# Patient Record
Sex: Male | Born: 1940 | Race: Black or African American | Hispanic: No | Marital: Married | Smoking: Former smoker
Health system: Southern US, Community
[De-identification: ages and names within clinical notes are randomized; demographics above are authoritative.]

## PROBLEM LIST (undated history)

## (undated) DIAGNOSIS — G309 Alzheimer's disease, unspecified: Secondary | ICD-10-CM

## (undated) DIAGNOSIS — F0280 Dementia in other diseases classified elsewhere without behavioral disturbance: Secondary | ICD-10-CM

## (undated) DIAGNOSIS — K219 Gastro-esophageal reflux disease without esophagitis: Secondary | ICD-10-CM

## (undated) DIAGNOSIS — N400 Enlarged prostate without lower urinary tract symptoms: Secondary | ICD-10-CM

## (undated) DIAGNOSIS — J189 Pneumonia, unspecified organism: Secondary | ICD-10-CM

## (undated) DIAGNOSIS — I82409 Acute embolism and thrombosis of unspecified deep veins of unspecified lower extremity: Secondary | ICD-10-CM

## (undated) DIAGNOSIS — J9601 Acute respiratory failure with hypoxia: Secondary | ICD-10-CM

## (undated) DIAGNOSIS — F329 Major depressive disorder, single episode, unspecified: Secondary | ICD-10-CM

## (undated) DIAGNOSIS — F32A Depression, unspecified: Secondary | ICD-10-CM

## (undated) DIAGNOSIS — I10 Essential (primary) hypertension: Secondary | ICD-10-CM

## (undated) HISTORY — PX: FRACTURE SURGERY: SHX138

## (undated) HISTORY — DX: Benign prostatic hyperplasia without lower urinary tract symptoms: N40.0

## (undated) HISTORY — DX: Major depressive disorder, single episode, unspecified: F32.9

## (undated) HISTORY — DX: Essential (primary) hypertension: I10

## (undated) HISTORY — PX: TRANSURETHRAL RESECTION OF PROSTATE: SHX73

## (undated) HISTORY — DX: Depression, unspecified: F32.A

---

## 1999-06-22 ENCOUNTER — Emergency Department (HOSPITAL_COMMUNITY): Admission: EM | Admit: 1999-06-22 | Discharge: 1999-06-22 | Payer: Self-pay | Admitting: *Deleted

## 2001-08-14 ENCOUNTER — Emergency Department (HOSPITAL_COMMUNITY): Admission: EM | Admit: 2001-08-14 | Discharge: 2001-08-14 | Payer: Self-pay | Admitting: Emergency Medicine

## 2002-01-05 ENCOUNTER — Ambulatory Visit (HOSPITAL_COMMUNITY): Admission: RE | Admit: 2002-01-05 | Discharge: 2002-01-05 | Payer: Self-pay | Admitting: *Deleted

## 2002-09-11 ENCOUNTER — Encounter: Admission: RE | Admit: 2002-09-11 | Discharge: 2002-09-11 | Payer: Self-pay | Admitting: Internal Medicine

## 2002-09-11 ENCOUNTER — Encounter: Payer: Self-pay | Admitting: Internal Medicine

## 2002-11-06 ENCOUNTER — Other Ambulatory Visit: Admission: RE | Admit: 2002-11-06 | Discharge: 2002-11-06 | Payer: Self-pay | Admitting: Urology

## 2002-11-06 ENCOUNTER — Encounter (INDEPENDENT_AMBULATORY_CARE_PROVIDER_SITE_OTHER): Payer: Self-pay | Admitting: *Deleted

## 2003-02-22 ENCOUNTER — Encounter: Admission: RE | Admit: 2003-02-22 | Discharge: 2003-02-22 | Payer: Self-pay | Admitting: Urology

## 2003-02-22 ENCOUNTER — Encounter: Payer: Self-pay | Admitting: Urology

## 2003-02-28 ENCOUNTER — Encounter (INDEPENDENT_AMBULATORY_CARE_PROVIDER_SITE_OTHER): Payer: Self-pay | Admitting: Specialist

## 2003-02-28 ENCOUNTER — Ambulatory Visit (HOSPITAL_BASED_OUTPATIENT_CLINIC_OR_DEPARTMENT_OTHER): Admission: RE | Admit: 2003-02-28 | Discharge: 2003-02-28 | Payer: Self-pay | Admitting: Urology

## 2007-09-21 ENCOUNTER — Emergency Department (HOSPITAL_COMMUNITY): Admission: EM | Admit: 2007-09-21 | Discharge: 2007-09-21 | Payer: Self-pay | Admitting: Emergency Medicine

## 2007-09-24 ENCOUNTER — Ambulatory Visit: Payer: Self-pay | Admitting: Internal Medicine

## 2007-09-24 ENCOUNTER — Inpatient Hospital Stay (HOSPITAL_COMMUNITY): Admission: EM | Admit: 2007-09-24 | Discharge: 2007-09-25 | Payer: Self-pay | Admitting: Emergency Medicine

## 2007-10-05 ENCOUNTER — Ambulatory Visit: Payer: Self-pay | Admitting: Hospitalist

## 2007-10-05 ENCOUNTER — Encounter (INDEPENDENT_AMBULATORY_CARE_PROVIDER_SITE_OTHER): Payer: Self-pay | Admitting: Infectious Diseases

## 2007-10-05 DIAGNOSIS — N400 Enlarged prostate without lower urinary tract symptoms: Secondary | ICD-10-CM | POA: Insufficient documentation

## 2007-10-05 DIAGNOSIS — F339 Major depressive disorder, recurrent, unspecified: Secondary | ICD-10-CM | POA: Insufficient documentation

## 2007-10-05 DIAGNOSIS — I10 Essential (primary) hypertension: Secondary | ICD-10-CM | POA: Insufficient documentation

## 2007-10-05 DIAGNOSIS — N3000 Acute cystitis without hematuria: Secondary | ICD-10-CM

## 2007-10-05 LAB — CONVERTED CEMR LAB
CO2: 24 meq/L (ref 19–32)
Calcium: 9 mg/dL (ref 8.4–10.5)
Sodium: 135 meq/L (ref 135–145)

## 2008-09-06 ENCOUNTER — Ambulatory Visit: Payer: Self-pay | Admitting: Surgery

## 2008-09-06 ENCOUNTER — Encounter (INDEPENDENT_AMBULATORY_CARE_PROVIDER_SITE_OTHER): Payer: Self-pay | Admitting: Emergency Medicine

## 2008-09-06 ENCOUNTER — Emergency Department (HOSPITAL_COMMUNITY): Admission: EM | Admit: 2008-09-06 | Discharge: 2008-09-06 | Payer: Self-pay | Admitting: Emergency Medicine

## 2010-05-18 ENCOUNTER — Emergency Department (HOSPITAL_COMMUNITY): Admission: EM | Admit: 2010-05-18 | Discharge: 2010-05-18 | Payer: Self-pay | Admitting: Emergency Medicine

## 2011-03-18 ENCOUNTER — Other Ambulatory Visit: Payer: Self-pay | Admitting: Dermatology

## 2011-05-01 NOTE — Op Note (Signed)
   NAME:  Iannelli, DHILLON COMUNALE                       ACCOUNT NO.:  [id number]   MEDICAL RECORD NO.:  [id number]                   PATIENT TYPE:  AMB   LOCATION:  [facility]                                 FACILITY:  [facility]   PHYSICIAN:  Rozanna Boer., M.D.      DATE OF BIRTH:  24-Feb-1941   DATE OF PROCEDURE:  02/28/2003  DATE OF DISCHARGE:                                 OPERATIVE REPORT   PREOPERATIVE DIAGNOSIS:  Hematuria with abnormal cytology and inflammatory  lesion, left bladder diverticulum.   POSTOPERATIVE DIAGNOSES:  1. Hematuria with abnormal cytology and inflammatory lesion, left bladder     diverticulum.  2. Mid-trigone posterior base inflammation.   PROCEDURE:  Transurethral resection of bladder tumor (2-3 cm left bladder  diverticulum), and fulguration of midposterior base inflammation.   ANESTHESIA:  General.   SURGEON:  Courtney Paris, M.D.   BRIEF HISTORY:  A 70 year old black male who was admitted with microscopic  hematuria and has had some pain in his groin off and on for some time.  He  has had some class II cytologies, on cystoscopy has a markedly inflamed  lesion inside a large bladder diverticulum on the left side of the bladder.  He enters to have this biopsied at this time.   DESCRIPTION OF PROCEDURE:  The patient was placed on the operating table in  the dorsal lithotomy position, prepped and draped with Betadine in the usual  sterile fashion.  Anterior urethra was inspected and no strictures seen.  Posterior urethra was nonobstructing.  The bladder was entered.  The bladder  did have an area in the midposterior base that was somewhat erythematous.  Pictures were made of this as well as the left-sided bladder diverticulum  that was just lateral to the left orifice.  There was some bullous edema  inside the lower lip of the diverticulum which was also photographed, as  well as the lesion in the posterior base.  Using the cold cup biopsy  forceps, five or six biopsies over about 2-3 cm were taken of this  diverticulum, and the area in the posterior base was then fulgurated as well  as the diverticulum.  Hemostasis appeared to be adequate, and a Foley  catheter was inserted, a #16 [place], and left to straight drainage when the  irrigant was clear.  The patient was then taken to the recovery room and  will be discharged with detailed written instructions as an outpatient.                                                Rozanna Boer., M.D.    HMK/MEDQ  D:  02/28/2003  T:  02/28/2003  Job:  [id number]

## 2011-05-01 NOTE — Procedures (Signed)
[place]  Patient:    DAEJON, LICH Visit Number: [id number] MRN: [id number]          Service Type: OUT Location: [facility] Attending Physician:  Enos Fling Dictated by:   Candy Sledge, M.D. Proc. Date: 01/05/02 Admit Date:  01/05/2002                             Procedure Report  PROCEDURE:  Lumbar puncture.  INDICATION:  Rule out Alzheimers disease.  DESCRIPTION OF PROCEDURE:  After sterile preparation of local anesthesia at the L2-3 interspace, a 20 gauge spinal needle was introduced without difficulty producing clear CSF at a opening pressure of 170 mm H20. Approximately 8 cc. were collected for routine studies with AB-42 and TAU- protein.  The patient tolerated the procedure well and without any immediate complications. Dictated by:   Candy Sledge, M.D. Attending Physician:  Enos Fling DD:  01/05/02 TD:  01/06/02 Job: [id number]

## 2011-05-01 NOTE — Discharge Summary (Signed)
NAME:  Christopher Summers, Christopher Summers NO.:  [id number]   MEDICAL RECORD NO.:  [id number]          PATIENT TYPE:  INP   LOCATION:  5742                         FACILITY:  [facility]   PHYSICIAN:  Madaline Guthrie, M.D.    DATE OF BIRTH:  03/29/1941   DATE OF ADMISSION:  09/24/2007  DATE OF DISCHARGE:  09/25/2007                               DISCHARGE SUMMARY   DISCHARGE DIAGNOSES:  1. Pyelonephritis.  2. Lower extremity arthralgia and myalgia in the left lower leg in      addition to the pain in the right elbow.  3. Altered mental status secondary to Ativan and fentanyl in the ED.  4. Alzheimer disease.  5. Benign prostatic hyperplasia.  6. Depression.  7. Hypertension.  8. resolving furuncle on the right gluteus maximus.   DISCHARGE MEDICATIONS:  1. Citalopram 20 mg p.o. daily.  2. Seroquel 25 mg twice daily.  3. Terazosin 5 mg daily.  4. Galantamine 8 mg 2 tablets once daily.  5. Amatine 10 mg once daily.  6. Lisinopril 5 mg once daily.  7. Bactrim DS 800/160 mg twice daily by mouth.  8. Ciprofloxacin 500 mg twice daily by mouth for 10 days.  9. Tylenol 500 mg one to two tablets by mouth every 4-6 hours as      needed.  10.Ibuprofen 400 mg p.o. every 8 hours as needed for pain.   HOSPITAL FOLLOWUP:  The patient will follow up at the [facility] and the clinic will call the patient with appointment  date.  It will be in the next 2 weeks.  He needs to be followed up for  his acute myalgias and arthralgias in addition to the resolution of his  UTI.   PROCEDURES PERFORMED:  1. Chest x-ray indicating clear lungs.  No pleural effusion and normal      heart size.  Four-view x-ray of the lumbar spine indicating      vertebral patent alignment, which are maintained.  Some end-plate      sclerosis at L1-L2 and L2-L3 anteriorly.  Pars anterior articular      defect noted.  2. Two view of the left ankle performed on September 25, 2007,      indicating no  obvious fractures or dislocations and soft tissues      within normal limits.  3. Two view of right wrist indicating no acute finding or injury      including fracture or dislocation.  Soft tissues are again      unremarkable.  4. Two view of the right elbow indicating small avulsion fragments      from the lateral epicondyle of intermediate age.   CONSULTATIONS:  None.   BRIEF ADMITTING HISTORY AND PHYSICAL:  This is a 70 year old male with  history of Alzheimer dementia and BPH, who comes in with a recent  history of fever to 101.7 in the last 24 hours. He also is complaining  of right upper extremity and left lower extremity pain of 3-4 days'  duration.  The patient and his wife are not sure how the pain started.  His wife indicates that she thinks that it occurred while he was  dragging and picking up logs in their backyard recently.  The right  upper quadrant pain is localized to his right upper arm and right wrist,  has not been associated with any recent swelling or obvious deformity.  His left lower extremity pain is localized to his left thigh and left  ankle and again it is not associated with any recent swelling or obvious  deformities to the left knee joint or ankle.  Additionally, the patient  has noticed recent fevers and night sweats in the last 2 days.  He  denies any chest pain, shortness of breath, coughing, nausea, or  vomiting.  No changes in his bowels or abdominal pain or urinary  frequency.  He has had occasional dysuria for the last few days.   ADMITTING PHYSICAL EXAMINATION:  VITAL SIGNS:  Temperature 98.6, blood  pressure 121/81, pulse 96, respirations 16, and satting 96% on room air.  GENERAL:  The patient is somnolent, but arousable to voice.  HEENT:  Oropharynx is clear of any erythema or exudates.  NECK:  Negative for JVD or cervical lymphadenopathy.  RESPIRATORY:  Lungs are clear bilaterally with no sensory muscle use.  CARDIOVASCULAR:  Regular rate and  rhythm.  A 2/6 diastolic murmur heard  best at the right upper sternal border.  GI:  Abdomen is soft, nontender, and nondistended.  Positive for bowel  sounds.  EXTREMITIES:  There is tenderness on the left lateral surface of the  knee, the left knee in addition to some tenderness on the left ankle.  There is no obvious swelling and range of motion is normal within these  joint.  SKIN:  Overlying the right gluteus maximus has a scaling rash that  appears to be an old furuncle.  EXTREMITIES:  Muscle strength +5 in all extremities.  NEUROLOGIC:  The patient is only oriented to name, not to time or place.  He is not following commands at this time.  DTRs are +2 in lower  extremities.  Babinski sign normal bilaterally.  Cranial nerves  difficult to assess because the patient is not currently following  commands.   ADMITTING LABORATORIES:  Sodium 133, potassium 3.9, chloride 95,  bicarbonate 27, BUN 12, creatinine 1.1, glucose 117.  Hemoglobin 12.7,  white count 10.0, platelets 333,000.  ANC 8.0, anion gap of 11.  LFTs  within normal limits.  UA indicating rbc's too numerous count, white  blood cells of 11 to 20, positive for leukocytes, and negative for  nitrites.   HOSPITAL COURSE BY PROBLEM:  1. Recent fevers.  The patient has evidence of UTI on UA.  His      leukocyte esterase is positive.  He also had rbc's on his UA.  He      has had recent fevers, but denies any recent chills, CVA      tenderness, or suprapubic tenderness.  He has had some recent      dysuria.  We placed the patient on ciprofloxacin 500 mg IV b.i.d.      and obtained a urine culture prior to that.  The urine culture      eventually came back as insignificant growth, but we continued      ciprofloxacin on an outpatient basis 500 mg p.o. twice daily for 10      days.  He will need to be followed up in the outpatient clinic for      resolution of  his UTI.  2. Muscle pain.  The patient again on musculoskeletal exam  does not      have any obvious deformities including swelling, signs of      dislocation, or open wounds.  It is unclear how the patient's right      elbow, right wrist of left lower extremity were injured.  He has      Alzheimer's and is unable to tell us exactly how it started, and      his wife was not present when he first started complaining of the      pain.  The pain is very localized so we do not feel this is related      to a systemic disease, which urged [place] to get some imaging of his      right elbow, right wrist, in addition to left ankle.  His right      elbow plain film did show a small avulsion fracture of his right      lateral epicondyle, and we recommended the patient use ibuprofen,      Tylenol, ice, and heat to control the pain in that elbow.  We also      recommended Tylenol and ibuprofen for the myalgias and arthralgias      that he has in his other joints as well.  He feels this is an acute      process probably related to some type of recent injury. He will      follow up with his primary care physician on an-outpatient basis      for resolution of these symptoms.  3. Altered mental status.  The patient was not following commands upon      admission.  He does have Alzheimer's, but he has some basic      functioning and ability to take care of his ADLs at home.  He was      given Ativan and fentanyl in the ED, and it is likely that these 2      medications in combination produced the altered mental status exam      that we saw on exam.  His mental status cleared up within 24 hours.      and he was able to converse with [place].  He was able to follow      commands and according to his wife it is at baseline towards mental      acuity.  4. Alzheimer disease.  The patient was on Namenda and galantamine      during his admission.  We feel that these are appropriate for his      Alzheimer disease.  He was also on Seroquel for recent agitation      and according his wife has  been working very well.  5. BPH.  The patient has an enlarged prostate and has been treated      with terazosin.  The patient's urinary output was normal during the      admission, and we do not feel that he is currently obstructed.  We      will continue the terazosin on an-outpatient basis.  6. Diastolic murmur.  The patient had 2/6 diastolic murmur best heard      at the right upper sternal border.  He needs probably a 2D echo in      the future and this should be arranged on an outpatient basis by      his primary care physician.  DISCHARGE VITALS:  Temperature 99.7, blood pressure 137-144/74-82, pulse  104-107, respirations 20, and satting 95-97% on room air.   DISCHARGE LABORATORIES:  White count 7.6, hemoglobin 10.1.  Sodium 134,  potassium 3.8, chloride 98, bicarb 26, BUN 10, creatinine 1.15, glucose  103, and calcium 8.9.      Lollie Sails, MD  Electronically Signed      Madaline Guthrie, M.D.  Electronically Signed    CB/MEDQ  D:  10/25/2007  T:  10/26/2007  Job:  [id number]   cc:   Mudis Dr. Kem Kays

## 2011-06-03 ENCOUNTER — Inpatient Hospital Stay (INDEPENDENT_AMBULATORY_CARE_PROVIDER_SITE_OTHER)
Admission: RE | Admit: 2011-06-03 | Discharge: 2011-06-03 | Disposition: A | Discharge status: Home / Self Care | Payer: Medicare Other | Source: Ambulatory Visit | Attending: Emergency Medicine | Admitting: Emergency Medicine

## 2011-06-03 DIAGNOSIS — H109 Unspecified conjunctivitis: Secondary | ICD-10-CM

## 2011-09-24 LAB — URINE MICROSCOPIC-ADD ON

## 2011-09-24 LAB — COMPREHENSIVE METABOLIC PANEL
ALT: 21
Alkaline Phosphatase: 71
BUN: 12
CO2: 27
Creatinine, Ser: 1.1
GFR calc Af Amer: >60
GFR calc non Af Amer: >60
Glucose, Bld: 117 — ABNORMAL HIGH
Potassium: 3.9
Total Bilirubin: 1.5 — ABNORMAL HIGH

## 2011-09-24 LAB — BASIC METABOLIC PANEL
BUN: 10
Calcium: 8.9
Chloride: 98
Creatinine, Ser: 1.15
GFR calc Af Amer: >60
GFR calc non Af Amer: >60
Potassium: 3.8

## 2011-09-24 LAB — CBC
HCT: 28.5 — ABNORMAL LOW
Hemoglobin: 10.1 — ABNORMAL LOW
Hemoglobin: 12.7 — ABNORMAL LOW
MCHC: 34.1
MCV: 91.6
MCV: 93.4
RDW: 13.2
RDW: 13.5
WBC: 10
WBC: 7.6

## 2011-09-24 LAB — LIPID PANEL
Cholesterol: 90
HDL: 25 — ABNORMAL LOW
LDL Cholesterol: 45
Triglycerides: 100

## 2011-09-24 LAB — URINE CULTURE: Colony Count: 6000

## 2011-09-24 LAB — URINALYSIS, ROUTINE W REFLEX MICROSCOPIC
Glucose, UA: NEGATIVE
Nitrite: NEGATIVE
Protein, ur: 30 — AB
Specific Gravity, Urine: 1.029
Urobilinogen, UA: 1

## 2011-09-24 LAB — DIFFERENTIAL
Basophils Absolute: 0
Basophils Relative: 0
Eosinophils Absolute: 0
Monocytes Absolute: 0.8 — ABNORMAL HIGH
Neutrophils Relative %: 80 — ABNORMAL HIGH

## 2011-09-24 LAB — CULTURE, BLOOD (ROUTINE X 2)

## 2012-01-29 ENCOUNTER — Ambulatory Visit (INDEPENDENT_AMBULATORY_CARE_PROVIDER_SITE_OTHER): Payer: Medicare Other | Admitting: Internal Medicine

## 2012-01-29 ENCOUNTER — Encounter: Payer: Self-pay | Admitting: Internal Medicine

## 2012-01-29 DIAGNOSIS — L03039 Cellulitis of unspecified toe: Secondary | ICD-10-CM

## 2012-01-29 DIAGNOSIS — L03031 Cellulitis of right toe: Secondary | ICD-10-CM

## 2012-01-29 DIAGNOSIS — L600 Ingrowing nail: Secondary | ICD-10-CM

## 2012-01-29 MED ORDER — CEPHALEXIN 500 MG PO CAPS: 500.0000 mg | ORAL_CAPSULE | Freq: Two times a day (BID) | ORAL | Status: AC

## 2012-01-29 MED ORDER — KETOCONAZOLE 2 % EX CREA: TOPICAL_CREAM | CUTANEOUS | Status: DC

## 2012-01-29 NOTE — Patient Instructions (Signed)
  Soak toe twice a day in saline. The saline can be purchased at the drugstore or you can make your own .Boil cup of salt in a gallon of water. Store mixture  in a clean container.Report Warning  signs as discussed (red streaks, pus, fever, increasing pain).  Nizoral applied topically and blown  dry with hair dryer after saline soaks twice a day.  

## 2012-01-29 NOTE — Progress Notes (Signed)
  Subjective:    Patient ID: Christopher Summers, male    DOB: Mar 21, 1941, 71 y.o.   MRN: [id number]  HPI Extremity pain Location:R great toe Onset:1 month Trigger/injury:no Pain quality:sore with direct pressure Pain severity:up to 1 Duration:consatant Radiation:no Exacerbating factors:none Treatment/response:none Review of systems: Constitutional: no fever, chills, sweats, change in weight  Musculoskeletal:no  muscle cramps or pain; no  joint stiffness, redness, or swelling Skin:no rash, color change    Review of Systems he denies diabetes       Objective:   Physical Exam   He appears adequately nourished and in no acute distress  Pedal pulses are intact.  He has no clubbing cyanosis or edema.  There is mild hyperpigmentation along the lateral nail of the right great toe with clinical mild onychomycosis and paronychia.        Assessment & Plan:    #1 paronychia  with mild onychomycosis  See recommendations and orders.

## 2012-12-25 ENCOUNTER — Encounter (HOSPITAL_COMMUNITY): Payer: Self-pay | Admitting: Emergency Medicine

## 2012-12-25 ENCOUNTER — Emergency Department (HOSPITAL_COMMUNITY)
Admission: EM | Admit: 2012-12-25 | Discharge: 2012-12-25 | Disposition: A | Discharge status: Home / Self Care | Payer: Medicare Other | Source: Home / Self Care | Attending: Emergency Medicine | Admitting: Emergency Medicine

## 2012-12-25 ENCOUNTER — Emergency Department (INDEPENDENT_AMBULATORY_CARE_PROVIDER_SITE_OTHER): Payer: Medicare Other

## 2012-12-25 DIAGNOSIS — L259 Unspecified contact dermatitis, unspecified cause: Secondary | ICD-10-CM

## 2012-12-25 DIAGNOSIS — M79672 Pain in left foot: Secondary | ICD-10-CM

## 2012-12-25 DIAGNOSIS — M79609 Pain in unspecified limb: Secondary | ICD-10-CM

## 2012-12-25 DIAGNOSIS — L0390 Cellulitis, unspecified: Secondary | ICD-10-CM

## 2012-12-25 MED ORDER — TRAMADOL HCL 50 MG PO TABS: 50.0000 mg | ORAL_TABLET | Freq: Four times a day (QID) | ORAL | Status: DC | PRN

## 2012-12-25 MED ORDER — ACETAMINOPHEN ER 650 MG PO TBCR: 650.0000 mg | EXTENDED_RELEASE_TABLET | Freq: Three times a day (TID) | ORAL | Status: DC | PRN

## 2012-12-25 NOTE — ED Provider Notes (Signed)
History     CSN: [id number]  Arrival date & time 12/25/12  1112   First MD Initiated Contact with Patient 12/25/12 1147      Chief Complaint  Patient presents with  . Foot Pain    acute left foot pain that started on friday. severe soreness felt on lateral and top/bottom of foot.     (Consider location/radiation/quality/duration/timing/severity/associated sxs/prior treatment) Patient is a 72 y.o. male presenting with lower extremity pain. The history is provided by the patient.  Foot Pain This is a new problem. The current episode started 2 days ago. The problem occurs daily. The problem has been gradually worsening. The symptoms are aggravated by walking. Nothing relieves the symptoms. He has tried nothing for the symptoms.   Christopher Summers is a 72 y.o. male who complains of left foot pain for 2 days.  No known injury, reports symptoms have been ongoing.  This morning patient reports unable to walk on foot without pain.  Denies prior history of related problems.  No medications taken for pain.    Past Medical History  Diagnosis Date  . BPH (benign prostatic hypertrophy)   . Depression   . Essential hypertension, benign     Past Surgical History  Procedure Date  . Transurethral resection of prostate     History reviewed. No pertinent family history.  History  Substance Use Topics  . Smoking status: Current Every Day Smoker -- 0.3 packs/day    Types: Cigarettes  . Smokeless tobacco: Not on file  . Alcohol Use: No      Review of Systems  Musculoskeletal: Positive for arthralgias.  All other systems reviewed and are negative.    Allergies  Niacin and related and Terazosin  Home Medications   Current Outpatient Rx  Name  Route  Sig  Dispense  Refill  . CITALOPRAM HYDROBROMIDE 40 MG PO TABS   Oral   Take 40 mg by mouth daily.         . DONEPEZIL HCL 10 MG PO TABS   Oral   Take 10 mg by mouth at bedtime as needed.         Marland Kitchen HYDROXYZINE HCL 10 MG PO  TABS   Oral   Take 10 mg by mouth 3 (three) times daily as needed.         Marland Kitchen MEMANTINE HCL 10 MG PO TABS   Oral   Take 10 mg by mouth daily.         Marland Kitchen RANITIDINE HCL 300 MG PO TABS   Oral   Take 300 mg by mouth at bedtime.         . ACETAMINOPHEN ER 650 MG PO TBCR   Oral   Take 1 tablet (650 mg total) by mouth every 8 (eight) hours as needed for pain.   30 tablet   0   . GALANTAMINE HYDROBROMIDE ER 24 MG PO CP24   Oral   Take 24 mg by mouth daily with breakfast.         . KETOCONAZOLE 2 % EX CREA      Apply bid & blow dry   15 g   0   . TRAMADOL HCL 50 MG PO TABS   Oral   Take 1 tablet (50 mg total) by mouth every 6 (six) hours as needed for pain.   30 tablet   0     BP 165/88  Pulse 73  Temp 97.5 F (36.4 C) (Oral)  SpO2  99%  Physical Exam  Nursing note and vitals reviewed. Constitutional: He is oriented to person, place, and time. Vital signs are normal. He appears well-developed and well-nourished. He is active and cooperative.  HENT:  Head: Normocephalic.  Eyes: Conjunctivae normal are normal. Pupils are equal, round, and reactive to light. No scleral icterus.  Neck: Trachea normal. Neck supple.  Cardiovascular: Normal rate and regular rhythm.   Pulmonary/Chest: Effort normal and breath sounds normal.  Musculoskeletal: Normal range of motion.       Left knee: Normal.       Right ankle: Normal.       Left ankle: Normal.       Left foot: He exhibits tenderness. He exhibits normal range of motion, no bony tenderness, no swelling, normal capillary refill, no crepitus, no deformity and no laceration.       Feet:       Tenderness on plantar surface of left foot.  Neurological: He is alert and oriented to person, place, and time. He has normal strength. No cranial nerve deficit or sensory deficit. Coordination and gait normal. GCS eye subscore is 4. GCS verbal subscore is 5. GCS motor subscore is 6.  Skin: Skin is warm and dry.  Psychiatric: He has a  normal mood and affect. His speech is normal and behavior is normal. Judgment and thought content normal. Cognition and memory are normal.    ED Course  Procedures (including critical care time)  Labs Reviewed - No data to display Dg Foot Complete Left  12/25/2012  *RADIOLOGY REPORT*  Clinical Data: Foot pain  LEFT FOOT - COMPLETE 3+ VIEW  Comparison: None.  Findings: Normal alignment without acute fracture.  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  Preserved joint spaces.  No definite soft tissue abnormality.  Slight degenerative changes of the left first MTP joint.  IMPRESSION: No acute osseous finding.  Chronic changes as described.   Original Report Authenticated By: Judie Petit. Shick, M.D.      1. Foot pain, left       MDM  Ace wrap, replace shoes, tylenol arthritis as needed for pain.  Ultram for worse pain.          Johnsie Kindred, NP 12/25/12 1221

## 2012-12-25 NOTE — ED Notes (Signed)
Pt c/o acute left foot pain that started on Friday. Severe soreness is felt on the lateral side of foot up into the ankle and pain in bottom center of foot and top side. Pt denies injury.   Pt hx borderline diabetic.

## 2012-12-25 NOTE — ED Provider Notes (Signed)
Medical screening examination/treatment/procedure(s) were performed by non-physician practitioner and as supervising physician I was immediately available for consultation/collaboration.  Leslee Home, M.D.   Reuben Likes, MD 12/25/12 [phone]

## 2013-01-05 ENCOUNTER — Encounter (HOSPITAL_COMMUNITY): Payer: Self-pay | Admitting: *Deleted

## 2013-01-05 ENCOUNTER — Emergency Department (HOSPITAL_COMMUNITY)
Admission: EM | Admit: 2013-01-05 | Discharge: 2013-01-05 | Disposition: A | Discharge status: Home / Self Care | Payer: Medicare Other | Source: Home / Self Care | Attending: Emergency Medicine | Admitting: Emergency Medicine

## 2013-01-05 DIAGNOSIS — L309 Dermatitis, unspecified: Secondary | ICD-10-CM

## 2013-01-05 DIAGNOSIS — L0390 Cellulitis, unspecified: Secondary | ICD-10-CM

## 2013-01-05 DIAGNOSIS — L259 Unspecified contact dermatitis, unspecified cause: Secondary | ICD-10-CM

## 2013-01-05 DIAGNOSIS — L0291 Cutaneous abscess, unspecified: Secondary | ICD-10-CM

## 2013-01-05 MED ORDER — TRIAMCINOLONE ACETONIDE 0.1 % EX CREA: TOPICAL_CREAM | Freq: Three times a day (TID) | CUTANEOUS | Status: DC

## 2013-01-05 MED ORDER — MUPIROCIN 2 % EX OINT: TOPICAL_OINTMENT | Freq: Three times a day (TID) | CUTANEOUS | Status: DC

## 2013-01-05 MED ORDER — CEPHALEXIN 500 MG PO CAPS: 500.0000 mg | ORAL_CAPSULE | Freq: Three times a day (TID) | ORAL | Status: DC

## 2013-01-05 NOTE — ED Provider Notes (Addendum)
Chief Complaint  Patient presents with  . Wound Infection    History of Present Illness:   Christopher Summers is a 72 year old male with Alzheimer's disease who was normally followed at the [facility]. He has a history of a rash on his legs which has been diagnosed by Dr. Janalyn Harder as being eczema. It's been scaly and he's been itching at it continuously. For the past 4 days there has been redness and swelling over the right pretibial surface. He denies any fever or chills. There's been no drainage, numbness, tingling, or weakness of the leg.  Review of Systems:  Other than noted above, the patient denies any of the following symptoms: Systemic:  No fevers, chills, sweats, or aches.  No fatigue or tiredness. Musculoskeletal:  No joint pain, arthritis, bursitis, swelling, back pain, or neck pain. Neurological:  No muscular weakness, paresthesias, headache, or trouble with speech or coordination.  No dizziness.  PMFSH:  Past medical history, family history, social history, meds, and allergies were reviewed.  Physical Exam:   Vital signs:  BP 162/80  Pulse 58  Temp 97.9 F (36.6 C) (Oral)  Resp 16  SpO2 96% Gen:  Alert and oriented times 3.  In no distress. Musculoskeletal: He has a plaque of eczematous is area on his right pretibial surface. At the superior end of this there was an area of erythema measuring 3 x 3 cm in diameter. This was very tender to palpation but not fluctuant and not draining any pus.  Otherwise, all joints had a full a ROM with no swelling, bruising or deformity.  No edema, pulses full. Extremities were warm and pink.  Capillary refill was brisk.  Skin:  Clear, warm and dry.  No rash. Neuro:  Alert and oriented times 3.  Muscle strength was normal.  Sensation was intact to light touch.   Assessment:  The primary encounter diagnosis was Cellulitis. A diagnosis of Eczema was also pertinent to this visit.  Plan:   1.  The following meds were prescribed:   New  Prescriptions   CEPHALEXIN (KEFLEX) 500 MG CAPSULE    Take 1 capsule (500 mg total) by mouth 3 (three) times daily.   MUPIROCIN OINTMENT (BACTROBAN) 2 %    Apply topically 3 (three) times daily.   TRIAMCINOLONE CREAM (KENALOG) 0.1 %    Apply topically 3 (three) times daily.   2.  The patient was instructed in symptomatic care, including rest and activity, elevation, application of ice and compression.  Appropriate handouts were given. 3.  The patient was told to return if becoming worse in any way, if no better in 3 or 4 days, and given some red flag symptoms that would indicate earlier return.   4.  The patient was told to follow up in 48 hours if no improvement.    Reuben Likes, MD 01/05/13 1612  Reuben Likes, MD 01/08/13 [phone]

## 2013-01-05 NOTE — ED Notes (Signed)
Pt is here with complaints of right shin wound.  Caregiver reports pt has been being treated for this by a Dermatologist for some time and was provided with an unknown cream.  Caregiver reports wound appears increasingly red and swollen.

## 2014-06-25 ENCOUNTER — Emergency Department (HOSPITAL_COMMUNITY)
Admission: EM | Admit: 2014-06-25 | Discharge: 2014-06-25 | Disposition: A | Discharge status: Home / Self Care | Payer: Medicare Other | Source: Home / Self Care | Attending: Emergency Medicine | Admitting: Emergency Medicine

## 2014-06-25 ENCOUNTER — Encounter (HOSPITAL_COMMUNITY): Payer: Self-pay | Admitting: Emergency Medicine

## 2014-06-25 DIAGNOSIS — L280 Lichen simplex chronicus: Secondary | ICD-10-CM

## 2014-06-25 MED ORDER — TRIAMCINOLONE ACETONIDE 0.1 % EX CREA
1.0000 | TOPICAL_CREAM | Freq: Three times a day (TID) | CUTANEOUS | Status: DC
Start: 2014-06-25 — End: 2014-10-08

## 2014-06-25 MED ORDER — PREDNISONE 20 MG PO TABS: 20.0000 mg | ORAL_TABLET | Freq: Two times a day (BID) | ORAL | Status: DC

## 2014-06-25 MED ORDER — CEPHALEXIN 500 MG PO CAPS: 500.0000 mg | ORAL_CAPSULE | Freq: Three times a day (TID) | ORAL | Status: DC

## 2014-06-25 MED ORDER — METHYLPREDNISOLONE ACETATE 80 MG/ML IJ SUSP
INTRAMUSCULAR | Status: AC
  Filled 2014-06-25: qty 1

## 2014-06-25 MED ORDER — METHYLPREDNISOLONE ACETATE 80 MG/ML IJ SUSP
80.0000 mg | Freq: Once | INTRAMUSCULAR | Status: AC
  Administered 2014-06-25: 80 mg via INTRAMUSCULAR

## 2014-06-25 MED ORDER — HYDROXYZINE HCL 10 MG PO TABS
10.0000 mg | ORAL_TABLET | Freq: Three times a day (TID) | ORAL | Status: DC | PRN
Start: 2014-06-25 — End: 2014-10-08

## 2014-06-25 NOTE — Discharge Instructions (Signed)
Eczema has been described as "the itch that rashes."  The primary problem is inflammation of the skin, this is followed by the irresistible urge to scratch.  The scratching is what causes the rash.   ° °Eczema is thought to be hereditary.  It is often accompanied by other inflammatory diseases such as asthma or hay fever.  There are certain environmental things that may make it worse such as dryness of the skin, wool clothing, infection, certain allergens, and stress.  It is important to recognize that it is not caused by stress, and the search for an allergic cause is not helpful. ° °While there is no cure for eczema, it can be controlled with prescription medication and lifestyle changes. Suggestions follow for successful control of eczema: ° °· Avoid harsh soaps.  Use Dove, Cetaphil, Neutrogena, Aveeno.  Do not use Ivory. °· Take infrequent baths or showers, use luke warm water.  Get in and out of the bath or shower as quickly as possible.  Do not scrub the skin. Pat the skin dry after bathing.   °· Immediately after bathing apply a skin moisturizer such as Aquaphor, Vaseline, Eucerin, Cetaphil, Nutraderm, or Nivea. These should be applied 2 times a day, immediately after bathing and after all hand washing.  °· Use unscented laundry detergent such as Cheer-free, All-free, or unscented Bounce since these have no perfumes or preservatives.  °· Keep thermostat as low as possible in winter.  Consider a humidifier in bedroom.   °· Avoid wool clothing, blended or synthetic fabric or shirt collar tags--use  100% cotton clothing instead.  °· Avoid scratching.  Try using an ice cube on  Itchy area.  °· May use antihistamines such as Benadryl, Zyrtec, Allegra, or Claritin for itching.  °· Treat skin infections immediately. °· Eczema is a chronic and recurring condition.  You should be followed by a primary care doctor or a dermatology specialist or both.  ° ° ° °

## 2014-06-25 NOTE — ED Notes (Signed)
Wife brings in demented spouse who has had problem w skin bilateral lower legs x 1 year + ; usually goes to the [place] for his medical issues

## 2014-06-25 NOTE — ED Provider Notes (Signed)
  Chief Complaint    Chief Complaint  Patient presents with  . Skin Problem    History of Present Illness      Christopher Summers is a 73 year old male with Alzheimer's disease who has a two-year history of irritated areas on his left medial knee and right pretibial surface. He states these are itchy and he scratches at them. His wife thinks that the scratching makes them worse. She can't get him to stop scratching at them. She has tried creams and antihistamine pills which help for a while, but the skin lesions always seem to come back again.  Review of Systems   Other than as noted above, the patient denies any of the following symptoms: Systemic:  No fever or chills. ENT:  No nasal congestion, rhinorrhea, sore throat, swelling of lips, tongue or throat. Resp:  No cough, wheezing, or shortness of breath.  [place]    Past medical history, family history, social history, meds, and allergies were reviewed.   Physical Exam     Vital signs:  BP 158/82  Pulse 62  Temp(Src) 98 F (36.7 C) (Oral)  Resp 14  SpO2 95% Gen:  Alert, oriented, in no distress. ENT:  Pharynx clear, no intraoral lesions, moist mucous membranes. Lungs:  Clear to auscultation. Skin:  He has 2 large eczematous area, one on the medial left knee and one on the pretibial surface. These do not appear to be infected.  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  Plan     1.  Meds:  The following meds were prescribed:   Discharge Medication List as of 06/25/2014 10:39 AM    START taking these medications   Details  !! cephALEXin (KEFLEX) 500 MG capsule Take 1 capsule (500 mg total) by mouth 3 (three) times daily., Starting 06/25/2014, Until Discontinued, Normal    !! hydrOXYzine (ATARAX/VISTARIL) 10 MG tablet  Take 1 tablet (10 mg total) by mouth 3 (three) times daily as needed., Starting 06/25/2014, Until Discontinued, Normal    predniSONE (DELTASONE) 20 MG tablet Take 1 tablet (20 mg total) by mouth 2 (two) times daily., Starting 06/25/2014, Until Discontinued, Normal    !! triamcinolone cream (KENALOG) 0.1 % Apply 1 application topically 3 (three) times daily., Starting 06/25/2014, Until Discontinued, Normal     !! - Potential duplicate medications found. Please discuss with provider.      2.  Patient Education/Counseling:  The patient was given appropriate handouts, self care instructions, and instructed in symptomatic relief.  He will need followup with dermatology.  3.  Follow up:  The patient was told to follow up here if no better in 3 to 4 days, or sooner if becoming worse in any way, and given some red flag symptoms such as worsening rash, fever, or difficulty breathing which would prompt immediate return.  Follow up here if necessary.      Harden Mo, MD 06/25/14 1452

## 2014-10-08 ENCOUNTER — Inpatient Hospital Stay (HOSPITAL_COMMUNITY)
Admission: EM | Admit: 2014-10-08 | Discharge: 2014-10-11 | DRG: 981 | Disposition: A | Discharge status: Skilled Nursing Facility | Payer: Medicare Other | Attending: Internal Medicine | Admitting: Internal Medicine

## 2014-10-08 ENCOUNTER — Emergency Department (HOSPITAL_COMMUNITY): Payer: Medicare Other

## 2014-10-08 ENCOUNTER — Encounter (HOSPITAL_COMMUNITY): Payer: Self-pay | Admitting: Emergency Medicine

## 2014-10-08 DIAGNOSIS — F17210 Nicotine dependence, cigarettes, uncomplicated: Secondary | ICD-10-CM | POA: Diagnosis present

## 2014-10-08 DIAGNOSIS — Z888 Allergy status to other drugs, medicaments and biological substances status: Secondary | ICD-10-CM

## 2014-10-08 DIAGNOSIS — M25559 Pain in unspecified hip: Secondary | ICD-10-CM

## 2014-10-08 DIAGNOSIS — W11XXXA Fall on and from ladder, initial encounter: Secondary | ICD-10-CM | POA: Diagnosis present

## 2014-10-08 DIAGNOSIS — S72001A Fracture of unspecified part of neck of right femur, initial encounter for closed fracture: Secondary | ICD-10-CM | POA: Diagnosis not present

## 2014-10-08 DIAGNOSIS — Z01818 Encounter for other preprocedural examination: Secondary | ICD-10-CM

## 2014-10-08 DIAGNOSIS — G309 Alzheimer's disease, unspecified: Secondary | ICD-10-CM | POA: Diagnosis not present

## 2014-10-08 DIAGNOSIS — W19XXXA Unspecified fall, initial encounter: Secondary | ICD-10-CM

## 2014-10-08 DIAGNOSIS — S72009A Fracture of unspecified part of neck of unspecified femur, initial encounter for closed fracture: Secondary | ICD-10-CM

## 2014-10-08 DIAGNOSIS — D62 Acute posthemorrhagic anemia: Secondary | ICD-10-CM | POA: Diagnosis not present

## 2014-10-08 DIAGNOSIS — F329 Major depressive disorder, single episode, unspecified: Secondary | ICD-10-CM | POA: Diagnosis present

## 2014-10-08 DIAGNOSIS — N400 Enlarged prostate without lower urinary tract symptoms: Principal | ICD-10-CM | POA: Diagnosis present

## 2014-10-08 DIAGNOSIS — F0391 Unspecified dementia with behavioral disturbance: Secondary | ICD-10-CM

## 2014-10-08 DIAGNOSIS — F02818 Dementia in other diseases classified elsewhere, unspecified severity, with other behavioral disturbance: Secondary | ICD-10-CM | POA: Diagnosis present

## 2014-10-08 DIAGNOSIS — M25551 Pain in right hip: Secondary | ICD-10-CM | POA: Diagnosis present

## 2014-10-08 DIAGNOSIS — I10 Essential (primary) hypertension: Secondary | ICD-10-CM | POA: Diagnosis present

## 2014-10-08 DIAGNOSIS — F0281 Dementia in other diseases classified elsewhere with behavioral disturbance: Secondary | ICD-10-CM | POA: Diagnosis present

## 2014-10-08 DIAGNOSIS — S0990XA Unspecified injury of head, initial encounter: Secondary | ICD-10-CM

## 2014-10-08 DIAGNOSIS — F03918 Unspecified dementia, unspecified severity, with other behavioral disturbance: Secondary | ICD-10-CM

## 2014-10-08 DIAGNOSIS — F0280 Dementia in other diseases classified elsewhere without behavioral disturbance: Secondary | ICD-10-CM | POA: Diagnosis present

## 2014-10-08 DIAGNOSIS — W19XXXD Unspecified fall, subsequent encounter: Secondary | ICD-10-CM

## 2014-10-08 DIAGNOSIS — Z9889 Other specified postprocedural states: Secondary | ICD-10-CM

## 2014-10-08 LAB — CBC WITH DIFFERENTIAL/PLATELET
BASOS ABS: 0 10*3/uL (ref 0.0–0.1)
BASOS PCT: 0 % (ref 0–1)
Eosinophils Absolute: 0 10*3/uL (ref 0.0–0.7)
Eosinophils Relative: 0 % (ref 0–5)
HCT: 42.9 % (ref 39.0–52.0)
HEMOGLOBIN: 13.9 g/dL (ref 13.0–17.0)
Lymphocytes Relative: 11 % — ABNORMAL LOW (ref 12–46)
Lymphs Abs: 1 10*3/uL (ref 0.7–4.0)
MCH: 29.7 pg (ref 26.0–34.0)
MCHC: 32.4 g/dL (ref 30.0–36.0)
MCV: 91.7 fL (ref 78.0–100.0)
Monocytes Absolute: 0.6 10*3/uL (ref 0.1–1.0)
Monocytes Relative: 6 % (ref 3–12)
NEUTROS ABS: 7.8 10*3/uL — AB (ref 1.7–7.7)
NEUTROS PCT: 83 % — AB (ref 43–77)
Platelets: 228 10*3/uL (ref 150–400)
RBC: 4.68 MIL/uL (ref 4.22–5.81)
RDW: 13.2 % (ref 11.5–15.5)
WBC: 9.5 10*3/uL (ref 4.0–10.5)

## 2014-10-08 LAB — PROTIME-INR
INR: 1.02 (ref 0.00–1.49)
Prothrombin Time: 13.5 s (ref 11.6–15.2)

## 2014-10-08 LAB — I-STAT CHEM 8, ED
BUN: 10 mg/dL (ref 6–23)
CHLORIDE: 103 meq/L (ref 96–112)
Calcium, Ion: 1.21 mmol/L (ref 1.13–1.30)
Creatinine, Ser: 1 mg/dL (ref 0.50–1.35)
Glucose, Bld: 108 mg/dL — ABNORMAL HIGH (ref 70–99)
HEMATOCRIT: 47 % (ref 39.0–52.0)
Hemoglobin: 16 g/dL (ref 13.0–17.0)
POTASSIUM: 3.8 meq/L (ref 3.7–5.3)
SODIUM: 142 meq/L (ref 137–147)
TCO2: 24 mmol/L (ref 0–100)

## 2014-10-08 LAB — VALPROIC ACID LEVEL

## 2014-10-08 LAB — ABO/RH: ABO/RH(D): A POS

## 2014-10-08 LAB — TYPE AND SCREEN
ABO/RH(D): A POS
ANTIBODY SCREEN: NEGATIVE

## 2014-10-08 MED ORDER — CEFAZOLIN SODIUM-DEXTROSE 2-3 GM-% IV SOLR
2.0000 g | INTRAVENOUS | Status: AC
  Administered 2014-10-09: 2 g via INTRAVENOUS
  Filled 2014-10-08: qty 50

## 2014-10-08 MED ORDER — DIVALPROEX SODIUM ER 250 MG PO TB24
250.0000 mg | ORAL_TABLET | Freq: Every day | ORAL | Status: DC
  Administered 2014-10-08 – 2014-10-09 (×3): 250 mg via ORAL
  Filled 2014-10-08 (×4): qty 1

## 2014-10-08 MED ORDER — DEXTROSE-NACL 5-0.45 % IV SOLN
INTRAVENOUS | Status: AC
  Administered 2014-10-08: 20:00:00 via INTRAVENOUS

## 2014-10-08 MED ORDER — MEMANTINE HCL 10 MG PO TABS
10.0000 mg | ORAL_TABLET | Freq: Every day | ORAL | Status: DC
  Administered 2014-10-10 – 2014-10-11 (×2): 10 mg via ORAL
  Filled 2014-10-08 (×3): qty 1

## 2014-10-08 MED ORDER — HALOPERIDOL 2 MG PO TABS
2.0000 mg | ORAL_TABLET | Freq: Once | ORAL | Status: AC
  Administered 2014-10-08: 2 mg via ORAL
  Filled 2014-10-08: qty 1

## 2014-10-08 MED ORDER — SERTRALINE HCL 50 MG PO TABS
50.0000 mg | ORAL_TABLET | Freq: Every day | ORAL | Status: DC
  Administered 2014-10-10 – 2014-10-11 (×2): 50 mg via ORAL
  Filled 2014-10-08 (×2): qty 1

## 2014-10-08 MED ORDER — DONEPEZIL HCL 10 MG PO TABS
10.0000 mg | ORAL_TABLET | Freq: Every day | ORAL | Status: DC
  Administered 2014-10-08 – 2014-10-10 (×3): 10 mg via ORAL
  Filled 2014-10-08 (×3): qty 1

## 2014-10-08 MED ORDER — HALOPERIDOL LACTATE 5 MG/ML IJ SOLN
2.5000 mg | Freq: Four times a day (QID) | INTRAMUSCULAR | Status: DC | PRN
  Administered 2014-10-08: 2.5 mg via INTRAVENOUS
  Filled 2014-10-08: qty 1

## 2014-10-08 MED ORDER — FENTANYL CITRATE 0.05 MG/ML IJ SOLN
50.0000 ug | INTRAMUSCULAR | Status: DC | PRN
  Administered 2014-10-08: 50 ug via INTRAVENOUS
  Filled 2014-10-08: qty 2

## 2014-10-08 MED ORDER — CHLORHEXIDINE GLUCONATE 4 % EX LIQD
60.0000 mL | Freq: Once | CUTANEOUS | Status: AC
  Administered 2014-10-08: 4 via TOPICAL
  Filled 2014-10-08: qty 60

## 2014-10-08 MED ORDER — DOCUSATE SODIUM 100 MG PO CAPS
100.0000 mg | ORAL_CAPSULE | Freq: Two times a day (BID) | ORAL | Status: DC
  Administered 2014-10-08: 100 mg via ORAL
  Filled 2014-10-08: qty 1

## 2014-10-08 MED ORDER — FENTANYL CITRATE 0.05 MG/ML IJ SOLN
50.0000 ug | Freq: Once | INTRAMUSCULAR | Status: AC
  Administered 2014-10-08: 50 ug via INTRAVENOUS
  Filled 2014-10-08: qty 2

## 2014-10-08 MED ORDER — MORPHINE SULFATE 2 MG/ML IJ SOLN
0.5000 mg | INTRAMUSCULAR | Status: DC | PRN
  Administered 2014-10-08 – 2014-10-10 (×2): 0.5 mg via INTRAVENOUS
  Filled 2014-10-08 (×2): qty 1

## 2014-10-08 MED ORDER — ONDANSETRON HCL 4 MG/2ML IJ SOLN: 4.0000 mg | Freq: Once | INTRAMUSCULAR | Status: DC

## 2014-10-08 MED ORDER — HYDROCODONE-ACETAMINOPHEN 5-325 MG PO TABS
1.0000 | ORAL_TABLET | Freq: Four times a day (QID) | ORAL | Status: DC | PRN
  Administered 2014-10-10 (×2): 1 via ORAL
  Filled 2014-10-08 (×2): qty 1

## 2014-10-08 NOTE — ED Notes (Signed)
PT INSTRUCTED AND EXPLAINED NPO STATUS

## 2014-10-08 NOTE — ED Notes (Signed)
unwitnessed fall onto ground from a ladder pta at care facility pt with advanced dementia pt does not recall

## 2014-10-08 NOTE — ED Notes (Signed)
Obtained consent from pts wife for right hemiarthroplasty after ortho md at bedside talking with pt and spouse

## 2014-10-08 NOTE — ED Notes (Signed)
PER [facility] PT LAST ATE AND DRANK AT 0930 TODAY

## 2014-10-08 NOTE — ED Provider Notes (Signed)
CSN: [id number]     Arrival date & time 10/08/14  1300 History   First MD Initiated Contact with Patient 10/08/14 1319     Chief Complaint  Patient presents with  . Fall     (Consider location/radiation/quality/duration/timing/severity/associated sxs/prior Treatment) HPI Comments: Patient presents after sustaining a fall. He is brought in by EMS. His wife states that she has a history of dementia. He is normally confused. He has baseline mental status now. He was climbing up a ladder to blow leaves off of the roof. She reports that he fell off the ladder. She didn't actually witness the fall but she was right near the patient and was there right after he fell. He had no loss of consciousness. He's been complaining of pain to his right hip and leg. She states she still acting his baseline mental status. He's not on anticoagulants. He fell about 5-7 feet onto a dirt surface.  Patient is a 73 y.o. male presenting with fall.  Fall    Past Medical History  Diagnosis Date  . BPH (benign prostatic hypertrophy)   . Depression   . Essential hypertension, benign   . Reflux   . Dementia    Past Surgical History  Procedure Laterality Date  . Transurethral resection of prostate     History reviewed. No pertinent family history. History  Substance Use Topics  . Smoking status: Current Every Day Smoker -- 0.30 packs/day    Types: Cigarettes  . Smokeless tobacco: Not on file  . Alcohol Use: No    Review of Systems  Unable to perform ROS: Dementia      Allergies  Niacin and related and Terazosin  Home Medications   Prior to Admission medications   Medication Sig Start Date End Date Taking? Authorizing Provider  divalproex (DEPAKOTE ER) 250 MG 24 hr tablet Take 250 mg by mouth daily after lunch.   Yes Historical Provider, MD  donepezil (ARICEPT) 10 MG tablet Take 10 mg by mouth at bedtime.    Yes Historical Provider, MD  memantine (NAMENDA) 10 MG tablet Take 10 mg by mouth daily.    Yes Historical Provider, MD  sertraline (ZOLOFT) 50 MG tablet Take 50 mg by mouth daily.   Yes Historical Provider, MD  vitamin B-12 (CYANOCOBALAMIN) 100 MCG tablet Take 100 mcg by mouth daily as needed (for vitamin).   Yes Historical Provider, MD   BP 145/66  Pulse 72  Temp(Src) 97.7 F (36.5 C) (Oral)  Resp 18  SpO2 97% Physical Exam  Constitutional: He appears well-developed and well-nourished.  HENT:  Head: Normocephalic and atraumatic.  Eyes: Pupils are equal, round, and reactive to light.  Neck: Normal range of motion. Neck supple.  No tenderness to the cervical thoracic or lumbosacral spine.   Cardiovascular: Normal rate, regular rhythm and normal heart sounds.   Pulmonary/Chest: Effort normal and breath sounds normal. No respiratory distress. He has no wheezes. He has no rales. He exhibits no tenderness.  Abdominal: Soft. Bowel sounds are normal. There is no tenderness. There is no rebound and no guarding.  Musculoskeletal: Normal range of motion. He exhibits no edema.  Positive tenderness on palpation and range of motion of the right hip in the right mid thigh. There is no swelling or deformity noted. There is no pain to the knee or ankle. Distal pulses are intact. He has no other pain on palpation or range of motion of the extremities.  Lymphadenopathy:    He has no cervical adenopathy.  Neurological: He is alert.  Patient is confused but answers questions and is alert. He moves all actually symmetrically other than the right leg which she is protecting due to discomfort. He has baseline mental status per the wife.  Skin: Skin is warm and dry. No rash noted.  Psychiatric: He has a normal mood and affect.    ED Course  Procedures (including critical care time) Labs Review Labs Reviewed  CBC WITH DIFFERENTIAL - Abnormal; Notable for the following:    Neutrophils Relative % 83 (*)    Neutro Abs 7.8 (*)    Lymphocytes Relative 11 (*)    All other components within normal  limits  I-STAT CHEM 8, ED - Abnormal; Notable for the following:    Glucose, Bld 108 (*)    All other components within normal limits  PROTIME-INR  TYPE AND SCREEN    Imaging Review Dg Chest 1 View  10/08/2014   CLINICAL DATA:  [place] off a ladder.  Mental status change.  EXAM: CHEST - 1 VIEW  COMPARISON:  05/18/2010  FINDINGS: The cardiac silhouette, mediastinal and hilar contours are within normal limits. There is tortuosity and calcification of the thoracic aorta. The lungs are clear. No pleural effusion. The bony thorax is intact.  IMPRESSION: No acute cardiopulmonary findings and intact bony thorax.   Electronically Signed   By: Kalman Jewels M.D.   On: 10/08/2014 14:41   Dg Hip Complete Right  10/08/2014   CLINICAL DATA:  [place] off ladder today.  Right hip pain  EXAM: RIGHT HIP - COMPLETE 2+ VIEW  COMPARISON:  None.  FINDINGS: Right femoral neck fracture. No significant displacement. Right hip joint is normal.  IMPRESSION: Nondisplaced fracture right femoral neck.   Electronically Signed   By: Franchot Gallo M.D.   On: 10/08/2014 14:35   Dg Femur Right  10/08/2014   CLINICAL DATA:  [place] off ladder today  EXAM: RIGHT FEMUR - 2 VIEW  COMPARISON:  Right hip from today  FINDINGS: Right femoral neck fracture. No other fracture of the femur. The knee joint appears normal.  IMPRESSION: Femoral neck fracture.   Electronically Signed   By: Franchot Gallo M.D.   On: 10/08/2014 14:36   Ct Head Wo Contrast  10/08/2014   CLINICAL DATA:  Unwitnessed fall.  Patient found on ground.  EXAM: CT HEAD WITHOUT CONTRAST  CT CERVICAL SPINE WITHOUT CONTRAST  TECHNIQUE: Multidetector CT imaging of the head and cervical spine was performed following the standard protocol without intravenous contrast. Multiplanar CT image reconstructions of the cervical spine were also generated.  COMPARISON:  05/18/2010 -CT C spine  FINDINGS: CT HEAD FINDINGS  There is no evidence of mass effect, midline shift, or extra-axial  fluid collections. There is no evidence of a space-occupying lesion or intracranial hemorrhage. There is no evidence of a cortical-based area of acute infarction. There is generalized cerebral atrophy. There is periventricular white matter low attenuation likely secondary to microangiopathy.  The ventricles and sulci are appropriate for the patient's age. The basal cisterns are patent.  Visualized portions of the orbits are unremarkable. There is mild right sphenoid sinus mucosal thickening. Cerebrovascular atherosclerotic calcifications are noted.  The osseous structures are unremarkable.  CT CERVICAL SPINE FINDINGS  The alignment is anatomic. The vertebral body heights are maintained. Incidental note is made of incomplete fusion of the posterior arch of C1. There is no acute fracture. There is no static listhesis. The prevertebral soft tissues are normal. The intraspinal soft tissues  are not fully imaged on this examination due to poor soft tissue contrast, but there is no gross soft tissue abnormality.  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 There is degenerative disc disease at C3-4, C4-5 C6-7. Bilateral uncovertebral degenerative changes at C3-4. Mild broad-based disc bulge at C4-5. Mild broad-based disc osteophyte complex at C5-6 with bilateral uncovertebral degenerative changes resulting in mild left foraminal encroachment. Mild broad-based disc bulge at C6-7 with bilateral uncovertebral degenerative changes.  The visualized portions of the lung apices demonstrate no focal abnormality.  IMPRESSION: 1. No acute intracranial pathology. 2. No acute osseous injury of the cervical spine.   Electronically Signed   By: Kathreen Devoid   On: 10/08/2014 14:49   Ct Cervical Spine Wo Contrast  10/08/2014   CLINICAL DATA:  Unwitnessed fall.  Patient found on ground.  EXAM: CT HEAD WITHOUT CONTRAST  CT CERVICAL SPINE WITHOUT CONTRAST  TECHNIQUE: Multidetector CT imaging of the head and cervical spine was  performed following the standard protocol without intravenous contrast. Multiplanar CT image reconstructions of the cervical spine were also generated.  COMPARISON:  05/18/2010 -CT C spine  FINDINGS: CT HEAD FINDINGS  There is no evidence of mass effect, midline shift, or extra-axial fluid collections. There is no evidence of a space-occupying lesion or intracranial hemorrhage. There is no evidence of a cortical-based area of acute infarction. There is generalized cerebral atrophy. There is periventricular white matter low attenuation likely secondary to microangiopathy.  The ventricles and sulci are appropriate for the patient's age. The basal cisterns are patent.  Visualized portions of the orbits are unremarkable. There is mild right sphenoid sinus mucosal thickening. Cerebrovascular atherosclerotic calcifications are noted.  The osseous structures are unremarkable.  CT CERVICAL SPINE FINDINGS  The alignment is anatomic. The vertebral body heights are maintained. Incidental note is made of incomplete fusion of the posterior arch of C1. There is no acute fracture. There is no static listhesis. The prevertebral soft tissues are normal. The intraspinal soft tissues are not fully imaged on this examination due to poor soft tissue contrast, but there is no gross soft tissue abnormality.  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 There is degenerative disc disease at C3-4, C4-5 C6-7. Bilateral uncovertebral degenerative changes at C3-4. Mild broad-based disc bulge at C4-5. Mild broad-based disc osteophyte complex at C5-6 with bilateral uncovertebral degenerative changes resulting in mild left foraminal encroachment. Mild broad-based disc bulge at C6-7 with bilateral uncovertebral degenerative changes.  The visualized portions of the lung apices demonstrate no focal abnormality.  IMPRESSION: 1. No acute intracranial pathology. 2. No acute osseous injury of the cervical spine.   Electronically Signed   By:  Kathreen Devoid   On: 10/08/2014 14:49     EKG Interpretation None      MDM   Final diagnoses:  Head injury  Fall  Hip pain  Hip fracture requiring operative repair, right, closed, initial encounter    Spoke with Dr. Ronnie Derby who will repair hip, will admit to [facility].    Malvin Johns, MD 10/08/14 [id number]

## 2014-10-08 NOTE — ED Notes (Signed)
Pt incont urine staff cleaned pt and changed linens

## 2014-10-08 NOTE — ED Notes (Signed)
Myself and Lanice Schwab., RN changed pt for pt had spilled the urinal on himself while using it with help from his family; stretcher linens and a clean dry gown were placed on pt; warm blanket given; family at bedside

## 2014-10-08 NOTE — ED Notes (Signed)
pts spouse informed pt will go to or tomorrow per call received from Dr. Ronnie Derby

## 2014-10-08 NOTE — ED Notes (Signed)
MD at bedside. 

## 2014-10-08 NOTE — ED Notes (Signed)
Distal cms intact to right leg

## 2014-10-08 NOTE — ED Notes (Signed)
Spoke with Dr. [place] Pink about pt.  PO medication to be ordered.

## 2014-10-08 NOTE — H&P (Addendum)
[facility] History and Physical  Christopher Summers ENI:[id number] DOB: 1941-07-03 DOA: 10/08/2014   PCP: Followed by the [facility] system in [place] Specialists: None  Chief Complaint: Fall  HPI: Christopher Summers is a 73 y.o. male with a past medical history of Alzheimer's dementia who was in his usual state of health until earlier today when he was climbing a ladder to blow leaves off of his roof. Subsequently he fell off the ladder after losing balance. There was no loss of consciousness. Patient was complaining of pain in his right hip and leg. Patient has advanced Alzheimers dementia and doesn't recall the event. He denies any pain at this time. History is not available from the patient. His wife provided most of the information. Patient's wife denies any recent illness or sickness. No recent fever or chills. No nausea, vomiting. No recent changes to his medications.  Home Medications: Prior to Admission medications   Medication Sig Start Date End Date Taking? Authorizing Provider  divalproex (DEPAKOTE ER) 250 MG 24 hr tablet Take 250 mg by mouth daily after lunch.   Yes Historical Provider, MD  donepezil (ARICEPT) 10 MG tablet Take 10 mg by mouth at bedtime.    Yes Historical Provider, MD  memantine (NAMENDA) 10 MG tablet Take 10 mg by mouth daily.   Yes Historical Provider, MD  sertraline (ZOLOFT) 50 MG tablet Take 50 mg by mouth daily.   Yes Historical Provider, MD  vitamin B-12 (CYANOCOBALAMIN) 100 MCG tablet Take 100 mcg by mouth daily as needed (for vitamin).   Yes Historical Provider, MD    Allergies:  Allergies  Allergen Reactions  . Niacin And Related Other (See Comments)    Turned red all over  . Terazosin     Past Medical History: Past Medical History  Diagnosis Date  . BPH (benign prostatic hypertrophy)   . Depression   . Essential hypertension, benign   . Reflux   . Dementia     Past Surgical History  Procedure Laterality Date  . Transurethral  resection of prostate      Social History: Lives with his wife. Smokes half a pack of cigarettes on a daily basis. No alcohol use. No history of recreational drug use. He is quite active at home. But the has difficulties performing IADLs. No living will or advanced directives.  Family History:  Family History  Problem Relation Age of Onset  . Heart disease Brother      Review of Systems - unable to obtain due to his dementia.  Physical Examination  Filed Vitals:   10/08/14 1345 10/08/14 1359 10/08/14 1545 10/08/14 1600  BP: 146/83 146/83 151/101 145/66  Pulse: 57 57 68 72  Temp:      TempSrc:      Resp:      SpO2: 98% 100% 97% 97%    BP 145/66  Pulse 72  Temp(Src) 97.7 F (36.5 C) (Oral)  Resp 18  SpO2 97%  General appearance: alert, distracted and no distress Head: Normocephalic, without obvious abnormality, atraumatic Eyes: conjunctivae/corneas clear. PERRL, EOM's intact.  Throat: lips, mucosa, and tongue normal; teeth and gums normal Neck: no adenopathy, no carotid bruit, no JVD, supple, symmetrical, trachea midline and thyroid not enlarged, symmetric, no tenderness/mass/nodules Resp: clear to auscultation bilaterally Cardio: regular rate and rhythm, S1, S2 normal, no murmur, click, rub or gallop GI: soft, non-tender; bowel sounds normal; no masses,  no organomegaly Extremities: Right lower extremity is externally rotated. Pulses: 2+ and symmetric Skin: Skin  color, texture, turgor normal. No rashes or lesions Lymph nodes: Cervical, supraclavicular, and axillary nodes normal. Neurologic: Alert. Confused. No facial asymmetry. Tongue is midline. Strength is 5 out of 5 bilateral upper extremities. No weakness in the left leg. Right leg has limited range of motion due to hip fracture. Gait not assessed.  Laboratory Data: Results for orders placed during the hospital encounter of 10/08/14 (from the past 48 hour(s))  CBC WITH DIFFERENTIAL     Status: Abnormal   Collection  Time    10/08/14  2:55 PM      Result Value Ref Range   WBC 9.5  4.0 - 10.5 K/uL   RBC 4.68  4.22 - 5.81 MIL/uL   Hemoglobin 13.9  13.0 - 17.0 g/dL   HCT 42.9  39.0 - 52.0 %   MCV 91.7  78.0 - 100.0 fL   MCH 29.7  26.0 - 34.0 pg   MCHC 32.4  30.0 - 36.0 g/dL   RDW 13.2  11.5 - 15.5 %   Platelets 228  150 - 400 K/uL   Neutrophils Relative % 83 (*) 43 - 77 %   Neutro Abs 7.8 (*) 1.7 - 7.7 K/uL   Lymphocytes Relative 11 (*) 12 - 46 %   Lymphs Abs 1.0  0.7 - 4.0 K/uL   Monocytes Relative 6  3 - 12 %   Monocytes Absolute 0.6  0.1 - 1.0 K/uL   Eosinophils Relative 0  0 - 5 %   Eosinophils Absolute 0.0  0.0 - 0.7 K/uL   Basophils Relative 0  0 - 1 %   Basophils Absolute 0.0  0.0 - 0.1 K/uL  I-STAT CHEM 8, ED     Status: Abnormal   Collection Time    10/08/14  3:04 PM      Result Value Ref Range   Sodium 142  137 - 147 mEq/L   Potassium 3.8  3.7 - 5.3 mEq/L   Chloride 103  96 - 112 mEq/L   BUN 10  6 - 23 mg/dL   Creatinine, Ser 1.00  0.50 - 1.35 mg/dL   Glucose, Bld 108 (*) 70 - 99 mg/dL   Calcium, Ion 1.21  1.13 - 1.30 mmol/L   TCO2 24  0 - 100 mmol/L   Hemoglobin 16.0  13.0 - 17.0 g/dL   HCT 47.0  39.0 - 52.0 %    Radiology Reports: Dg Chest 1 View  10/08/2014   CLINICAL DATA:  [place] off a ladder.  Mental status change.  EXAM: CHEST - 1 VIEW  COMPARISON:  05/18/2010  FINDINGS: The cardiac silhouette, mediastinal and hilar contours are within normal limits. There is tortuosity and calcification of the thoracic aorta. The lungs are clear. No pleural effusion. The bony thorax is intact.  IMPRESSION: No acute cardiopulmonary findings and intact bony thorax.   Electronically Signed   By: Kalman Jewels M.D.   On: 10/08/2014 14:41   Dg Hip Complete Right  10/08/2014   CLINICAL DATA:  [place] off ladder today.  Right hip pain  EXAM: RIGHT HIP - COMPLETE 2+ VIEW  COMPARISON:  None.  FINDINGS: Right femoral neck fracture. No significant displacement. Right hip joint is normal.  IMPRESSION:  Nondisplaced fracture right femoral neck.   Electronically Signed   By: Franchot Gallo M.D.   On: 10/08/2014 14:35   Dg Femur Right  10/08/2014   CLINICAL DATA:  [place] off ladder today  EXAM: RIGHT FEMUR - 2 VIEW  COMPARISON:  Right hip from today  FINDINGS: Right femoral neck fracture. No other fracture of the femur. The knee joint appears normal.  IMPRESSION: Femoral neck fracture.   Electronically Signed   By: Franchot Gallo M.D.   On: 10/08/2014 14:36   Ct Head Wo Contrast  10/08/2014   CLINICAL DATA:  Unwitnessed fall.  Patient found on ground.  EXAM: CT HEAD WITHOUT CONTRAST  CT CERVICAL SPINE WITHOUT CONTRAST  TECHNIQUE: Multidetector CT imaging of the head and cervical spine was performed following the standard protocol without intravenous contrast. Multiplanar CT image reconstructions of the cervical spine were also generated.  COMPARISON:  05/18/2010 -CT C spine  FINDINGS: CT HEAD FINDINGS  There is no evidence of mass effect, midline shift, or extra-axial fluid collections. There is no evidence of a space-occupying lesion or intracranial hemorrhage. There is no evidence of a cortical-based area of acute infarction. There is generalized cerebral atrophy. There is periventricular white matter low attenuation likely secondary to microangiopathy.  The ventricles and sulci are appropriate for the patient's age. The basal cisterns are patent.  Visualized portions of the orbits are unremarkable. There is mild right sphenoid sinus mucosal thickening. Cerebrovascular atherosclerotic calcifications are noted.  The osseous structures are unremarkable.  CT CERVICAL SPINE FINDINGS  The alignment is anatomic. The vertebral body heights are maintained. Incidental note is made of incomplete fusion of the posterior arch of C1. There is no acute fracture. There is no static listhesis. The prevertebral soft tissues are normal. The intraspinal soft tissues are not fully imaged on this examination due to poor soft  tissue contrast, but there is no gross soft tissue abnormality.  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 There is degenerative disc disease at C3-4, C4-5 C6-7. Bilateral uncovertebral degenerative changes at C3-4. Mild broad-based disc bulge at C4-5. Mild broad-based disc osteophyte complex at C5-6 with bilateral uncovertebral degenerative changes resulting in mild left foraminal encroachment. Mild broad-based disc bulge at C6-7 with bilateral uncovertebral degenerative changes.  The visualized portions of the lung apices demonstrate no focal abnormality.  IMPRESSION: 1. No acute intracranial pathology. 2. No acute osseous injury of the cervical spine.   Electronically Signed   By: Kathreen Devoid   On: 10/08/2014 14:49   Ct Cervical Spine Wo Contrast  10/08/2014   CLINICAL DATA:  Unwitnessed fall.  Patient found on ground.  EXAM: CT HEAD WITHOUT CONTRAST  CT CERVICAL SPINE WITHOUT CONTRAST  TECHNIQUE: Multidetector CT imaging of the head and cervical spine was performed following the standard protocol without intravenous contrast. Multiplanar CT image reconstructions of the cervical spine were also generated.  COMPARISON:  05/18/2010 -CT C spine  FINDINGS: CT HEAD FINDINGS  There is no evidence of mass effect, midline shift, or extra-axial fluid collections. There is no evidence of a space-occupying lesion or intracranial hemorrhage. There is no evidence of a cortical-based area of acute infarction. There is generalized cerebral atrophy. There is periventricular white matter low attenuation likely secondary to microangiopathy.  The ventricles and sulci are appropriate for the patient's age. The basal cisterns are patent.  Visualized portions of the orbits are unremarkable. There is mild right sphenoid sinus mucosal thickening. Cerebrovascular atherosclerotic calcifications are noted.  The osseous structures are unremarkable.  CT CERVICAL SPINE FINDINGS  The alignment is anatomic. The vertebral body  heights are maintained. Incidental note is made of incomplete fusion of the posterior arch of C1. There is no acute fracture. There is no static listhesis. The prevertebral soft tissues  are normal. The intraspinal soft tissues are not fully imaged on this examination due to poor soft tissue contrast, but there is no gross soft tissue abnormality.  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 There is degenerative disc disease at C3-4, C4-5 C6-7. Bilateral uncovertebral degenerative changes at C3-4. Mild broad-based disc bulge at C4-5. Mild broad-based disc osteophyte complex at C5-6 with bilateral uncovertebral degenerative changes resulting in mild left foraminal encroachment. Mild broad-based disc bulge at C6-7 with bilateral uncovertebral degenerative changes.  The visualized portions of the lung apices demonstrate no focal abnormality.  IMPRESSION: 1. No acute intracranial pathology. 2. No acute osseous injury of the cervical spine.   Electronically Signed   By: Kathreen Devoid   On: 10/08/2014 14:49    Electrocardiogram: Sinus rhythm at 61 bpm. Appears to be normal axis. No Q waves. Intervals are normal. No concerning ST changes. Nonspecific T-wave changes.  Problem List  Principal Problem:   Hip fracture, right Active Problems:   Alzheimer's dementia   Assessment: This is a 73 year old African-American male with a past medical history of Alzheimer's dementia who had a mechanical fall resulting in right hip fracture. He otherwise does not have any significant health problems.   Plan: #1 Right hip fracture/preoperative evaluation: Patient has good functional status. EKG does not show any concerning changes. He may proceed to surgery without further cardiac testing. Pain control for hip fracture. Orthopedics has been consulted and they will take him to surgery either later today or tomorrow.  #2 History of Alzheimer's dementia: Mental status is at baseline per family. Continue with his home  medications. Check valproic acid level. Patient may experience agitation and transient worsening of his confusion after anesthesia. I have explained this to the family. Having a familiar face near him may be helpful, so family will try to be around him. Avoid benzodiazepines. Haldol as needed.   DVT Prophylaxis: SCDs for now. Definitive prophylaxis after surgery and per orthopedics Code Status: Discussed with the patient's family. He is a full code Family Communication: Discussed with the patient's wife and son  Disposition Plan: Admit to MedSurg   Further management decisions will depend on results of further testing and patient's response to treatment.   [facility]  [facility] Pager [phone]  If 7PM-7AM, please contact night-coverage [email or web address] Password [facility]  10/08/2014, 4:28 PM

## 2014-10-08 NOTE — Consult Note (Signed)
Reason for Consult:Right Hip Pain Referring Physician: ED MD  Christopher Summers is an 73 y.o. male.  HPI: Pt fell of ladder  Past Medical History  Diagnosis Date  . BPH (benign prostatic hypertrophy)   . Depression   . Essential hypertension, benign   . Reflux   . Dementia     Past Surgical History  Procedure Laterality Date  . Transurethral resection of prostate      Family History  Problem Relation Age of Onset  . Heart disease Brother     Social History:  reports that he has been smoking Cigarettes.  He has been smoking about 0.50 packs per day. He does not have any smokeless tobacco history on file. He reports that he does not drink alcohol or use illicit drugs.  Allergies:  Allergies  Allergen Reactions  . Niacin And Related Other (See Comments)    Turned red all over  . Terazosin     Medications: I have reviewed the patient's current medications.  Results for orders placed during the hospital encounter of 10/08/14 (from the past 48 hour(s))  CBC WITH DIFFERENTIAL     Status: Abnormal   Collection Time    10/08/14  2:55 PM      Result Value Ref Range   WBC 9.5  4.0 - 10.5 K/uL   RBC 4.68  4.22 - 5.81 MIL/uL   Hemoglobin 13.9  13.0 - 17.0 g/dL   HCT 42.9  39.0 - 52.0 %   MCV 91.7  78.0 - 100.0 fL   MCH 29.7  26.0 - 34.0 pg   MCHC 32.4  30.0 - 36.0 g/dL   RDW 13.2  11.5 - 15.5 %   Platelets 228  150 - 400 K/uL   Neutrophils Relative % 83 (*) 43 - 77 %   Neutro Abs 7.8 (*) 1.7 - 7.7 K/uL   Lymphocytes Relative 11 (*) 12 - 46 %   Lymphs Abs 1.0  0.7 - 4.0 K/uL   Monocytes Relative 6  3 - 12 %   Monocytes Absolute 0.6  0.1 - 1.0 K/uL   Eosinophils Relative 0  0 - 5 %   Eosinophils Absolute 0.0  0.0 - 0.7 K/uL   Basophils Relative 0  0 - 1 %   Basophils Absolute 0.0  0.0 - 0.1 K/uL  I-STAT CHEM 8, ED     Status: Abnormal   Collection Time    10/08/14  3:04 PM      Result Value Ref Range   Sodium 142  137 - 147 mEq/L   Potassium 3.8  3.7 - 5.3 mEq/L    Chloride 103  96 - 112 mEq/L   BUN 10  6 - 23 mg/dL   Creatinine, Ser 1.00  0.50 - 1.35 mg/dL   Glucose, Bld 108 (*) 70 - 99 mg/dL   Calcium, Ion 1.21  1.13 - 1.30 mmol/L   TCO2 24  0 - 100 mmol/L   Hemoglobin 16.0  13.0 - 17.0 g/dL   HCT 47.0  39.0 - 52.0 %  TYPE AND SCREEN     Status: None   Collection Time    10/08/14  4:37 PM      Result Value Ref Range   ABO/RH(D) A POS     Antibody Screen NEG     Sample Expiration 10/11/2014    PROTIME-INR     Status: None   Collection Time    10/08/14  4:38 PM  Result Value Ref Range   Prothrombin Time 13.5  11.6 - 15.2 seconds   INR 1.02  0.00 - 1.49  VALPROIC ACID LEVEL     Status: Abnormal   Collection Time    10/08/14  4:38 PM      Result Value Ref Range   Valproic Acid Lvl <10.0 (*) 50.0 - 100.0 ug/mL    Dg Chest 1 View  10/08/2014   CLINICAL DATA:  [place] off a ladder.  Mental status change.  EXAM: CHEST - 1 VIEW  COMPARISON:  05/18/2010  FINDINGS: The cardiac silhouette, mediastinal and hilar contours are within normal limits. There is tortuosity and calcification of the thoracic aorta. The lungs are clear. No pleural effusion. The bony thorax is intact.  IMPRESSION: No acute cardiopulmonary findings and intact bony thorax.   Electronically Signed   By: Kalman Jewels M.D.   On: 10/08/2014 14:41   Dg Hip Complete Right  10/08/2014   CLINICAL DATA:  [place] off ladder today.  Right hip pain  EXAM: RIGHT HIP - COMPLETE 2+ VIEW  COMPARISON:  None.  FINDINGS: Right femoral neck fracture. No significant displacement. Right hip joint is normal.  IMPRESSION: Nondisplaced fracture right femoral neck.   Electronically Signed   By: Franchot Gallo M.D.   On: 10/08/2014 14:35   Dg Femur Right  10/08/2014   CLINICAL DATA:  [place] off ladder today  EXAM: RIGHT FEMUR - 2 VIEW  COMPARISON:  Right hip from today  FINDINGS: Right femoral neck fracture. No other fracture of the femur. The knee joint appears normal.  IMPRESSION: Femoral neck fracture.    Electronically Signed   By: Franchot Gallo M.D.   On: 10/08/2014 14:36   Ct Head Wo Contrast  10/08/2014   CLINICAL DATA:  Unwitnessed fall.  Patient found on ground.  EXAM: CT HEAD WITHOUT CONTRAST  CT CERVICAL SPINE WITHOUT CONTRAST  TECHNIQUE: Multidetector CT imaging of the head and cervical spine was performed following the standard protocol without intravenous contrast. Multiplanar CT image reconstructions of the cervical spine were also generated.  COMPARISON:  05/18/2010 -CT C spine  FINDINGS: CT HEAD FINDINGS  There is no evidence of mass effect, midline shift, or extra-axial fluid collections. There is no evidence of a space-occupying lesion or intracranial hemorrhage. There is no evidence of a cortical-based area of acute infarction. There is generalized cerebral atrophy. There is periventricular white matter low attenuation likely secondary to microangiopathy.  The ventricles and sulci are appropriate for the patient's age. The basal cisterns are patent.  Visualized portions of the orbits are unremarkable. There is mild right sphenoid sinus mucosal thickening. Cerebrovascular atherosclerotic calcifications are noted.  The osseous structures are unremarkable.  CT CERVICAL SPINE FINDINGS  The alignment is anatomic. The vertebral body heights are maintained. Incidental note is made of incomplete fusion of the posterior arch of C1. There is no acute fracture. There is no static listhesis. The prevertebral soft tissues are normal. The intraspinal soft tissues are not fully imaged on this examination due to poor soft tissue contrast, but there is no gross soft tissue abnormality.  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 There is degenerative disc disease at C3-4, C4-5 C6-7. Bilateral uncovertebral degenerative changes at C3-4. Mild broad-based disc bulge at C4-5. Mild broad-based disc osteophyte complex at C5-6 with bilateral uncovertebral degenerative changes resulting in mild left foraminal  encroachment. Mild broad-based disc bulge at C6-7 with bilateral uncovertebral degenerative changes.  The visualized portions of the  lung apices demonstrate no focal abnormality.  IMPRESSION: 1. No acute intracranial pathology. 2. No acute osseous injury of the cervical spine.   Electronically Signed   By: Kathreen Devoid   On: 10/08/2014 14:49   Ct Cervical Spine Wo Contrast  10/08/2014   CLINICAL DATA:  Unwitnessed fall.  Patient found on ground.  EXAM: CT HEAD WITHOUT CONTRAST  CT CERVICAL SPINE WITHOUT CONTRAST  TECHNIQUE: Multidetector CT imaging of the head and cervical spine was performed following the standard protocol without intravenous contrast. Multiplanar CT image reconstructions of the cervical spine were also generated.  COMPARISON:  05/18/2010 -CT C spine  FINDINGS: CT HEAD FINDINGS  There is no evidence of mass effect, midline shift, or extra-axial fluid collections. There is no evidence of a space-occupying lesion or intracranial hemorrhage. There is no evidence of a cortical-based area of acute infarction. There is generalized cerebral atrophy. There is periventricular white matter low attenuation likely secondary to microangiopathy.  The ventricles and sulci are appropriate for the patient's age. The basal cisterns are patent.  Visualized portions of the orbits are unremarkable. There is mild right sphenoid sinus mucosal thickening. Cerebrovascular atherosclerotic calcifications are noted.  The osseous structures are unremarkable.  CT CERVICAL SPINE FINDINGS  The alignment is anatomic. The vertebral body heights are maintained. Incidental note is made of incomplete fusion of the posterior arch of C1. There is no acute fracture. There is no static listhesis. The prevertebral soft tissues are normal. The intraspinal soft tissues are not fully imaged on this examination due to poor soft tissue contrast, but there is no gross soft tissue abnormality.  There is severe degenerative disc disease at  C5-C6. There is degenerative disc disease at C3-4, C4-5 C6-7. Bilateral uncovertebral degenerative changes at C3-4. Mild broad-based disc bulge at C4-5. Mild broad-based disc osteophyte complex at C5-6 with bilateral uncovertebral degenerative changes resulting in mild left foraminal encroachment. Mild broad-based disc bulge at C6-7 with bilateral uncovertebral degenerative changes.  The visualized portions of the lung apices demonstrate no focal abnormality.  IMPRESSION: 1. No acute intracranial pathology. 2. No acute osseous injury of the cervical spine.   Electronically Signed   By: Kathreen Devoid   On: 10/08/2014 14:49    Review of Systems  Constitutional: Negative.   HENT: Negative.   Eyes: Negative.   Respiratory: Negative.   Cardiovascular: Negative.   Gastrointestinal: Negative.   Genitourinary: Negative.   Musculoskeletal: Positive for joint pain.  Skin: Negative.   Neurological: Negative.   Endo/Heme/Allergies: Negative.   Psychiatric/Behavioral: Positive for memory loss.   Blood pressure 124/82, pulse 61, temperature 97.7 F (36.5 C), temperature source Oral, resp. rate 22, SpO2 100.00%. Physical Exam  Constitutional: He appears well-developed.  Neck: Normal range of motion.  Cardiovascular: Normal rate.   Respiratory: Effort normal.  GI: Soft.  Musculoskeletal: Normal range of motion. He exhibits tenderness.  Neurological: He is alert.    Assessment/Plan: Right Hip Femoral Neck Fracture Admit to Medicine To OR tomorrow for Hemiarthroplasty by Dr. Helane Gunther  Denine Brotz,STEPHEN D 10/08/2014, 5:53 PM

## 2014-10-08 NOTE — ED Notes (Signed)
Pt becoming anxious in room.  Admitting MD to be paged for anxiety medication.

## 2014-10-09 ENCOUNTER — Encounter (HOSPITAL_COMMUNITY): Payer: Medicare Other | Admitting: Anesthesiology

## 2014-10-09 ENCOUNTER — Inpatient Hospital Stay (HOSPITAL_COMMUNITY): Payer: Medicare Other

## 2014-10-09 ENCOUNTER — Encounter (HOSPITAL_COMMUNITY)
Admission: EM | Disposition: A | Discharge status: Skilled Nursing Facility | Payer: Self-pay | Source: Home / Self Care | Attending: Internal Medicine

## 2014-10-09 ENCOUNTER — Inpatient Hospital Stay (HOSPITAL_COMMUNITY): Payer: Medicare Other | Admitting: Anesthesiology

## 2014-10-09 DIAGNOSIS — W19XXXD Unspecified fall, subsequent encounter: Secondary | ICD-10-CM

## 2014-10-09 DIAGNOSIS — I10 Essential (primary) hypertension: Secondary | ICD-10-CM

## 2014-10-09 HISTORY — PX: HIP ARTHROPLASTY: SHX981

## 2014-10-09 LAB — CBC
HCT: 36.3 % — ABNORMAL LOW (ref 39.0–52.0)
Hemoglobin: 11.8 g/dL — ABNORMAL LOW (ref 13.0–17.0)
MCH: 29.4 pg (ref 26.0–34.0)
MCHC: 32.5 g/dL (ref 30.0–36.0)
MCV: 90.5 fL (ref 78.0–100.0)
Platelets: 228 10*3/uL (ref 150–400)
RBC: 4.01 MIL/uL — ABNORMAL LOW (ref 4.22–5.81)
RDW: 13 % (ref 11.5–15.5)
WBC: 9.8 10*3/uL (ref 4.0–10.5)

## 2014-10-09 LAB — BASIC METABOLIC PANEL WITH GFR
Anion gap: 15 (ref 5–15)
BUN: 9 mg/dL (ref 6–23)
CO2: 21 meq/L (ref 19–32)
Calcium: 9.1 mg/dL (ref 8.4–10.5)
Chloride: 103 meq/L (ref 96–112)
Creatinine, Ser: 0.93 mg/dL (ref 0.50–1.35)
GFR calc Af Amer: >90 mL/min
GFR calc non Af Amer: 83 mL/min — ABNORMAL LOW
Glucose, Bld: 120 mg/dL — ABNORMAL HIGH (ref 70–99)
Potassium: 3.7 meq/L (ref 3.7–5.3)
Sodium: 139 meq/L (ref 137–147)

## 2014-10-09 LAB — SURGICAL PCR SCREEN
MRSA, PCR: NEGATIVE
STAPHYLOCOCCUS AUREUS: NEGATIVE

## 2014-10-09 SURGERY — HEMIARTHROPLASTY, HIP, DIRECT ANTERIOR APPROACH, FOR FRACTURE
Anesthesia: General | Laterality: Right

## 2014-10-09 MED ORDER — ACETAMINOPHEN 650 MG RE SUPP: 650.0000 mg | Freq: Four times a day (QID) | RECTAL | Status: DC | PRN

## 2014-10-09 MED ORDER — ONDANSETRON HCL 4 MG/2ML IJ SOLN
INTRAMUSCULAR | Status: DC | PRN
  Administered 2014-10-09: 4 mg via INTRAVENOUS

## 2014-10-09 MED ORDER — MIDAZOLAM HCL 2 MG/2ML IJ SOLN
INTRAMUSCULAR | Status: AC
  Filled 2014-10-09: qty 2

## 2014-10-09 MED ORDER — PROPOFOL 10 MG/ML IV BOLUS
INTRAVENOUS | Status: AC
  Filled 2014-10-09: qty 20

## 2014-10-09 MED ORDER — BISACODYL 10 MG RE SUPP: 10.0000 mg | Freq: Every day | RECTAL | Status: DC | PRN

## 2014-10-09 MED ORDER — CEFAZOLIN SODIUM-DEXTROSE 2-3 GM-% IV SOLR
2.0000 g | Freq: Four times a day (QID) | INTRAVENOUS | Status: AC
  Administered 2014-10-09 – 2014-10-10 (×2): 2 g via INTRAVENOUS
  Filled 2014-10-09 (×2): qty 50

## 2014-10-09 MED ORDER — LACTATED RINGERS IV SOLN
INTRAVENOUS | Status: DC
  Administered 2014-10-09: 15:00:00 via INTRAVENOUS

## 2014-10-09 MED ORDER — LABETALOL HCL 5 MG/ML IV SOLN
INTRAVENOUS | Status: DC | PRN
  Administered 2014-10-09 (×2): 5 mg via INTRAVENOUS

## 2014-10-09 MED ORDER — LACTATED RINGERS IV SOLN
INTRAVENOUS | Status: DC | PRN
  Administered 2014-10-09 (×2): via INTRAVENOUS

## 2014-10-09 MED ORDER — FENTANYL CITRATE 0.05 MG/ML IJ SOLN
INTRAMUSCULAR | Status: AC
  Filled 2014-10-09: qty 5

## 2014-10-09 MED ORDER — ROCURONIUM BROMIDE 50 MG/5ML IV SOLN
INTRAVENOUS | Status: AC
  Filled 2014-10-09: qty 1

## 2014-10-09 MED ORDER — FENTANYL CITRATE 0.05 MG/ML IJ SOLN
INTRAMUSCULAR | Status: DC | PRN
  Administered 2014-10-09 (×8): 50 ug via INTRAVENOUS

## 2014-10-09 MED ORDER — ONDANSETRON HCL 4 MG/2ML IJ SOLN: 4.0000 mg | Freq: Four times a day (QID) | INTRAMUSCULAR | Status: DC | PRN

## 2014-10-09 MED ORDER — ONDANSETRON HCL 4 MG PO TABS: 4.0000 mg | ORAL_TABLET | Freq: Four times a day (QID) | ORAL | Status: DC | PRN

## 2014-10-09 MED ORDER — VECURONIUM BROMIDE 10 MG IV SOLR
INTRAVENOUS | Status: DC | PRN
  Administered 2014-10-09: 1 mg via INTRAVENOUS
  Administered 2014-10-09: 2 mg via INTRAVENOUS

## 2014-10-09 MED ORDER — ENOXAPARIN SODIUM 40 MG/0.4ML ~~LOC~~ SOLN
40.0000 mg | SUBCUTANEOUS | Status: DC
  Administered 2014-10-10 – 2014-10-11 (×2): 40 mg via SUBCUTANEOUS
  Filled 2014-10-09 (×2): qty 0.4

## 2014-10-09 MED ORDER — NEOSTIGMINE METHYLSULFATE 10 MG/10ML IV SOLN
INTRAVENOUS | Status: DC | PRN
  Administered 2014-10-09: 4 mg via INTRAVENOUS

## 2014-10-09 MED ORDER — MIDAZOLAM HCL 5 MG/5ML IJ SOLN
INTRAMUSCULAR | Status: DC | PRN
  Administered 2014-10-09: 1 mg via INTRAVENOUS

## 2014-10-09 MED ORDER — LIDOCAINE HCL (CARDIAC) 20 MG/ML IV SOLN
INTRAVENOUS | Status: AC
  Filled 2014-10-09: qty 5

## 2014-10-09 MED ORDER — FENTANYL CITRATE 0.05 MG/ML IJ SOLN: 25.0000 ug | INTRAMUSCULAR | Status: DC | PRN

## 2014-10-09 MED ORDER — ONDANSETRON HCL 4 MG/2ML IJ SOLN
INTRAMUSCULAR | Status: AC
  Filled 2014-10-09: qty 2

## 2014-10-09 MED ORDER — GLYCOPYRROLATE 0.2 MG/ML IJ SOLN
INTRAMUSCULAR | Status: DC | PRN
  Administered 2014-10-09: .8 mg via INTRAVENOUS

## 2014-10-09 MED ORDER — SENNOSIDES-DOCUSATE SODIUM 8.6-50 MG PO TABS: 1.0000 | ORAL_TABLET | Freq: Every evening | ORAL | Status: DC | PRN

## 2014-10-09 MED ORDER — ROCURONIUM BROMIDE 100 MG/10ML IV SOLN
INTRAVENOUS | Status: DC | PRN
  Administered 2014-10-09: 10 mg via INTRAVENOUS
  Administered 2014-10-09: 40 mg via INTRAVENOUS

## 2014-10-09 MED ORDER — PHENOL 1.4 % MT LIQD: 1.0000 | OROMUCOSAL | Status: DC | PRN

## 2014-10-09 MED ORDER — PROPOFOL 10 MG/ML IV BOLUS
INTRAVENOUS | Status: DC | PRN
  Administered 2014-10-09: 100 mg via INTRAVENOUS

## 2014-10-09 MED ORDER — LIDOCAINE HCL (CARDIAC) 20 MG/ML IV SOLN
INTRAVENOUS | Status: DC | PRN
  Administered 2014-10-09: 40 mg via INTRAVENOUS

## 2014-10-09 MED ORDER — DOCUSATE SODIUM 100 MG PO CAPS
100.0000 mg | ORAL_CAPSULE | Freq: Two times a day (BID) | ORAL | Status: DC
  Administered 2014-10-09 – 2014-10-11 (×4): 100 mg via ORAL
  Filled 2014-10-09 (×4): qty 1

## 2014-10-09 MED ORDER — METOCLOPRAMIDE HCL 5 MG/ML IJ SOLN: 5.0000 mg | Freq: Three times a day (TID) | INTRAMUSCULAR | Status: DC | PRN

## 2014-10-09 MED ORDER — METOCLOPRAMIDE HCL 5 MG PO TABS: 5.0000 mg | ORAL_TABLET | Freq: Three times a day (TID) | ORAL | Status: DC | PRN

## 2014-10-09 MED ORDER — MENTHOL 3 MG MT LOZG: 1.0000 | LOZENGE | OROMUCOSAL | Status: DC | PRN

## 2014-10-09 MED ORDER — ACETAMINOPHEN 325 MG PO TABS
650.0000 mg | ORAL_TABLET | Freq: Four times a day (QID) | ORAL | Status: DC | PRN
  Administered 2014-10-11: 650 mg via ORAL
  Filled 2014-10-09: qty 2

## 2014-10-09 SURGICAL SUPPLY — 54 items
BLADE SAW SAG 73X25 THK (BLADE) ×2
BLADE SAW SGTL 73X25 THK (BLADE) ×1 IMPLANT
BRUSH SCRUB DISP (MISCELLANEOUS) ×3 IMPLANT
CAPT HIP FX BIPOLAR/UNIPOLAR ×3 IMPLANT
COVER SURGICAL LIGHT HANDLE (MISCELLANEOUS) ×6 IMPLANT
DRAPE INCISE IOBAN 85X60 (DRAPES) ×6 IMPLANT
DRAPE ORTHO SPLIT 77X108 STRL (DRAPES) ×4
DRAPE SURG ORHT 6 SPLT 77X108 (DRAPES) ×2 IMPLANT
DRAPE U-SHAPE 47X51 STRL (DRAPES) ×3 IMPLANT
DRILL BIT 7/64X5 (BIT) ×3 IMPLANT
DRSG ADAPTIC 3X8 NADH LF (GAUZE/BANDAGES/DRESSINGS) ×3 IMPLANT
DRSG MEPILEX BORDER 4X8 (GAUZE/BANDAGES/DRESSINGS) ×3 IMPLANT
DRSG PAD ABDOMINAL 8X10 ST (GAUZE/BANDAGES/DRESSINGS) ×3 IMPLANT
ELECT BLADE 6.5 EXT (BLADE) ×3 IMPLANT
ELECT CAUTERY BLADE 6.4 (BLADE) ×3 IMPLANT
ELECT REM PT RETURN 9FT ADLT (ELECTROSURGICAL) ×3
ELECTRODE REM PT RTRN 9FT ADLT (ELECTROSURGICAL) ×1 IMPLANT
EVACUATOR 1/8 PVC DRAIN (DRAIN) IMPLANT
GAUZE SPONGE 4X4 12PLY STRL (GAUZE/BANDAGES/DRESSINGS) ×3 IMPLANT
GLOVE BIO SURGEON STRL SZ7.5 (GLOVE) ×3 IMPLANT
GLOVE BIO SURGEON STRL SZ8 (GLOVE) ×3 IMPLANT
GLOVE BIOGEL PI IND STRL 7.5 (GLOVE) ×1 IMPLANT
GLOVE BIOGEL PI IND STRL 8 (GLOVE) ×1 IMPLANT
GLOVE BIOGEL PI INDICATOR 7.5 (GLOVE) ×2
GLOVE BIOGEL PI INDICATOR 8 (GLOVE) ×2
GOWN STRL REUS W/ TWL LRG LVL3 (GOWN DISPOSABLE) ×2 IMPLANT
GOWN STRL REUS W/ TWL XL LVL3 (GOWN DISPOSABLE) ×2 IMPLANT
GOWN STRL REUS W/TWL 2XL LVL3 (GOWN DISPOSABLE) IMPLANT
GOWN STRL REUS W/TWL LRG LVL3 (GOWN DISPOSABLE) ×4
GOWN STRL REUS W/TWL XL LVL3 (GOWN DISPOSABLE) ×4
IMMOBILIZER KNEE 20 (SOFTGOODS) IMPLANT
IMMOBILIZER KNEE 22 UNIV (SOFTGOODS) ×3 IMPLANT
KIT BASIN OR (CUSTOM PROCEDURE TRAY) ×3 IMPLANT
KIT ROOM TURNOVER OR (KITS) ×3 IMPLANT
MANIFOLD NEPTUNE II (INSTRUMENTS) ×3 IMPLANT
NEEDLE 1/2 CIR MAYO (NEEDLE) IMPLANT
NS IRRIG 1000ML POUR BTL (IV SOLUTION) ×3 IMPLANT
PACK TOTAL JOINT (CUSTOM PROCEDURE TRAY) ×3 IMPLANT
PAD ARMBOARD 7.5X6 YLW CONV (MISCELLANEOUS) ×6 IMPLANT
SPONGE LAP 18X18 X RAY DECT (DISPOSABLE) ×3 IMPLANT
STAPLER VISISTAT 35W (STAPLE) ×3 IMPLANT
SUCTION FRAZIER TIP 10 FR DISP (SUCTIONS) ×3 IMPLANT
SUT ETHILON 2 0 FS 18 (SUTURE) ×3 IMPLANT
SUT FIBERWIRE #2 38 T-5 BLUE (SUTURE) ×6
SUT VIC AB 1 CT1 27 (SUTURE) ×4
SUT VIC AB 1 CT1 27XBRD ANBCTR (SUTURE) ×2 IMPLANT
SUT VIC AB 2-0 CT1 27 (SUTURE) ×4
SUT VIC AB 2-0 CT1 TAPERPNT 27 (SUTURE) ×2 IMPLANT
SUT VICRYL  1 CTB 1 POPO (SUTURE) ×3 IMPLANT
SUTURE FIBERWR #2 38 T-5 BLUE (SUTURE) ×2 IMPLANT
TOWEL OR 17X24 6PK STRL BLUE (TOWEL DISPOSABLE) ×3 IMPLANT
TOWEL OR 17X26 10 PK STRL BLUE (TOWEL DISPOSABLE) ×6 IMPLANT
TRAY FOLEY CATH 16FRSI W/METER (SET/KITS/TRAYS/PACK) ×3 IMPLANT
WATER STERILE IRR 1000ML POUR (IV SOLUTION) IMPLANT

## 2014-10-09 NOTE — Progress Notes (Signed)
CARE MANAGEMENT NOTE 10/09/2014  Patient:  Christopher Summers, Christopher Summers   Account Number:  [id number]  Date Initiated:  10/09/2014  Documentation initiated by:  Olga Coaster  Subjective/Objective Assessment:   PATIENT SCHEDULED FOR SURGERY / HIP FRACTURE     Action/Plan:   CM FOLLOWING FOR DCP   Anticipated DC Date:  10/13/2014   Anticipated DC Plan:  PATIENT IS SCHEDULED FOR SURGERY TODAY; CM FOLLOWING FOR DCP     DC Planning Services  CM consult         Status of service:  In process, will continue to follow Medicare Important Message given?   (If response is "NO", the following Medicare IM given date fields will be blank)  Per UR Regulation:  Reviewed for med. necessity/level of care/duration of stay  Comments:  10/27/2015Mindi Slicker RN,BSN,MHA [phone]

## 2014-10-09 NOTE — Progress Notes (Signed)
I have reviewed Mr. Highbaugh presentation and case with Dr. Ronnie Derby and have agreed to assume management.    The risks and benefits of surgery were discussed with the family, including the possibility of infection, nerve injury, vessel injury, wound breakdown, arthritis, symptomatic hardware, DVT/ PE, loss of motion, and need for further surgery among others.  They understood these risks and wished to proceed.  Altamese Foxworth, MD [facility] [phone] (p)

## 2014-10-09 NOTE — Progress Notes (Signed)
Patient arrived to 4N19 asleep. Vital signs were taken and fluids running. Family in the room and bed alarm activated. Will continue to monitor. Reese Senk, Rande Brunt

## 2014-10-09 NOTE — Op Note (Signed)
NAME:  Christopher Summers, Christopher Summers NO.:  [id number]  MEDICAL RECORD NO.:  [id number]  LOCATION:  [id number]                        FACILITY:  [facility]  PHYSICIAN:  Astrid Divine. Marcelino Scot, M.D. DATE OF BIRTH:  12-30-1940  DATE OF PROCEDURE:  10/09/2014 DATE OF DISCHARGE:                              OPERATIVE REPORT   PREOPERATIVE DIAGNOSIS:  Right femoral neck fracture.  POSTOPERATIVE DIAGNOSIS:  Right femoral neck fracture.  PROCEDURE:  Right hip hemiarthroplasty using a [employer] [place] press-fit #4 stem, standard neck 50-mm unipolar head.  SURGEON:  Astrid Divine. Marcelino Scot, M.D.  ASSISTANT:  Jari Pigg, PA-C.  ANESTHESIA:  General.  COMPLICATIONS:  None.  I/O:  1000 mL of crystalloid/UOP 600 mL, EBL 250 mL.  DISPOSITION:  To PACU.  CONDITION:  Stable.  BRIEF SUMMARY OF INDICATION FOR PROCEDURE:  Christopher Summers is a 73 year old male with dementia, who sustained a fall from a ladder, resulting in a displaced varus angulated right femoral neck fracture.  Discussed with his wife, sister and other family the risks and benefits of hemiarthroplasty versus attempted repair and recommended hemiarthroplasty given the patient's dementia and inability to follow instructions as well as the displaced and varus nature of the fracture. We discussed the possible risks to include, nerve injury, vessel injury, infection, limb length inequality, instability, heart attack, stroke, and DVT, PE, among others.  They did wish to proceed.  BRIEF SUMMARY OF PROCEDURE:  Christopher Summers was given preoperative antibiotics, taken to the operating room where general anesthesia was induced.  His right hip was prepped and draped in usual sterile fashion after placing him right side up and padding all prominences appropriately.  Standard Southern approach was made through a curvilinear incision, brining the hip into flexion and placing the Charnley retractor for the tensor.  This exposed the piriformis and short  rotators, which were divided at their insertions.  The capsule was then teed.  The neck cut refined with a saw blade and then, the head withdrawn and measured to 50 mm.  I trialed both the 50 and the 51 finding the best fit with the 50 as the 51 was slightly too large.  The ligamentum was debrided from the joint.  Mueller retractor was placed on the neck of the femur and then the canal finder, lateralizer and box cutter were used to establish appropriate trajectory in starting point for the stem.  Sequential reaming was performed up to 5, sequential broaching up to 4, which gave an outstanding fit and fill with no rotational instability.  Trial components were placed, finding outstanding range of motion with excellent stability at the means of internal rotation, adduction and flexion as well as external rotation and abduction.  I was unable to dislocate the hip while the patient was under anesthesia during those parameters after placement of the actual components as well.  This consisted of the components as noted above. Wound was irrigated thoroughly and closed in standard layered fashion using #1 Vicryl for the capsule, #2 FiberWire for the piriformis and short rotators back through bone tunnels, #1 Vicryl for the tensor and then 2-0 Vicryl and 3-0 nylon for the skin.  Sterile gently compressive dressing was applied and  then a knee immobilizer to the right leg to prevent the patient from easily going into positions that might violate his hip precautions.  Ainsley Spinner, assisted me throughout and his assistance was absolutely necessary to control the hip for exposure, placement of the trial and actual components.  He also assisted me with closure.  PROGNOSIS:  Christopher Summers will be weightbearing as tolerated with posterior hip precautions.  I anticipate he will need a skilled nursing facility stay given his dementia and Social Work be consulted to assist the patient and his family with  long-term arrangements.  He is at increased risk for complications including dislocation because of his inability to follow instructions.     Astrid Divine. Marcelino Scot, M.D.     MHH/MEDQ  D:  10/09/2014  T:  10/09/2014  Job:  [id number]

## 2014-10-09 NOTE — Anesthesia Preprocedure Evaluation (Addendum)
Anesthesia Evaluation  Patient identified by MRN, date of birth, ID band Patient awake and Patient confused    Reviewed: Allergy & Precautions, H&P , NPO status , Patient's Chart, lab work & pertinent test results  History of Anesthesia Complications Negative for: history of anesthetic complications  Airway Mallampati: II  TM Distance: >3 FB Neck ROM: Full    Dental  (+) Missing, Chipped, Poor Dentition, Dental Advisory Given   Pulmonary Current Smoker,  breath sounds clear to auscultation        Cardiovascular hypertension (borderline, no meds), Rhythm:Regular Rate:Normal     Neuro/Psych Depression Alzheimer's dementianegative neurological ROS     GI/Hepatic Neg liver ROS, GERD-  Controlled,  Endo/Other  negative endocrine ROS  Renal/GU negative Renal ROS     Musculoskeletal   Abdominal   Peds  Hematology negative hematology ROS (+)   Anesthesia Other Findings   Reproductive/Obstetrics                           Anesthesia Physical Anesthesia Plan  ASA: III  Anesthesia Plan: General   Post-op Pain Management:    Induction: Intravenous  Airway Management Planned: Oral ETT  Additional Equipment:   Intra-op Plan:   Post-operative Plan: Extubation in OR  Informed Consent: I have reviewed the patients History and Physical, chart, labs and discussed the procedure including the risks, benefits and alternatives for the proposed anesthesia with the patient or authorized representative who has indicated his/her understanding and acceptance.   Dental advisory given  Plan Discussed with: CRNA and Surgeon  Anesthesia Plan Comments: (Plan routine monitors, GETA)        Anesthesia Quick Evaluation

## 2014-10-09 NOTE — Brief Op Note (Signed)
10/08/2014 - 10/09/2014  6:32 PM  PATIENT:  Christopher Summers  73 y.o. male  PRE-OPERATIVE DIAGNOSIS:  Right Femoral Neck Fx  POST-OPERATIVE DIAGNOSIS:  Right Femoral Neck Fx  PROCEDURE:  Procedure(s): Right Hemiarthroplasty (Right) with [employer], #4 stem, standard neck, 50mm unipolar head  SURGEON:  Surgeon(s) and Role:    * Rozanna Box, MD - Primary  PHYSICIAN ASSISTANT: Ainsley Spinner, PA-C  ANESTHESIA:   general  I/O:  Total I/O In: 1000 [I.V.:1000] Out: 1000 [Urine:600; Blood:250]  SPECIMEN:  No Specimen  TOURNIQUET:  * No tourniquets in log *  DICTATION: .Other Dictation: Dictation Number [id number]

## 2014-10-09 NOTE — Progress Notes (Signed)
Patient ID: Christopher Summers  male  WVP:[id number]    DOB: March 30, 1941    DOA: 10/08/2014  PCP: Unice Cobble, MD  HPI: Christopher Summers is a 73 y.o. male with a past medical history of Alzheimer's dementia who was in his usual state of health until earlier today when he was climbing a ladder to blow leaves off of his roof. Subsequently he fell off the ladder after losing balance. The patient was complaining of pain in his right hip and leg. X-rays showed nondisplaced right femoral neck fracture. Orthopedics consulted  Assessment/Plan: Principal Problem:   Hip fracture, right - Orthopedics consulted, possible OR today, nothing by mouth  Active Problems:   Alzheimer's dementia - Mental status at baseline per wife - Continue Aricept, Depakote, Namenda, Zoloft    Preoperative evaluation - Currently stable to undergo surgery   DVT Prophylaxis:  Code Status:  Family Communication: Discussed with patient's wife at the bedside  Disposition:  Consultants:  Orthopedics  Procedures:  None  Antibiotics:  Ancef preop    Subjective: Patient seen and examined, wife at the bedside, resting comfortably  Objective: Weight change:   Intake/Output Summary (Last 24 hours) at 10/09/14 1258 Last data filed at 10/09/14 1152  Gross per 24 hour  Intake      0 ml  Output    150 ml  Net   -150 ml   Blood pressure 157/74, pulse 63, temperature 99.4 F (37.4 C), temperature source Oral, resp. rate 15, height 5\' 11"  (1.803 m), weight 69.5 kg (153 lb 3.5 oz), SpO2 97.00%.  Physical Exam: General: Resting, NAD, comfortable CVS: S1-S2 clear, no murmur rubs or gallops Chest: clear to auscultation bilaterally, no wheezing, rales or rhonchi Abdomen: soft nontender, nondistended, normal bowel sounds  Extremities: no cyanosis, clubbing or edema noted bilaterally   Lab Results: Basic Metabolic Panel:  Recent Labs Lab 10/08/14 1504 10/09/14 0516  NA 142 139  K 3.8 3.7  CL 103  103  CO2  --  21  GLUCOSE 108* 120*  BUN 10 9  CREATININE 1.00 0.93  CALCIUM  --  9.1   Liver Function Tests: No results found for this basename: AST, ALT, ALKPHOS, BILITOT, PROT, ALBUMIN,  in the last 168 hours No results found for this basename: LIPASE, AMYLASE,  in the last 168 hours No results found for this basename: AMMONIA,  in the last 168 hours CBC:  Recent Labs Lab 10/08/14 1455 10/08/14 1504 10/09/14 0516  WBC 9.5  --  9.8  NEUTROABS 7.8*  --   --   HGB 13.9 16.0 11.8*  HCT 42.9 47.0 36.3*  MCV 91.7  --  90.5  PLT 228  --  228   Cardiac Enzymes: No results found for this basename: CKTOTAL, CKMB, CKMBINDEX, TROPONINI,  in the last 168 hours BNP: No components found with this basename: POCBNP,  CBG: No results found for this basename: GLUCAP,  in the last 168 hours   Micro Results: Recent Results (from the past 240 hour(s))  SURGICAL PCR SCREEN     Status: None   Collection Time    10/09/14  8:07 AM      Result Value Ref Range Status   MRSA, PCR NEGATIVE  NEGATIVE Final   Staphylococcus aureus NEGATIVE  NEGATIVE Final   Comment:            The Xpert SA Assay (FDA     approved for NASAL specimens     in patients over  19 years of age),     is one component of     a comprehensive surveillance     program.  Test performance has     been validated by [employer] for patients greater     than or equal to 9 year old.     It is not intended     to diagnose infection nor to     guide or monitor treatment.    Studies/Results: Dg Chest 1 View  10/08/2014   CLINICAL DATA:  [place] off a ladder.  Mental status change.  EXAM: CHEST - 1 VIEW  COMPARISON:  05/18/2010  FINDINGS: The cardiac silhouette, mediastinal and hilar contours are within normal limits. There is tortuosity and calcification of the thoracic aorta. The lungs are clear. No pleural effusion. The bony thorax is intact.  IMPRESSION: No acute cardiopulmonary findings and intact bony thorax.    Electronically Signed   By: Kalman Jewels M.D.   On: 10/08/2014 14:41   Dg Hip Complete Right  10/08/2014   CLINICAL DATA:  [place] off ladder today.  Right hip pain  EXAM: RIGHT HIP - COMPLETE 2+ VIEW  COMPARISON:  None.  FINDINGS: Right femoral neck fracture. No significant displacement. Right hip joint is normal.  IMPRESSION: Nondisplaced fracture right femoral neck.   Electronically Signed   By: Franchot Gallo M.D.   On: 10/08/2014 14:35   Dg Femur Right  10/08/2014   CLINICAL DATA:  [place] off ladder today  EXAM: RIGHT FEMUR - 2 VIEW  COMPARISON:  Right hip from today  FINDINGS: Right femoral neck fracture. No other fracture of the femur. The knee joint appears normal.  IMPRESSION: Femoral neck fracture.   Electronically Signed   By: Franchot Gallo M.D.   On: 10/08/2014 14:36   Ct Head Wo Contrast  10/08/2014   CLINICAL DATA:  Unwitnessed fall.  Patient found on ground.  EXAM: CT HEAD WITHOUT CONTRAST  CT CERVICAL SPINE WITHOUT CONTRAST  TECHNIQUE: Multidetector CT imaging of the head and cervical spine was performed following the standard protocol without intravenous contrast. Multiplanar CT image reconstructions of the cervical spine were also generated.  COMPARISON:  05/18/2010 -CT C spine  FINDINGS: CT HEAD FINDINGS  There is no evidence of mass effect, midline shift, or extra-axial fluid collections. There is no evidence of a space-occupying lesion or intracranial hemorrhage. There is no evidence of a cortical-based area of acute infarction. There is generalized cerebral atrophy. There is periventricular white matter low attenuation likely secondary to microangiopathy.  The ventricles and sulci are appropriate for the patient's age. The basal cisterns are patent.  Visualized portions of the orbits are unremarkable. There is mild right sphenoid sinus mucosal thickening. Cerebrovascular atherosclerotic calcifications are noted.  The osseous structures are unremarkable.  CT CERVICAL SPINE FINDINGS   The alignment is anatomic. The vertebral body heights are maintained. Incidental note is made of incomplete fusion of the posterior arch of C1. There is no acute fracture. There is no static listhesis. The prevertebral soft tissues are normal. The intraspinal soft tissues are not fully imaged on this examination due to poor soft tissue contrast, but there is no gross soft tissue abnormality.  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 There is degenerative disc disease at C3-4, C4-5 C6-7. Bilateral uncovertebral degenerative changes at C3-4. Mild broad-based disc bulge at C4-5. Mild broad-based disc osteophyte complex at C5-6 with bilateral uncovertebral degenerative changes resulting in mild left foraminal  encroachment. Mild broad-based disc bulge at C6-7 with bilateral uncovertebral degenerative changes.  The visualized portions of the lung apices demonstrate no focal abnormality.  IMPRESSION: 1. No acute intracranial pathology. 2. No acute osseous injury of the cervical spine.   Electronically Signed   By: Kathreen Devoid   On: 10/08/2014 14:49   Ct Cervical Spine Wo Contrast  10/08/2014   CLINICAL DATA:  Unwitnessed fall.  Patient found on ground.  EXAM: CT HEAD WITHOUT CONTRAST  CT CERVICAL SPINE WITHOUT CONTRAST  TECHNIQUE: Multidetector CT imaging of the head and cervical spine was performed following the standard protocol without intravenous contrast. Multiplanar CT image reconstructions of the cervical spine were also generated.  COMPARISON:  05/18/2010 -CT C spine  FINDINGS: CT HEAD FINDINGS  There is no evidence of mass effect, midline shift, or extra-axial fluid collections. There is no evidence of a space-occupying lesion or intracranial hemorrhage. There is no evidence of a cortical-based area of acute infarction. There is generalized cerebral atrophy. There is periventricular white matter low attenuation likely secondary to microangiopathy.  The ventricles and sulci are appropriate for the  patient's age. The basal cisterns are patent.  Visualized portions of the orbits are unremarkable. There is mild right sphenoid sinus mucosal thickening. Cerebrovascular atherosclerotic calcifications are noted.  The osseous structures are unremarkable.  CT CERVICAL SPINE FINDINGS  The alignment is anatomic. The vertebral body heights are maintained. Incidental note is made of incomplete fusion of the posterior arch of C1. There is no acute fracture. There is no static listhesis. The prevertebral soft tissues are normal. The intraspinal soft tissues are not fully imaged on this examination due to poor soft tissue contrast, but there is no gross soft tissue abnormality.  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 There is degenerative disc disease at C3-4, C4-5 C6-7. Bilateral uncovertebral degenerative changes at C3-4. Mild broad-based disc bulge at C4-5. Mild broad-based disc osteophyte complex at C5-6 with bilateral uncovertebral degenerative changes resulting in mild left foraminal encroachment. Mild broad-based disc bulge at C6-7 with bilateral uncovertebral degenerative changes.  The visualized portions of the lung apices demonstrate no focal abnormality.  IMPRESSION: 1. No acute intracranial pathology. 2. No acute osseous injury of the cervical spine.   Electronically Signed   By: Kathreen Devoid   On: 10/08/2014 14:49    Medications: Scheduled Meds: .  ceFAZolin (ANCEF) IV  2 g Intravenous On Call to OR  . divalproex  250 mg Oral QPC lunch  . docusate sodium  100 mg Oral BID  . donepezil  10 mg Oral QHS  . memantine  10 mg Oral Daily  . sertraline  50 mg Oral Daily      LOS: 1 day   RAI,RIPUDEEP M.D. [facility] 10/09/2014, 12:58 PM Pager: [phone]  If 7PM-7AM, please contact night-coverage [email or web address] Password [email or web address]

## 2014-10-09 NOTE — Progress Notes (Signed)
INITIAL NUTRITION ASSESSMENT  DOCUMENTATION CODES Per approved criteria  -Not Applicable   INTERVENTION: Provide Ensure Complete BID when diet advances RD to continue to monitor PO intake for adequacy  NUTRITION DIAGNOSIS: Predicted sub optimal energy intake related to surgery and dementia as evidenced by patient's chart.   Goal: Pt to meet >/= 90% of their estimated nutrition needs   Monitor:  Diet advancement, PO intake, weight trend, labs  Reason for Assessment: Consult for Assessment  73 y.o. male  Admitting Dx: Hip fracture, right  ASSESSMENT: 73 y.o. male with a past medical history of Alzheimer's dementia who was in his usual state of health until earlier today when he was climbing a ladder to blow leaves off of his roof. Subsequently he fell off the ladder after losing balance. There was no loss of consciousness. Patient has advanced Alzheimers dementia and doesn't recall the event.   Pt out of room for surgery at time of visit. No family present. NPO since admission yesterday. Limited weight history but, no evidence of weight loss. RD to follow-up after surgery.  Labs reviewed.   Height: Ht Readings from Last 1 Encounters:  10/08/14 5\' 11"  (1.803 m)    Weight: Wt Readings from Last 1 Encounters:  10/08/14 153 lb 3.5 oz (69.5 kg)    Ideal Body Weight: 172 lbs  % Ideal Body Weight: 89%  Wt Readings from Last 10 Encounters:  10/08/14 153 lb 3.5 oz (69.5 kg)  10/08/14 153 lb 3.5 oz (69.5 kg)  01/29/12 163 lb 6 oz (74.106 kg)  10/05/07 154 lb 8 oz (70.081 kg)    Usual Body Weight: unknown  % Usual Body Weight: NA  BMI:  Body mass index is 21.38 kg/(m^2).  Estimated Nutritional Needs: Kcal: 2620-3559 Protein: 85-95 grams Fluid: 1.7-1.9 L/day  Skin: intact  Diet Order: NPO  EDUCATION NEEDS: -No education needs identified at this time   Intake/Output Summary (Last 24 hours) at 10/09/14 1530 Last data filed at 10/09/14 1152  Gross per 24 hour   Intake      0 ml  Output    150 ml  Net   -150 ml    Last BM: 10/26   Labs:   Recent Labs Lab 10/08/14 1504 10/09/14 0516  NA 142 139  K 3.8 3.7  CL 103 103  CO2  --  21  BUN 10 9  CREATININE 1.00 0.93  CALCIUM  --  9.1  GLUCOSE 108* 120*    CBG (last 3)  No results found for this basename: GLUCAP,  in the last 72 hours  Scheduled Meds: .  ceFAZolin (ANCEF) IV  2 g Intravenous On Call to OR  . [facility] HOLD] divalproex  250 mg Oral QPC lunch  . [facility] HOLD] docusate sodium  100 mg Oral BID  . [MAR HOLD] donepezil  10 mg Oral QHS  . [MAR HOLD] memantine  10 mg Oral Daily  . [facility] HOLD] sertraline  50 mg Oral Daily    Continuous Infusions: . lactated ringers 10 mL/hr at 10/09/14 1444    Past Medical History  Diagnosis Date  . BPH (benign prostatic hypertrophy)   . Depression   . Essential hypertension, benign   . Reflux   . Dementia     Past Surgical History  Procedure Laterality Date  . Transurethral resection of prostate      Pryor Ochoa RD, LDN Inpatient Clinical Dietitian Pager: [phone] After Hours Pager: [phone]

## 2014-10-09 NOTE — Anesthesia Postprocedure Evaluation (Signed)
Anesthesia Post Note  Patient: Christopher Summers  Procedure(s) Performed: Procedure(s) (LRB): Right Hemiarthroplasty (Right)  Anesthesia type: general  Patient location: PACU  Post pain: Pain level controlled  Post assessment: Patient's Cardiovascular Status Stable  Last Vitals:  Filed Vitals:   10/09/14 2053  BP: 155/76  Pulse: 77  Temp: 36.8 C  Resp: 18    Post vital signs: Reviewed and stable  Level of consciousness: sedated  Complications: No apparent anesthesia complications

## 2014-10-09 NOTE — Transfer of Care (Signed)
Immediate Anesthesia Transfer of Care Note  Patient: Christopher Summers  Procedure(s) Performed: Procedure(s): Right Hemiarthroplasty (Right)  Patient Location: PACU  Anesthesia Type:General  Level of Consciousness: awake, alert  and oriented  Airway & Oxygen Therapy: Patient Spontanous Breathing  Post-op Assessment: Report given to PACU RN  Post vital signs: Reviewed and stable  Complications: No apparent anesthesia complications

## 2014-10-10 ENCOUNTER — Inpatient Hospital Stay (HOSPITAL_COMMUNITY): Payer: Medicare Other

## 2014-10-10 DIAGNOSIS — F0391 Unspecified dementia with behavioral disturbance: Secondary | ICD-10-CM

## 2014-10-10 LAB — BASIC METABOLIC PANEL
Anion gap: 13 (ref 5–15)
BUN: 9 mg/dL (ref 6–23)
CO2: 24 mEq/L (ref 19–32)
CREATININE: 1.08 mg/dL (ref 0.50–1.35)
Calcium: 8.6 mg/dL (ref 8.4–10.5)
Chloride: 102 mEq/L (ref 96–112)
GFR, EST AFRICAN AMERICAN: 78 mL/min — AB (ref >90)
GFR, EST NON AFRICAN AMERICAN: 67 mL/min — AB (ref >90)
Glucose, Bld: 130 mg/dL — ABNORMAL HIGH (ref 70–99)
Potassium: 3.8 mEq/L (ref 3.7–5.3)
Sodium: 139 mEq/L (ref 137–147)

## 2014-10-10 LAB — CBC
HEMATOCRIT: 33.8 % — AB (ref 39.0–52.0)
Hemoglobin: 11.1 g/dL — ABNORMAL LOW (ref 13.0–17.0)
MCH: 29.8 pg (ref 26.0–34.0)
MCHC: 32.8 g/dL (ref 30.0–36.0)
MCV: 90.6 fL (ref 78.0–100.0)
PLATELETS: 206 10*3/uL (ref 150–400)
RBC: 3.73 MIL/uL — ABNORMAL LOW (ref 4.22–5.81)
RDW: 13.1 % (ref 11.5–15.5)
WBC: 11.1 10*3/uL — ABNORMAL HIGH (ref 4.0–10.5)

## 2014-10-10 NOTE — Evaluation (Signed)
Physical Therapy Evaluation Patient Details Name: Christopher Summers MRN: [id number] DOB: 1941/11/06 Today's Date: 10/10/2014   History of Present Illness  Arlan Birks is a 73 y.o. male s/p Right arthroplasty bipolar hip on 10/09/14. PMH of advanced dementia who fell from a ladder at home while blowing leaves from his roof. WBAT RLE.    Clinical Impression  Patient presents with post surgical deficits and pain through RLE s/p R THA impacting safe mobility. Pt with baseline cognitive deficits affecting safety awareness/judgment and insight into surgery/pain. Pt resisting therapists throughout mobility. Requires total A to transfer to chair due to safety concerns and impaired cognition. Not able to comprehend hip precautions. Wife educated on precautions and handout given. Pt high falls risk. Pt would benefit from acute PT and follow up ST SNF to improve transfers, gait, balance and overall mobility so pt can maximize independence and ease burden of care prior to return home.     Follow Up Recommendations SNF;Supervision/Assistance - 24 hour    Equipment Recommendations  Other (comment) (defer to SNF)    Recommendations for Other Services       Precautions / Restrictions Precautions Precautions: Posterior Hip;Fall Precaution Booklet Issued: Yes (comment) Precaution Comments: Educated wife on hip precautions as pt not able to comprehend. Required Braces or Orthoses: Knee Immobilizer - Right Knee Immobilizer - Right: On at all times Restrictions Weight Bearing Restrictions: Yes RLE Weight Bearing: Weight bearing as tolerated      Mobility  Bed Mobility Overal bed mobility: Needs Assistance;+2 for physical assistance Bed Mobility: Supine to Sit     Supine to sit: Max assist;+2 for physical assistance;HOB elevated     General bed mobility comments: Max A to mobilize RLE, trunk and bottom to EOB with pt actively resisting therapists during transfer, "wait a min, wait a  minute."   Transfers Overall transfer level: Needs assistance Equipment used: None Transfers: Squat Pivot Transfers     Squat pivot transfers: Total assist;+2 physical assistance;From elevated surface     General transfer comment: Attempted to stand x3 however pt not able to achieve full standing position due to resisting therapists. Squat pivot transfer to left with assist x2 due to resistance/pain. Increased time to perform any transfer/mobility.  Ambulation/Gait                Stairs            Wheelchair Mobility    Modified Rankin (Stroke Patients Only)       Balance   Sitting-balance support: Feet supported;Bilateral upper extremity supported Sitting balance-Leahy Scale: Fair Sitting balance - Comments: Requires BUE support sitting EOB on RW vs bed, holding onto rail for support. Difficulty assessing balance without UE support due to cognition.     Standing balance-Leahy Scale: Zero Standing balance comment: Not able to achieve standing position due to resistance towards therapists/pain.                              Pertinent Vitals/Pain Pain Assessment: Faces Faces Pain Scale: Hurts whole lot Pain Location: right leg Pain Descriptors / Indicators: Sore ("it hurts") Pain Intervention(s): Limited activity within patient's tolerance;Premedicated before session;Repositioned;Monitored during session    Home Living Family/patient expects to be discharged to:: Skilled nursing facility Living Arrangements: Spouse/significant other                    Prior Function Level of Independence: Independent  Hand Dominance        Extremity/Trunk Assessment   Upper Extremity Assessment: Overall WFL for tasks assessed           Lower Extremity Assessment: RLE deficits/detail;Generalized weakness;Difficult to assess due to impaired cognition      Cervical / Trunk Assessment: Normal  Communication    Communication: No difficulties  Cognition Arousal/Alertness: Awake/alert Behavior During Therapy: Restless Overall Cognitive Status: History of cognitive impairments - at baseline       Memory: Decreased recall of precautions;Decreased short-term memory              General Comments General comments (skin integrity, edema, etc.): KI donned during transfer. Surgical site- clean, dry and intact.     Exercises        Assessment/Plan    PT Assessment Patient needs continued PT services  PT Diagnosis Acute pain;Generalized weakness;Difficulty walking   PT Problem List Decreased strength;Pain;Decreased range of motion;Decreased cognition;Decreased activity tolerance;Decreased balance;Decreased safety awareness;Decreased knowledge of precautions;Decreased mobility  PT Treatment Interventions Balance training;Gait training;DME instruction;Cognitive remediation;Patient/family education;Functional mobility training;Therapeutic activities;Therapeutic exercise;Wheelchair mobility training   PT Goals (Current goals can be found in the Care Plan section) Acute Rehab PT Goals Patient Stated Goal: not stated PT Goal Formulation: Patient unable to participate in goal setting Time For Goal Achievement: 10/24/14 Potential to Achieve Goals: Fair    Frequency 7X/week   Barriers to discharge        Co-evaluation               End of Session Equipment Utilized During Treatment: Gait belt Activity Tolerance: Patient limited by pain Patient left: in chair;with call bell/phone within reach;with chair alarm set;with family/visitor present (with lift pad under patient for easy transfer back to bed.) Nurse Communication: Mobility status;Weight bearing status;Precautions;Need for lift equipment         Time: 1003-1033 PT Time Calculation (min): 30 min   Charges:   PT Evaluation $Initial PT Evaluation Tier I: 1 Procedure PT Treatments $Therapeutic Activity: 8-22 mins   PT G  CodesCandy Sledge A 10/10/2014, 11:37 AM Candy Sledge, PT, DPT [phone]

## 2014-10-10 NOTE — Evaluation (Signed)
Clinical/Bedside Swallow Evaluation Patient Details  Name: Christopher Summers MRN: [id number] Date of Birth: [birth year]  Today's Date: 10/10/2014 Time: 5003-7048 SLP Time Calculation (min): 24 min  Past Medical History:  Past Medical History  Diagnosis Date  . BPH (benign prostatic hypertrophy)   . Depression   . Essential hypertension, benign   . Reflux   . Dementia    Past Surgical History:  Past Surgical History  Procedure Laterality Date  . 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  PMH + for alzheimiers disease, smoking, severe disc disease per cervical spine imaging study.  CT head negative for acute change, atrophy noted.  CXR negative upon admit.  Swallow evaluation ordered. Spouse present and denies pt having issues with swallowing - however he "eats like a bird" at home consuming small bolus sizes and amounts.      Assessment / Plan / Recommendation Clinical Impression  Pt presents with functional oropharyngeal swallow ability based on clinical swallow evaluation.  He was able to self feed and fortunately naturally takes small bites/sips per spouse.  Swallow was timely with clear voice throughout and no indication of airway compromise.  Spouse states getting pt to take medicines at home is "an ordeal" and he requires plenty of water.  Question if cervical spine dx, osteophytes may impact pharyngeal clearance but no overt indications of deficits observed.    SLP educated pt/spouse to alternative medicine administration strategies.  Further provided spouse with heimlich manuever in writing for general education purposes given pt has dementia.  Recommend regular/thin diet, no furhter SLP indicated.      Aspiration Risk  Mild    Diet Recommendation Regular;Thin liquid   Liquid Administration via: Cup;Straw Medication Administration: Whole meds with  liquid Supervision: Patient able to self feed Compensations: Slow rate;Small sips/bites Postural Changes and/or Swallow Maneuvers: Seated upright 90 degrees    Other  Recommendations Oral Care Recommendations: Oral care BID   Follow Up Recommendations    n/a   Frequency and Duration   n/a     Pertinent Vitals/Pain Afebrile, decreased     Swallow Study Prior Functional Status   see [place] Date of Onset: 10/10/14 HPI: 73 yo male adm to [facility] after falling off roof 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  PMH + for alzheimiers disease, smoking, severe disc disease per cervical spine imaging study.  CT head negative for acute change, atrophy noted.  CXR negative upon admit.  Swallow evaluation ordered. Spouse present and denies pt having issues with swallowing - however he "eats like a bird" at home consuming small bolus sizes and amounts.    Type of Study: Bedside swallow evaluation Diet Prior to this Study: Thin liquids (clears) Temperature Spikes Noted: No Respiratory Status: Room air (pt not leaving oxygen in place per nurse tech, o2 sats adequate per nurse tech) History of Recent Intubation: No Behavior/Cognition: Alert;Cooperative;Decreased sustained attention;Requires cueing (baseline dementia) Oral Cavity - Dentition: Adequate natural dentition Self-Feeding Abilities: Able to feed self;Needs set up Patient Positioning: Upright in bed Baseline Vocal Quality: Clear Volitional Cough: Cognitively unable to elicit Volitional Swallow: Unable to elicit    Oral/Motor/Sensory Function Overall Oral Motor/Sensory Function:  (pt did not consistently follow directions but no focal cn deficits apparent, ? miminal facial asymmetry)   Ice Chips Ice chips: Not tested   Thin Liquid  Thin Liquid: Within functional limits Presentation: Self Fed;Straw    Nectar Thick Nectar Thick Liquid: Not tested   Honey Thick Honey Thick Liquid: Not tested   Puree Puree: Within  functional limits Presentation: Self Fed;Spoon   Solid   GO    Solid: Within functional limits Presentation: [place], [facility] SLP [phone]

## 2014-10-10 NOTE — Clinical Social Work Placement (Addendum)
Clinical Social Work Department CLINICAL SOCIAL WORK PLACEMENT NOTE 10/10/2014  Patient:  Christopher Summers, Christopher Summers  Account Number:  [id number] Admit date:  10/08/2014  Clinical Social Worker:  Paulette Blanch Leul Narramore, LCSWA  Date/time:  10/10/2014 6:00 PM  Clinical Social Work is seeking post-discharge placement for this patient at the following level of care:   SKILLED NURSING   (*CSW will update this form in Epic as items are completed)   10/10/2014  Patient/family provided with [place] Department of Clinical Social Work's list of facilities offering this level of care within the geographic area requested by the patient (or if unable, by the patient's family).  10/10/2014  Patient/family informed of their freedom to choose among providers that offer the needed level of care, that participate in Medicare, Medicaid or managed care program needed by the patient, have an available bed and are willing to accept the patient.  10/10/2014  Patient/family informed of [facility]' ownership interest in [facility], as well as of the fact that they are under no obligation to receive care at this facility.  PASARR submitted to EDS on 10/10/2014 PASARR number received on 10/10/2014  FL2 transmitted to all facilities in geographic area requested by pt/family on  10/10/2014 FL2 transmitted to all facilities within larger geographic area on 10/10/2014  Patient informed that his/her managed care company has contracts with or will negotiate with  certain facilities, including the following:     Patient/family informed of bed offers received:  10/11/2014 Patient chooses bed at [facility]  Physician recommends and patient chooses bed at    Patient to be transferred to [facility] on 10/11/2014   Patient to be transferred to facility by [facility] Patient and family notified of transfer on 10/11/2014 Name of family member notified:  [name]   The following physician request were entered in  Epic:   Additional Comments:  [place], [name], [place]

## 2014-10-10 NOTE — Progress Notes (Signed)
Occupational Therapy Evaluation Patient Details Name: CLEARANCE CHENAULT MRN: [id number] DOB: Nov 07, 1941 Today's Date: 10/10/2014    History of Present Illness Ladarius Seubert is a 73 y.o. male s/p Right arthroplasty bipolar hip on 10/09/14. PMH of advanced dementia who fell from a ladder at home while blowing leaves from his roof.    Clinical Impression   PTA pt lived at home with his wife and was independent with ADLs. Pt currently requires total (A) +2 for LB ADLs and functional transfers and will benefit from SNF at d/c. Pt will continue to benefit from acute OT to address functional transfers and bed mobility.     Follow Up Recommendations  SNF;Supervision/Assistance - 24 hour    Equipment Recommendations  Other (comment) (defer to SNF)    Recommendations for Other Services       Precautions / Restrictions Precautions Precautions: Posterior Hip Precaution Booklet Issued: Yes (comment) Required Braces or Orthoses: Knee Immobilizer - Right Knee Immobilizer - Right: On at all times (to assist with hip precautions) Restrictions Weight Bearing Restrictions: No RLE Weight Bearing: Weight bearing as tolerated      Mobility Bed Mobility Overal bed mobility: Needs Assistance;+2 for physical assistance Bed Mobility: Supine to Sit     Supine to sit: +2 for physical assistance;HOB elevated;Max assist     General bed mobility comments: Max (A) due to pain and pt actively pushing against therapists. Pt required assist to move RLE to EOB and (A) for trunkal elevation. Pt avoiding hip flexion due to pain.   Transfers Overall transfer level: Needs assistance Equipment used:  (gait belt assisted face to face transfer) Transfers: Squat Pivot Transfers     Squat pivot transfers: Total assist;+2 physical assistance;From elevated surface     General transfer comment: Pt did not achieve full standing and required +2 assistance to bring hips off bed and to pivot to recliner.  Transferred to pt's Left side.          ADL Overall ADL's : Needs assistance/impaired Eating/Feeding: Set up;Sitting   Grooming: Set up;Sitting   Upper Body Bathing: Set up;Sitting   Lower Body Bathing: Total assistance;+2 for physical assistance;Sit to/from stand   Upper Body Dressing : Set up;Sitting   Lower Body Dressing: Total assistance;+2 for physical assistance;Sit to/from stand   Toilet Transfer: Total assistance;+2 for physical assistance;Squat-pivot (from bed>recliner with gait belt assisted face to face trans)             General ADL Comments: Pt limited by cognitive level and increased pain, however was able to perform transfer to recliner with +2 assist.     Vision  No apparent deficits.                    Perception Perception Perception Tested?: No   Praxis Praxis Praxis tested?: Not tested    Pertinent Vitals/Pain Pain Assessment: Faces Faces Pain Scale: Hurts whole lot Pain Location: Right leg (also indicated left leg pain) Pain Intervention(s): Limited activity within patient's tolerance;Monitored during session;Repositioned;Premedicated before session     Hand Dominance     Extremity/Trunk Assessment Upper Extremity Assessment Upper Extremity Assessment: Overall WFL for tasks assessed   Lower Extremity Assessment Lower Extremity Assessment: Defer to PT evaluation   Cervical / Trunk Assessment Cervical / Trunk Assessment: Normal   Communication Communication Communication: No difficulties   Cognition Arousal/Alertness: Awake/alert Behavior During Therapy: Restless Overall Cognitive Status: History of cognitive impairments - at baseline       Memory:  Decreased recall of precautions;Decreased short-term memory                        Home Living Family/patient expects to be discharged to:: Skilled nursing facility Living Arrangements: Spouse/significant other                                       Prior Functioning/Environment Level of Independence: Independent             OT Diagnosis: Generalized weakness;Acute pain;Cognitive deficits   OT Problem List: Decreased strength;Decreased range of motion;Decreased activity tolerance;Impaired balance (sitting and/or standing);Decreased safety awareness;Decreased knowledge of use of DME or AE;Decreased knowledge of precautions;Pain   OT Treatment/Interventions: Self-care/ADL training;DME and/or AE instruction;Therapeutic activities;Patient/family education    OT Goals(Current goals can be found in the care plan section) Acute Rehab OT Goals Patient Stated Goal: not stated OT Goal Formulation: With patient/family Time For Goal Achievement: 10/24/14 Potential to Achieve Goals: Good ADL Goals Pt Will Transfer to Toilet: with min assist;stand pivot transfer;bedside commode Additional ADL Goal #1: Pt will perform bed mobility with min (A) to prepare for ADLs at EOB.  OT Frequency: Min 1X/week   Barriers to D/C: Other (comment)  Pt requires higher level of care than wife can provide.          End of Session Equipment Utilized During Treatment: Gait belt;Right knee immobilizer Nurse Communication: Mobility status;Need for lift equipment  Activity Tolerance: Patient limited by pain Patient left: in chair;with call bell/phone within reach;with chair alarm set;with family/visitor present   Time: 7989-2119 OT Time Calculation (min): 30 min Charges:  OT General Charges $OT Visit: 1 Procedure OT Evaluation $Initial OT Evaluation Tier I: 1 Procedure  Juluis Rainier 10/10/2014, 10:48 AM  Cyndie Chime, OTR/L Occupational Therapist [phone] (pager)

## 2014-10-10 NOTE — Progress Notes (Signed)
FOLLOW-UP NUTRITION ASSESSMENT  INTERVENTION: Encourage PO intake RD to continue to monitor PO intake for adequacy  NUTRITION DIAGNOSIS: Predicted sub optimal energy intake related to surgery and dementia as evidenced by patient's chart; ongoing   Goal: Pt to meet >/= 90% of their estimated nutrition needs   Monitor:  Diet advancement, PO intake, weight trend, labs  Reason for Assessment: Consult for Assessment  73 y.o. male  Admitting Dx: Hip fracture, right  ASSESSMENT: 73 y.o. male with a past medical history of Alzheimer's dementia who was in his usual state of health until earlier today when he was climbing a ladder to blow leaves off of his roof. Subsequently he fell off the ladder after losing balance. There was no loss of consciousness. Patient has advanced Alzheimers dementia and doesn't recall the event.   Pt asleep at time of visit. RD spoke with patient's wife at bedside who reports pt normally has a good appetite and eats well. She reports he has been maintaining his weight. Pt's diet was advanced to clear liquids this morning and he consumed 100% of breakfast. Now on a regular diet and he ate 100% of lunch per nursing notes.   Labs reviewed.   Height: Ht Readings from Last 1 Encounters:  10/08/14 5\' 11"  (1.803 m)    Weight: Wt Readings from Last 1 Encounters:  10/08/14 153 lb 3.5 oz (69.5 kg)    BMI:  Body mass index is 21.38 kg/(m^2).  Estimated Nutritional Needs: Kcal: 5784-6962 Protein: 85-95 grams Fluid: 1.7-1.9 L/day  Skin: incision on right hip  Diet Order: General   Intake/Output Summary (Last 24 hours) at 10/10/14 1309 Last data filed at 10/10/14 1259  Gross per 24 hour  Intake   1840 ml  Output   1200 ml  Net    640 ml    Last BM: 10/27  Labs:   Recent Labs Lab 10/08/14 1504 10/09/14 0516 10/10/14 0712  NA 142 139 139  K 3.8 3.7 3.8  CL 103 103 102  CO2  --  21 24  BUN 10 9 9   CREATININE 1.00 0.93 1.08  CALCIUM  --  9.1  8.6  GLUCOSE 108* 120* 130*    CBG (last 3)  No results found for this basename: GLUCAP,  in the last 72 hours  Scheduled Meds: . divalproex  250 mg Oral QPC lunch  . docusate sodium  100 mg Oral BID  . donepezil  10 mg Oral QHS  . enoxaparin (LOVENOX) injection  40 mg Subcutaneous Q24H  . memantine  10 mg Oral Daily  . sertraline  50 mg Oral Daily    Continuous Infusions:   Pryor Ochoa RD, LDN Inpatient Clinical Dietitian Pager: [phone] After Hours Pager: [phone]

## 2014-10-10 NOTE — Clinical Social Work Psychosocial (Signed)
Clinical Social Work Department BRIEF PSYCHOSOCIAL ASSESSMENT 10/10/2014  Patient:  Christopher Summers, Christopher Summers     Account Number:  [id number]     Admit date:  10/08/2014  Clinical Social Worker:  Marciano Sequin  Date/Time:  10/10/2014 12:01 PM  Referred by:  RN  Date Referred:  10/10/2014 Referred for  SNF Placement   Other Referral:   Interview type:  Family Other interview type:   Pt is oriented to self; wife Christopher Summers, Christopher Summers [phone]  & [phone]    PSYCHOSOCIAL DATA Living Status:  WIFE Admitted from facility:   Level of care:   Primary support name:  Christopher Summers,Christopher Summers Primary support relationship to patient:  SPOUSE Degree of support available:   Strong Support System    CURRENT CONCERNS  Other Concerns:    SOCIAL WORK ASSESSMENT / PLAN CSW met [name] at bedside. CSW introduced self and purpose of visit. CSW and Christopher Summers discussed the clinical team recommendations for rehab. CSW explained the SNF process to the [facility]. CSW and Christopher Summers reviewed the SNF list. CSW answered all Christopher Summers's questions regarding SNF rehab. CSW provided the [facility] with contact information for further questions. CSW will continue to follow this pt and assist with discharge as needed.   Assessment/plan status:  Psychosocial Support/Ongoing Assessment of Needs Other assessment/ plan:   Information/referral to community resources:    PATIENT'S/FAMILY'S RESPONSE TO PLAN OF CARE: Pt wife presented with and calm mood and a normal affect. Pt's wife provided great examples to why the pt needs to trantition for rehab. Pt's wife was open to reviewing SNF list.    Christopher Summers, [name], [place]

## 2014-10-10 NOTE — Progress Notes (Signed)
[facility] Progress Note  Subjective  Doing ok Sitting up in bed, drinking Wife at bedside as well  Review of Systems  Constitutional: Negative for fever and chills.  Respiratory: Negative for shortness of breath.   Cardiovascular: Negative for chest pain and palpitations.  Gastrointestinal: Negative for nausea, vomiting and abdominal pain.  Neurological: Negative for tingling and sensory change.     Objective   BP 156/78  Pulse 93  Temp(Src) 98.2 F (36.8 C) (Oral)  Resp 16  Ht 5\' 11"  (1.803 m)  Wt 69.5 kg (153 lb 3.5 oz)  BMI 21.38 kg/m2  SpO2 100%  Intake/Output     10/27 0701 - 10/28 0700 10/28 0701 - 10/29 0700   P.O.  360   I.V. (mL/kg) 1000 (14.4)    Total Intake(mL/kg) 1000 (14.4) 360 (5.2)   Urine (mL/kg/hr) 950 (0.6)    Blood 400 (0.2)    Total Output 1350     Net -350 +360        Urine Occurrence 1 x      Labs  Results for Blouch, ZACHAREE GADDIE (MRN [id number]) as of 10/10/2014 08:31  Ref. Range 10/10/2014 07:12  Sodium Latest Range: 137-147 mEq/L 139  Potassium Latest Range: 3.7-5.3 mEq/L 3.8  Chloride Latest Range: 96-112 mEq/L 102  CO2 Latest Range: 19-32 mEq/L 24  BUN Latest Range: 6-23 mg/dL 9  Creatinine Latest Range: 0.50-1.35 mg/dL 1.08  Calcium Latest Range: 8.4-10.5 mg/dL 8.6  GFR calc non Af Amer Latest Range: >90 mL/min 67 (L)  GFR calc Af Amer Latest Range: >90 mL/min 78 (L)  Glucose Latest Range: 70-99 mg/dL 130 (H)  Anion gap Latest Range: 5-15  13  WBC Latest Range: 4.0-10.5 K/uL 11.1 (H)  RBC Latest Range: 4.22-5.81 MIL/uL 3.73 (L)  Hemoglobin Latest Range: 13.0-17.0 g/dL 11.1 (L)  HCT Latest Range: 39.0-52.0 % 33.8 (L)  MCV Latest Range: 78.0-100.0 fL 90.6  MCH Latest Range: 26.0-34.0 pg 29.8  MCHC Latest Range: 30.0-36.0 g/dL 32.8  RDW Latest Range: 11.5-15.5 % 13.1  Platelets Latest Range: 150-400 K/uL 206   Exam  Gen: awake, sitting up in bed, NAD Lungs: clear Cardiac: RRR Abd: soft, NTND, + BS Ext:        Right Lower Extremity   Dressing c/d/i  Knee immobilizer fitting well  Swelling stable  Ext warm   + DP pulse  No DCT  Compartments soft and NT  Distal motor and sensory functions intact   Assessment and Plan   POD/HD#: 1  73 y/o male w/ h/o dementia s/p fall off ladder with R femoral neck fracture  1. Fall  2. R femoral neck fracture s/p R hip hemiarthroplasty  WBAT  Posterior hip precautions  PT/OT evals  Ice prn  Continue with knee immobilizer for now to maintain hip precautions  Dressing changes starting tomorrow if needed    3. Pain management:  Low dose narcotics as needed  Tylenol   4. ABL anemia/Hemodynamics  Stable  Continue to monitor  5. Medical issues   Per medical service  6. DVT/PE prophylaxis:  Lovenox x 14 days post op   7. ID:   Completed periop abx  8. Activity:  WBAT R leg  Up with assist  PT/OT consults  9. FEN/Foley/Lines:  SLP eval due to dementia    10. Dispo:  PT/OT evals  SW consult for SNF     Jari Pigg, PA-C [facility] [phone] (O) 10/10/2014 8:30 AM

## 2014-10-10 NOTE — Progress Notes (Signed)
10/10/14 1500  Urine Characteristics  Bladder Scan Volume (mL) 250 mL  Intermittent/Straight Cath (mL) 550 mL    Patient unable to empty bladder. Please see above. Encourage oral fluids. IVF continue.  He is due to void again at 2100. Will continue to monitor.  Ave Filter, RN

## 2014-10-10 NOTE — Progress Notes (Signed)
Patient ID: Christopher Summers  male  FBP:[id number]    DOB: October 04, 1941    DOA: 10/08/2014  PCP: Unice Cobble, MD  HPI: Christopher Summers is a 73 y.o. male with a past medical history of Alzheimer's dementia who was in his usual state of health until earlier today when he was climbing a ladder to blow leaves off of his roof. Subsequently he fell off the ladder after losing balance. The patient was complaining of pain in his right hip and leg. X-rays showed nondisplaced right femoral neck fracture. Orthopedics consulted  Assessment/Plan: Principal Problem:   Hip fracture, right -Patient was taken to the OR on 10/09/2014 where he underwent right hip hemiarthroplasty, tolerated procedure well there are no immediate complications -Patient on Vicodin and as needed IV morphine for severe breakthrough pain -Physical therapy and occupational therapy have been consulted, recommending SNF placement.  Active Problems:   Alzheimer's dementia - Mental status at baseline per wife - Continue Aricept, Depakote, Namenda, Zoloft    DVT Prophylaxis:  Code Status:  Family Communication: I spoke with his wife was present at bedside  Disposition:  Consultants:  Orthopedics  Procedures:  None  Antibiotics:  Ancef preop   Subjective: Patient seen and examined he is awake, alert, confused disoriented. Complains of right hip pain  Objective: Weight change:   Intake/Output Summary (Last 24 hours) at 10/10/14 1611 Last data filed at 10/10/14 1500  Gross per 24 hour  Intake   1840 ml  Output   1750 ml  Net     90 ml   Blood pressure 147/61, pulse 96, temperature 99.2 F (37.3 C), temperature source Oral, resp. rate 18, height 5\' 11"  (1.803 m), weight 69.5 kg (153 lb 3.5 oz), SpO2 96.00%.  Physical Exam: General: Resting, NAD, comfortable CVS: S1-S2 clear, no murmur rubs or gallops Chest: clear to auscultation bilaterally, no wheezing, rales or rhonchi Abdomen: soft nontender,  nondistended, normal bowel sounds  Extremities: no cyanosis, clubbing or edema noted bilaterally   Lab Results: Basic Metabolic Panel:  Recent Labs Lab 10/09/14 0516 10/10/14 0712  NA 139 139  K 3.7 3.8  CL 103 102  CO2 21 24  GLUCOSE 120* 130*  BUN 9 9  CREATININE 0.93 1.08  CALCIUM 9.1 8.6   Liver Function Tests: No results found for this basename: AST, ALT, ALKPHOS, BILITOT, PROT, ALBUMIN,  in the last 168 hours No results found for this basename: LIPASE, AMYLASE,  in the last 168 hours No results found for this basename: AMMONIA,  in the last 168 hours CBC:  Recent Labs Lab 10/08/14 1455  10/09/14 0516 10/10/14 0712  WBC 9.5  --  9.8 11.1*  NEUTROABS 7.8*  --   --   --   HGB 13.9  < > 11.8* 11.1*  HCT 42.9  < > 36.3* 33.8*  MCV 91.7  --  90.5 90.6  PLT 228  --  228 206  < > = values in this interval not displayed. Cardiac Enzymes: No results found for this basename: CKTOTAL, CKMB, CKMBINDEX, TROPONINI,  in the last 168 hours BNP: No components found with this basename: POCBNP,  CBG: No results found for this basename: GLUCAP,  in the last 168 hours   Micro Results: Recent Results (from the past 240 hour(s))  SURGICAL PCR SCREEN     Status: None   Collection Time    10/09/14  8:07 AM      Result Value Ref Range Status   MRSA,  PCR NEGATIVE  NEGATIVE Final   Staphylococcus aureus NEGATIVE  NEGATIVE Final   Comment:            The Xpert SA Assay (FDA     approved for NASAL specimens     in patients over 76 years of age),     is one component of     a comprehensive surveillance     program.  Test performance has     been validated by [employer] for patients greater     than or equal to 29 year old.     It is not intended     to diagnose infection nor to     guide or monitor treatment.    Studies/Results: Dg Chest 1 View  10/08/2014   CLINICAL DATA:  [place] off a ladder.  Mental status change.  EXAM: CHEST - 1 VIEW  COMPARISON:  05/18/2010   FINDINGS: The cardiac silhouette, mediastinal and hilar contours are within normal limits. There is tortuosity and calcification of the thoracic aorta. The lungs are clear. No pleural effusion. The bony thorax is intact.  IMPRESSION: No acute cardiopulmonary findings and intact bony thorax.   Electronically Signed   By: Kalman Jewels M.D.   On: 10/08/2014 14:41   Dg Hip Complete Right  10/08/2014   CLINICAL DATA:  [place] off ladder today.  Right hip pain  EXAM: RIGHT HIP - COMPLETE 2+ VIEW  COMPARISON:  None.  FINDINGS: Right femoral neck fracture. No significant displacement. Right hip joint is normal.  IMPRESSION: Nondisplaced fracture right femoral neck.   Electronically Signed   By: Franchot Gallo M.D.   On: 10/08/2014 14:35   Dg Femur Right  10/08/2014   CLINICAL DATA:  [place] off ladder today  EXAM: RIGHT FEMUR - 2 VIEW  COMPARISON:  Right hip from today  FINDINGS: Right femoral neck fracture. No other fracture of the femur. The knee joint appears normal.  IMPRESSION: Femoral neck fracture.   Electronically Signed   By: Franchot Gallo M.D.   On: 10/08/2014 14:36   Ct Head Wo Contrast  10/08/2014   CLINICAL DATA:  Unwitnessed fall.  Patient found on ground.  EXAM: CT HEAD WITHOUT CONTRAST  CT CERVICAL SPINE WITHOUT CONTRAST  TECHNIQUE: Multidetector CT imaging of the head and cervical spine was performed following the standard protocol without intravenous contrast. Multiplanar CT image reconstructions of the cervical spine were also generated.  COMPARISON:  05/18/2010 -CT C spine  FINDINGS: CT HEAD FINDINGS  There is no evidence of mass effect, midline shift, or extra-axial fluid collections. There is no evidence of a space-occupying lesion or intracranial hemorrhage. There is no evidence of a cortical-based area of acute infarction. There is generalized cerebral atrophy. There is periventricular white matter low attenuation likely secondary to microangiopathy.  The ventricles and sulci are  appropriate for the patient's age. The basal cisterns are patent.  Visualized portions of the orbits are unremarkable. There is mild right sphenoid sinus mucosal thickening. Cerebrovascular atherosclerotic calcifications are noted.  The osseous structures are unremarkable.  CT CERVICAL SPINE FINDINGS  The alignment is anatomic. The vertebral body heights are maintained. Incidental note is made of incomplete fusion of the posterior arch of C1. There is no acute fracture. There is no static listhesis. The prevertebral soft tissues are normal. The intraspinal soft tissues are not fully imaged on this examination due to poor soft tissue contrast, but there is no gross soft tissue abnormality.  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 There is degenerative disc disease at C3-4, C4-5 C6-7. Bilateral uncovertebral degenerative changes at C3-4. Mild broad-based disc bulge at C4-5. Mild broad-based disc osteophyte complex at C5-6 with bilateral uncovertebral degenerative changes resulting in mild left foraminal encroachment. Mild broad-based disc bulge at C6-7 with bilateral uncovertebral degenerative changes.  The visualized portions of the lung apices demonstrate no focal abnormality.  IMPRESSION: 1. No acute intracranial pathology. 2. No acute osseous injury of the cervical spine.   Electronically Signed   By: Kathreen Devoid   On: 10/08/2014 14:49   Ct Cervical Spine Wo Contrast  10/08/2014   CLINICAL DATA:  Unwitnessed fall.  Patient found on ground.  EXAM: CT HEAD WITHOUT CONTRAST  CT CERVICAL SPINE WITHOUT CONTRAST  TECHNIQUE: Multidetector CT imaging of the head and cervical spine was performed following the standard protocol without intravenous contrast. Multiplanar CT image reconstructions of the cervical spine were also generated.  COMPARISON:  05/18/2010 -CT C spine  FINDINGS: CT HEAD FINDINGS  There is no evidence of mass effect, midline shift, or extra-axial fluid collections. There is no evidence  of a space-occupying lesion or intracranial hemorrhage. There is no evidence of a cortical-based area of acute infarction. There is generalized cerebral atrophy. There is periventricular white matter low attenuation likely secondary to microangiopathy.  The ventricles and sulci are appropriate for the patient's age. The basal cisterns are patent.  Visualized portions of the orbits are unremarkable. There is mild right sphenoid sinus mucosal thickening. Cerebrovascular atherosclerotic calcifications are noted.  The osseous structures are unremarkable.  CT CERVICAL SPINE FINDINGS  The alignment is anatomic. The vertebral body heights are maintained. Incidental note is made of incomplete fusion of the posterior arch of C1. There is no acute fracture. There is no static listhesis. The prevertebral soft tissues are normal. The intraspinal soft tissues are not fully imaged on this examination due to poor soft tissue contrast, but there is no gross soft tissue abnormality.  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 There is degenerative disc disease at C3-4, C4-5 C6-7. Bilateral uncovertebral degenerative changes at C3-4. Mild broad-based disc bulge at C4-5. Mild broad-based disc osteophyte complex at C5-6 with bilateral uncovertebral degenerative changes resulting in mild left foraminal encroachment. Mild broad-based disc bulge at C6-7 with bilateral uncovertebral degenerative changes.  The visualized portions of the lung apices demonstrate no focal abnormality.  IMPRESSION: 1. No acute intracranial pathology. 2. No acute osseous injury of the cervical spine.   Electronically Signed   By: Kathreen Devoid   On: 10/08/2014 14:49    Medications: Scheduled Meds: . divalproex  250 mg Oral QPC lunch  . docusate sodium  100 mg Oral BID  . donepezil  10 mg Oral QHS  . enoxaparin (LOVENOX) injection  40 mg Subcutaneous Q24H  . memantine  10 mg Oral Daily  . sertraline  50 mg Oral Daily      LOS: 2 days    Kelvin Cellar M.D. [facility] 10/10/2014, 4:11 PM Pager: [phone]  If 7PM-7AM, please contact night-coverage [email or web address] Password [email or web address]

## 2014-10-11 ENCOUNTER — Encounter: Payer: Self-pay | Admitting: Internal Medicine

## 2014-10-11 ENCOUNTER — Non-Acute Institutional Stay (SKILLED_NURSING_FACILITY): Payer: Medicare Other | Admitting: Internal Medicine

## 2014-10-11 DIAGNOSIS — G309 Alzheimer's disease, unspecified: Secondary | ICD-10-CM

## 2014-10-11 DIAGNOSIS — D519 Vitamin B12 deficiency anemia, unspecified: Secondary | ICD-10-CM

## 2014-10-11 DIAGNOSIS — S72001D Fracture of unspecified part of neck of right femur, subsequent encounter for closed fracture with routine healing: Secondary | ICD-10-CM

## 2014-10-11 DIAGNOSIS — D62 Acute posthemorrhagic anemia: Secondary | ICD-10-CM

## 2014-10-11 DIAGNOSIS — I10 Essential (primary) hypertension: Secondary | ICD-10-CM

## 2014-10-11 DIAGNOSIS — G308 Other Alzheimer's disease: Secondary | ICD-10-CM

## 2014-10-11 DIAGNOSIS — F0281 Dementia in other diseases classified elsewhere with behavioral disturbance: Secondary | ICD-10-CM

## 2014-10-11 LAB — BASIC METABOLIC PANEL
ANION GAP: 12 (ref 5–15)
BUN: 10 mg/dL (ref 6–23)
CALCIUM: 8.6 mg/dL (ref 8.4–10.5)
CO2: 26 mEq/L (ref 19–32)
Chloride: 101 mEq/L (ref 96–112)
Creatinine, Ser: 0.94 mg/dL (ref 0.50–1.35)
GFR calc Af Amer: >90 mL/min (ref >90)
GFR calc non Af Amer: 82 mL/min — ABNORMAL LOW (ref >90)
GLUCOSE: 113 mg/dL — AB (ref 70–99)
Potassium: 3.6 mEq/L — ABNORMAL LOW (ref 3.7–5.3)
SODIUM: 139 meq/L (ref 137–147)

## 2014-10-11 LAB — CBC
HCT: 30.1 % — ABNORMAL LOW (ref 39.0–52.0)
Hemoglobin: 9.6 g/dL — ABNORMAL LOW (ref 13.0–17.0)
MCH: 29.3 pg (ref 26.0–34.0)
MCHC: 31.9 g/dL (ref 30.0–36.0)
MCV: 91.8 fL (ref 78.0–100.0)
PLATELETS: 182 10*3/uL (ref 150–400)
RBC: 3.28 MIL/uL — ABNORMAL LOW (ref 4.22–5.81)
RDW: 13.1 % (ref 11.5–15.5)
WBC: 10.7 10*3/uL — ABNORMAL HIGH (ref 4.0–10.5)

## 2014-10-11 MED ORDER — HYDROCODONE-ACETAMINOPHEN 5-325 MG PO TABS: 1.0000 | ORAL_TABLET | Freq: Four times a day (QID) | ORAL | Status: DC | PRN

## 2014-10-11 MED ORDER — ENOXAPARIN SODIUM 40 MG/0.4ML ~~LOC~~ SOLN: 40.0000 mg | SUBCUTANEOUS | Status: DC

## 2014-10-11 MED ORDER — DSS 100 MG PO CAPS: 100.0000 mg | ORAL_CAPSULE | Freq: Two times a day (BID) | ORAL | Status: DC

## 2014-10-11 MED ORDER — BISACODYL 10 MG RE SUPP: 10.0000 mg | Freq: Every day | RECTAL | Status: DC | PRN

## 2014-10-11 NOTE — Progress Notes (Signed)
MRN: [id number] Name: Christopher Summers  Sex: male Age: 73 y.o. DOB: July 22, 1941  [place] #: Helene Kelp  Facility/Room:115 Level Of Care: SNF Provider: Inocencio Homes D Emergency Contacts: Extended Emergency Contact Information Primary Emergency Contact: Aslinger,Betty J Address: [address]          [place], [place] [address] [place] Phone: [phone] Mobile Phone: [phone] Relation: Spouse    Allergies: Niacin and related and Terazosin  Chief Complaint  Patient presents with  . New Admit To SNF    HPI: Patient is 73 y.o. male who is admitted to SNF for OT/PT after R total hp 2/2 fx.  Past Medical History  Diagnosis Date  . BPH (benign prostatic hypertrophy)   . Depression   . Essential hypertension, benign   . Reflux   . Dementia     Past Surgical History  Procedure Laterality Date  . Transurethral resection of prostate    . Hip arthroplasty Right 10/09/2014    Procedure: Right Hemiarthroplasty;  Surgeon: Rozanna Box, MD;  Location: [facility];  Service: Orthopedics;  Laterality: Right;      Medication List       This list is accurate as of: 10/11/14 11:59 PM.  Always use your most recent med list.               bisacodyl 10 MG suppository  Commonly known as:  DULCOLAX  Place 1 suppository (10 mg total) rectally daily as needed for moderate constipation.     divalproex 250 MG 24 hr tablet  Commonly known as:  DEPAKOTE ER  Take 250 mg by mouth daily after lunch.     donepezil 10 MG tablet  Commonly known as:  ARICEPT  Take 10 mg by mouth at bedtime.     DSS 100 MG Caps  Take 100 mg by mouth 2 (two) times daily.     enoxaparin 40 MG/0.4ML injection  Commonly known as:  LOVENOX  Inject 0.4 mLs (40 mg total) into the skin daily.     HYDROcodone-acetaminophen 5-325 MG per tablet  Commonly known as:  NORCO/VICODIN  Take 1-2 tablets by mouth every 6 (six) hours as needed for moderate pain.     memantine 10 MG tablet  Commonly  known as:  NAMENDA  Take 10 mg by mouth daily.     sertraline 50 MG tablet  Commonly known as:  ZOLOFT  Take 50 mg by mouth daily.     vitamin B-12 100 MCG tablet  Commonly known as:  CYANOCOBALAMIN  Take 100 mcg by mouth daily as needed (for vitamin).        No orders of the defined types were placed in this encounter.     There is no immunization history on file for this patient.  History  Substance Use Topics  . Smoking status: Current Every Day Smoker -- 0.50 packs/day    Types: Cigarettes  . Smokeless tobacco: Not on file  . Alcohol Use: No    Family history is noncontributory    Review of Systems  DATA OBTAINED: from patient GENERAL:  no fevers, fatigue, appetite changes; denies all SKIN: No itching, rash or wounds EYES: No eye pain, redness, discharge EARS: No earache, tinnitus, change in hearing NOSE: No congestion, drainage or bleeding  MOUTH/THROAT: No mouth or tooth pain, No sore throat RESPIRATORY: No cough, wheezing, SOB CARDIAC: No chest pain, palpitations, lower extremity edema  GI: No abdominal pain, No N/V/D or constipation, No heartburn or  reflux  GU: No dysuria, frequency or urgency, or incontinence  MUSCULOSKELETAL: No unrelieved bone/joint pain NEUROLOGIC: No headache, dizziness or focal weakness PSYCHIATRIC: No overt anxiety or sadness, No behavior issue.   Filed Vitals:   10/11/14 2047  BP: 145/80  Pulse: 90  Temp: 97.8 F (36.6 C)  Resp: 20    Physical Exam  GENERAL APPEARANCE: Alert, minconversant,  No acute distress.  SKIN: No diaphoresis rash HEAD: Normocephalic, atraumatic  EYES: Conjunctiva/lids clear. Pupils round, reactive. EOMs intact.  EARS: External exam WNL, canals clear. Hearing grossly normal.  NOSE: No deformity or discharge.  MOUTH/THROAT: Lips w/o lesions  RESPIRATORY: Breathing is even, unlabored. Lung sounds are clear   CARDIOVASCULAR: Heart RRR no murmurs, rubs or gallops. No peripheral edema.    GASTROINTESTINAL: Abdomen is soft, non-tender, not distended w/ normal bowel sounds. GENITOURINARY: Bladder non tender, not distended  MUSCULOSKELETAL: No abnormal joints or musculature NEUROLOGIC:  Cranial nerves 2-12 grossly intact PSYCHIATRIC: Mood and affect appropriate to situation, no behavioral issues  Patient Active Problem List   Diagnosis Date Noted  . 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 Acute blood loss anemia 10/15/2014  . Hip fracture, right 10/08/2014  . Alzheimer's dementia with behavioral disturbance 10/08/2014  . Preoperative evaluation 10/08/2014  . DEPRESSIVE DISORDER, MAJOR RCR, UNSPECIFIED 10/05/2007  . HYPERTENSION, BENIGN 10/05/2007  . CYSTITIS, ACUTE 10/05/2007  . BENIGN PROSTATIC HYPERTROPHY, HX OF, S/P TURP 10/05/2007    CBC    Component Value Date/Time   WBC 10.7* 10/11/2014 0558   RBC 3.28* 10/11/2014 0558   HGB 9.6* 10/11/2014 0558   HCT 30.1* 10/11/2014 0558   PLT 182 10/11/2014 0558   MCV 91.8 10/11/2014 0558   LYMPHSABS 1.0 10/08/2014 1455   MONOABS 0.6 10/08/2014 1455   EOSABS 0.0 10/08/2014 1455   BASOSABS 0.0 10/08/2014 1455    CMP     Component Value Date/Time   NA 139 10/11/2014 0558   K 3.6* 10/11/2014 0558   CL 101 10/11/2014 0558   CO2 26 10/11/2014 0558   GLUCOSE 113* 10/11/2014 0558   BUN 10 10/11/2014 0558   CREATININE 0.94 10/11/2014 0558   CALCIUM 8.6 10/11/2014 0558   PROT 7.9 09/24/2007 1115   ALBUMIN 3.7 09/24/2007 1115   AST 21 09/24/2007 1115   ALT 21 09/24/2007 1115   ALKPHOS 71 09/24/2007 1115   BILITOT 1.5* 09/24/2007 1115   GFRNONAA 82* 10/11/2014 0558   GFRAA >90 10/11/2014 0558    Assessment and Plan  Hip fracture, right S/p total hip;lovenox for 10 days;OT/PT  Alzheimer's dementia with behavioral disturbance Continue aricept, namenda, zoloft and depakote  HYPERTENSION, BENIGN Moderatel elevated, on no meds;will monitor  Anemia B twelve deficiency Post op Hb 9.6-continue daily  B12  Acute blood loss anemia Post op Hb 9.6-continue daily B12; no tx required    Hennie Duos, MD

## 2014-10-11 NOTE — Progress Notes (Signed)
Report given to [facility] Nurse.Awaiting for transportation. Family at bedside. All belongings sent with patient's wife.   Ave Filter, RN

## 2014-10-11 NOTE — Discharge Summary (Signed)
Physician Discharge Summary  Christopher Summers OVF:[id number] DOB: Sep 09, 1941 DOA: 10/08/2014  PCP: Unice Cobble, MD  Admit date: 10/08/2014 Discharge date: 10/11/2014  Time spent: 35 minutes  Recommendations for Outpatient Follow-up:  1. Please follow up on CBC and BMP in 2-3 days  Discharge Diagnoses:  Principal Problem:   Hip fracture, right Active Problems:   Alzheimer's dementia   Preoperative evaluation   Discharge Condition: Stable  Diet recommendation: Heart Healthy  Filed Weights   10/08/14 2012  Weight: 69.5 kg (153 lb 3.5 oz)    History of present illness:  Christopher Summers is a 73 y.o. male with a past medical history of Alzheimer's dementia who was in his usual state of health until earlier today when he was climbing a ladder to blow leaves off of his roof. Subsequently he fell off the ladder after losing balance. There was no loss of consciousness. Patient was complaining of pain in his right hip and leg. Patient has advanced Alzheimers dementia and doesn't recall the event. He denies any pain at this time. History is not available from the patient. His wife provided most of the information. Patient's wife denies any recent illness or sickness. No recent fever or chills. No nausea, vomiting. No recent changes to his medications.   Hospital Course:  Patient is a pleasant 73 year old gent with a past medical history of dementia, who had been in his usual state health, climbed a ladder to blow leaves off of his roof subsequently having a fall. He was unable to bear weight due to severe pain, brought to the emergency department where films revealed a right femoral neck fracture. Orthopedic surgery was consulted, as he was taken to the OR on 10/09/2014 undergoing right hemiarthroplasty. Patient tolerated procedure well there are no immediate complications. Physical therapy was consulted. He remained stable during this hospitalization. He was discharged to skilled nursing  facility for rehabilitation on 10/11/2014.  Procedures:  Right hemiarthroplasty performed on 10/09/2014  Consultations:  Orthopedic surgery  Discharge Exam: Filed Vitals:   10/11/14 0922  BP: 136/68  Pulse: 82  Temp: 99.1 F (37.3 C)  Resp: 20    General: Patient is confused, disoriented, however no acute distress Cardiovascular: Regular rate and rhythm normal S1-S2 no murmurs rubs or gallops Respiratory: Lungs are clear to auscultation bilaterally no wheezing rhonchi rales Abdomen: Soft nontender nondistended Extremities: Status post right hip hemiarthroplasty, surgical incision site appears clean, no evidence of infection or hematoma  Discharge Instructions You were cared for by a hospitalist during your hospital stay. If you have any questions about your discharge medications or the care you received while you were in the hospital after you are discharged, you can call the unit and asked to speak with the hospitalist on call if the hospitalist that took care of you is not available. Once you are discharged, your primary care physician will handle any further medical issues. Please note that NO REFILLS for any discharge medications will be authorized once you are discharged, as it is imperative that you return to your primary care physician (or establish a relationship with a primary care physician if you do not have one) for your aftercare needs so that they can reassess your need for medications and monitor your lab values.  Discharge Instructions   Call MD for:  difficulty breathing, headache or visual disturbances    Complete by:  As directed      Call MD for:  extreme fatigue    Complete by:  As directed      Call MD for:  hives    Complete by:  As directed      Call MD for:  persistant dizziness or light-headedness    Complete by:  As directed      Call MD for:  persistant nausea and vomiting    Complete by:  As directed      Call MD for:  redness, tenderness, or signs  of infection (pain, swelling, redness, odor or green/yellow discharge around incision site)    Complete by:  As directed      Call MD for:  severe uncontrolled pain    Complete by:  As directed      Call MD for:  temperature >100.4    Complete by:  As directed      Diet - low sodium heart healthy    Complete by:  As directed      Increase activity slowly    Complete by:  As directed           Current Discharge Medication List    START taking these medications   Details  bisacodyl (DULCOLAX) 10 MG suppository Place 1 suppository (10 mg total) rectally daily as needed for moderate constipation. Qty: 12 suppository, Refills: 0    docusate sodium 100 MG CAPS Take 100 mg by mouth 2 (two) times daily. Qty: 10 capsule, Refills: 0    enoxaparin (LOVENOX) 40 MG/0.4ML injection Inject 0.4 mLs (40 mg total) into the skin daily. Qty: 10 Syringe, Refills: 0    HYDROcodone-acetaminophen (NORCO/VICODIN) 5-325 MG per tablet Take 1-2 tablets by mouth every 6 (six) hours as needed for moderate pain. Qty: 15 tablet, Refills: 0      CONTINUE these medications which have NOT CHANGED   Details  divalproex (DEPAKOTE ER) 250 MG 24 hr tablet Take 250 mg by mouth daily after lunch.    donepezil (ARICEPT) 10 MG tablet Take 10 mg by mouth at bedtime.     memantine (NAMENDA) 10 MG tablet Take 10 mg by mouth daily.    sertraline (ZOLOFT) 50 MG tablet Take 50 mg by mouth daily.    vitamin B-12 (CYANOCOBALAMIN) 100 MCG tablet Take 100 mcg by mouth daily as needed (for vitamin).       Allergies  Allergen Reactions  . Niacin And Related Other (See Comments)    Turned red all over  . Terazosin    Follow-up Information   Follow up with Unice Cobble, MD In 2 weeks.   Specialty:  Internal Medicine   Contact information:   [phone] [place] [address] [phone]       Follow up with Rozanna Box, MD In 2 weeks.   Specialty:  Orthopedic Surgery   Contact information:   [address] [place] [address] [phone]        The results of significant diagnostics from this hospitalization (including imaging, microbiology, ancillary and laboratory) are listed below for reference.    Significant Diagnostic Studies: Dg Chest 1 View  10/08/2014   CLINICAL DATA:  [place] off a ladder.  Mental status change.  EXAM: CHEST - 1 VIEW  COMPARISON:  05/18/2010  FINDINGS: The cardiac silhouette, mediastinal and hilar contours are within normal limits. There is tortuosity and calcification of the thoracic aorta. The lungs are clear. No pleural effusion. The bony thorax is intact.  IMPRESSION: No acute cardiopulmonary findings and intact bony thorax.   Electronically Signed   By:  Kalman Jewels M.D.   On: 10/08/2014 14:41   Dg Hip Complete Right  10/08/2014   CLINICAL DATA:  [place] off ladder today.  Right hip pain  EXAM: RIGHT HIP - COMPLETE 2+ VIEW  COMPARISON:  None.  FINDINGS: Right femoral neck fracture. No significant displacement. Right hip joint is normal.  IMPRESSION: Nondisplaced fracture right femoral neck.   Electronically Signed   By: Franchot Gallo M.D.   On: 10/08/2014 14:35   Dg Femur Right  10/08/2014   CLINICAL DATA:  [place] off ladder today  EXAM: RIGHT FEMUR - 2 VIEW  COMPARISON:  Right hip from today  FINDINGS: Right femoral neck fracture. No other fracture of the femur. The knee joint appears normal.  IMPRESSION: Femoral neck fracture.   Electronically Signed   By: Franchot Gallo M.D.   On: 10/08/2014 14:36   Ct Head Wo Contrast  10/08/2014   CLINICAL DATA:  Unwitnessed fall.  Patient found on ground.  EXAM: CT HEAD WITHOUT CONTRAST  CT CERVICAL SPINE WITHOUT CONTRAST  TECHNIQUE: Multidetector CT imaging of the head and cervical spine was performed following the standard protocol without intravenous contrast. Multiplanar CT image reconstructions of the cervical spine were also generated.  COMPARISON:  05/18/2010 -CT C spine  FINDINGS: CT HEAD  FINDINGS  There is no evidence of mass effect, midline shift, or extra-axial fluid collections. There is no evidence of a space-occupying lesion or intracranial hemorrhage. There is no evidence of a cortical-based area of acute infarction. There is generalized cerebral atrophy. There is periventricular white matter low attenuation likely secondary to microangiopathy.  The ventricles and sulci are appropriate for the patient's age. The basal cisterns are patent.  Visualized portions of the orbits are unremarkable. There is mild right sphenoid sinus mucosal thickening. Cerebrovascular atherosclerotic calcifications are noted.  The osseous structures are unremarkable.  CT CERVICAL SPINE FINDINGS  The alignment is anatomic. The vertebral body heights are maintained. Incidental note is made of incomplete fusion of the posterior arch of C1. There is no acute fracture. There is no static listhesis. The prevertebral soft tissues are normal. The intraspinal soft tissues are not fully imaged on this examination due to poor soft tissue contrast, but there is no gross soft tissue abnormality.  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 There is degenerative disc disease at C3-4, C4-5 C6-7. Bilateral uncovertebral degenerative changes at C3-4. Mild broad-based disc bulge at C4-5. Mild broad-based disc osteophyte complex at C5-6 with bilateral uncovertebral degenerative changes resulting in mild left foraminal encroachment. Mild broad-based disc bulge at C6-7 with bilateral uncovertebral degenerative changes.  The visualized portions of the lung apices demonstrate no focal abnormality.  IMPRESSION: 1. No acute intracranial pathology. 2. No acute osseous injury of the cervical spine.   Electronically Signed   By: Kathreen Devoid   On: 10/08/2014 14:49   Ct Cervical Spine Wo Contrast  10/08/2014   CLINICAL DATA:  Unwitnessed fall.  Patient found on ground.  EXAM: CT HEAD WITHOUT CONTRAST  CT CERVICAL SPINE WITHOUT  CONTRAST  TECHNIQUE: Multidetector CT imaging of the head and cervical spine was performed following the standard protocol without intravenous contrast. Multiplanar CT image reconstructions of the cervical spine were also generated.  COMPARISON:  05/18/2010 -CT C spine  FINDINGS: CT HEAD FINDINGS  There is no evidence of mass effect, midline shift, or extra-axial fluid collections. There is no evidence of a space-occupying lesion or intracranial hemorrhage. There is no evidence of a cortical-based area  of acute infarction. There is generalized cerebral atrophy. There is periventricular white matter low attenuation likely secondary to microangiopathy.  The ventricles and sulci are appropriate for the patient's age. The basal cisterns are patent.  Visualized portions of the orbits are unremarkable. There is mild right sphenoid sinus mucosal thickening. Cerebrovascular atherosclerotic calcifications are noted.  The osseous structures are unremarkable.  CT CERVICAL SPINE FINDINGS  The alignment is anatomic. The vertebral body heights are maintained. Incidental note is made of incomplete fusion of the posterior arch of C1. There is no acute fracture. There is no static listhesis. The prevertebral soft tissues are normal. The intraspinal soft tissues are not fully imaged on this examination due to poor soft tissue contrast, but there is no gross soft tissue abnormality.  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 There is degenerative disc disease at C3-4, C4-5 C6-7. Bilateral uncovertebral degenerative changes at C3-4. Mild broad-based disc bulge at C4-5. Mild broad-based disc osteophyte complex at C5-6 with bilateral uncovertebral degenerative changes resulting in mild left foraminal encroachment. Mild broad-based disc bulge at C6-7 with bilateral uncovertebral degenerative changes.  The visualized portions of the lung apices demonstrate no focal abnormality.  IMPRESSION: 1. No acute intracranial pathology. 2.  No acute osseous injury of the cervical spine.   Electronically Signed   By: Kathreen Devoid   On: 10/08/2014 14:49   Pelvis Portable  10/09/2014   CLINICAL DATA:  Right hip replacement. Postop. Subsequent encounter  EXAM: PORTABLE PELVIS 1-2 VIEWS  COMPARISON:  None.  FINDINGS: Right hip hemiarthroplasty in place. No periprosthetic fracture. No acute findings in the lower pelvis or left hip.  IMPRESSION: Right hip hemiarthroplasty.  No acute findings.   Electronically Signed   By: Jorje Guild M.D.   On: 10/09/2014 19:04   Dg Hip Portable 1 View Right  10/10/2014   CLINICAL DATA:  Status post right hip hemiarthroplasty  EXAM: PORTABLE RIGHT HIP - 1 VIEW  COMPARISON:  10/08/2014  FINDINGS: A new right hip hemiarthroplasty is noted in satisfactory position. No acute abnormality is noted. No gross soft tissue abnormality is noted.  IMPRESSION: Status post right hip hemiarthroplasty without acute abnormality.   Electronically Signed   By: [name] M.D.   On: 10/10/2014 09:09    Microbiology: Recent Results (from the past 240 hour(s))  SURGICAL PCR SCREEN     Status: None   Collection Time    10/09/14  8:07 AM      Result Value Ref Range Status   MRSA, PCR NEGATIVE  NEGATIVE Final   Staphylococcus aureus NEGATIVE  NEGATIVE Final   Comment:            The Xpert SA Assay (FDA     approved for NASAL specimens     in patients over 68 years of age),     is one component of     a comprehensive surveillance     program.  Test performance has     been validated by [employer] for patients greater     than or equal to 31 year old.     It is not intended     to diagnose infection nor to     guide or monitor treatment.     Labs: Basic Metabolic Panel:  Recent Labs Lab 10/08/14 1504 10/09/14 0516 10/10/14 0712 10/11/14 0558  NA 142 139 139 139  K 3.8 3.7 3.8 3.6*  CL 103 103 102 101  CO2  --  21 24 26   GLUCOSE 108* 120* 130* 113*  BUN 10 9 9 10   CREATININE 1.00 0.93 1.08 0.94   CALCIUM  --  9.1 8.6 8.6   Liver Function Tests: No results found for this basename: AST, ALT, ALKPHOS, BILITOT, PROT, ALBUMIN,  in the last 168 hours No results found for this basename: LIPASE, AMYLASE,  in the last 168 hours No results found for this basename: AMMONIA,  in the last 168 hours CBC:  Recent Labs Lab 10/08/14 1455 10/08/14 1504 10/09/14 0516 10/10/14 0712 10/11/14 0558  WBC 9.5  --  9.8 11.1* 10.7*  NEUTROABS 7.8*  --   --   --   --   HGB 13.9 16.0 11.8* 11.1* 9.6*  HCT 42.9 47.0 36.3* 33.8* 30.1*  MCV 91.7  --  90.5 90.6 91.8  PLT 228  --  228 206 182   Cardiac Enzymes: No results found for this basename: CKTOTAL, CKMB, CKMBINDEX, TROPONINI,  in the last 168 hours BNP: BNP (last 3 results) No results found for this basename: PROBNP,  in the last 8760 hours CBG: No results found for this basename: GLUCAP,  in the last 168 hours     Signed:  Kelvin Cellar  [facility] 10/11/2014, 11:55 AM

## 2014-10-12 ENCOUNTER — Encounter (HOSPITAL_COMMUNITY): Payer: Self-pay | Admitting: Orthopedic Surgery

## 2014-10-12 ENCOUNTER — Telehealth: Payer: Self-pay | Admitting: *Deleted

## 2014-10-12 NOTE — Telephone Encounter (Signed)
Called pt concerning TCM appt no answer LMOM RTC.../lmb 

## 2014-10-15 ENCOUNTER — Encounter: Payer: Self-pay | Admitting: Internal Medicine

## 2014-10-15 DIAGNOSIS — D519 Vitamin B12 deficiency anemia, unspecified: Secondary | ICD-10-CM | POA: Insufficient documentation

## 2014-10-15 DIAGNOSIS — D62 Acute posthemorrhagic anemia: Secondary | ICD-10-CM | POA: Insufficient documentation

## 2014-10-15 NOTE — Assessment & Plan Note (Signed)
Moderatel elevated, on no meds;will monitor

## 2014-10-15 NOTE — Assessment & Plan Note (Signed)
Post op Hb 9.6-continue daily B12; no tx required

## 2014-10-15 NOTE — Assessment & Plan Note (Signed)
S/p total hip;lovenox for 10 days;OT/PT

## 2014-10-15 NOTE — Telephone Encounter (Signed)
Called pt again still no answer LMOM RTC.../lmb 

## 2014-10-15 NOTE — Assessment & Plan Note (Signed)
Post op Hb 9.6-continue daily B12

## 2014-10-15 NOTE — Assessment & Plan Note (Signed)
Continue aricept, namenda, zoloft and depakote

## 2014-10-16 ENCOUNTER — Other Ambulatory Visit: Payer: Self-pay | Admitting: *Deleted

## 2014-10-16 MED ORDER — LORAZEPAM 2 MG/ML IJ SOLN: INTRAMUSCULAR | Status: DC

## 2014-10-16 NOTE — Telephone Encounter (Signed)
[facility] 

## 2014-10-16 NOTE — Telephone Encounter (Signed)
Tried to contact pt several times for TCM appt no answer and haven't received call bck. Closing phone note...Christopher Summers

## 2014-10-17 ENCOUNTER — Telehealth: Payer: Self-pay | Admitting: *Deleted

## 2014-10-17 NOTE — Telephone Encounter (Signed)
Pt wife call back she stated that pt does to the [place] [facility] in [place] now. He haven't seen md since 2013. Pt just had hip replacement and he is currently in rehab. He is to follow-up with Dr. Marcelino Scot- orthopedic surgeon. Inform wife will update and advise for him to [facility] " Dr[facility] Dell Ponto" when he is able too...[name]

## 2014-10-18 ENCOUNTER — Non-Acute Institutional Stay (SKILLED_NURSING_FACILITY): Payer: Medicare Other | Admitting: Internal Medicine

## 2014-10-18 DIAGNOSIS — G309 Alzheimer's disease, unspecified: Secondary | ICD-10-CM

## 2014-10-18 DIAGNOSIS — F0281 Dementia in other diseases classified elsewhere with behavioral disturbance: Secondary | ICD-10-CM

## 2014-10-18 DIAGNOSIS — S72001D Fracture of unspecified part of neck of right femur, subsequent encounter for closed fracture with routine healing: Secondary | ICD-10-CM

## 2014-10-18 DIAGNOSIS — F02818 Dementia in other diseases classified elsewhere, unspecified severity, with other behavioral disturbance: Secondary | ICD-10-CM

## 2014-10-18 DIAGNOSIS — G308 Other Alzheimer's disease: Secondary | ICD-10-CM

## 2014-10-18 NOTE — Assessment & Plan Note (Signed)
Pt is s/p arthroplasty and therefore at risk for DVT; ordered U/S RLE

## 2014-10-18 NOTE — Progress Notes (Signed)
MRN: [id number] Name: Christopher Summers  Sex: male Age: 73 y.o. DOB: Apr 13, 1941  [place] #: Helene Kelp Facility/Room: 115A Level Of Care: SNF Provider: Inocencio Homes D Emergency Contacts: Extended Emergency Contact Information Primary Emergency Contact: Lubeck,Betty J Address: [address]          [place],  [address] [place] Phone: [phone] Mobile Phone: [phone] Relation: Spouse  Code Status: FULL  Allergies: Niacin and related and Terazosin  Chief Complaint  Patient presents with  . Acute Visit    HPI: Patient is 73 y.o. male who nursing has asked me to see because he is having more behavoirs. He also has swelling R leg noted today.  Past Medical History  Diagnosis Date  . BPH (benign prostatic hypertrophy)   . Depression   . Essential hypertension, benign   . Reflux   . Dementia     Past Surgical History  Procedure Laterality Date  . Transurethral resection of prostate    . Hip arthroplasty Right 10/09/2014    Procedure: Right Hemiarthroplasty;  Surgeon: Rozanna Box, MD;  Location: [facility];  Service: Orthopedics;  Laterality: Right;      Medication List       This list is accurate as of: 10/18/14 11:59 PM.  Always use your most recent med list.               bisacodyl 10 MG suppository  Commonly known as:  DULCOLAX  Place 1 suppository (10 mg total) rectally daily as needed for moderate constipation.     divalproex 250 MG 24 hr tablet  Commonly known as:  DEPAKOTE ER  Take 250 mg by mouth daily after lunch.     donepezil 10 MG tablet  Commonly known as:  ARICEPT  Take 10 mg by mouth at bedtime.     DSS 100 MG Caps  Take 100 mg by mouth 2 (two) times daily.     enoxaparin 40 MG/0.4ML injection  Commonly known as:  LOVENOX  Inject 0.4 mLs (40 mg total) into the skin daily.     HYDROcodone-acetaminophen 5-325 MG per tablet  Commonly known as:  NORCO/VICODIN  Take 1-2 tablets by mouth every 6 (six) hours as  needed for moderate pain.     LORazepam 2 MG/ML injection  Commonly known as:  ATIVAN  Give 0.65ml intramuscular every 6 hours as needed for agitation; Give 39ml intramuscular every 6 hours as needed for severe agitation     memantine 10 MG tablet  Commonly known as:  NAMENDA  Take 10 mg by mouth daily.     sertraline 50 MG tablet  Commonly known as:  ZOLOFT  Take 50 mg by mouth daily.     vitamin B-12 100 MCG tablet  Commonly known as:  CYANOCOBALAMIN  Take 100 mcg by mouth daily as needed (for vitamin).        No orders of the defined types were placed in this encounter.     There is no immunization history on file for this patient.  History  Substance Use Topics  . Smoking status: Current Every Day Smoker -- 0.50 packs/day    Types: Cigarettes  . Smokeless tobacco: Not on file  . Alcohol Use: No    Review of Systems  DATA OBTAINED: from nurse GENERAL:  no fevers, fatigue, appetite changes; family insists we not use ativan, later per phone after wife was called to attend pt with behavoirs she requested restoril SKIN: No itching, rash  HEENT: No complaint RESPIRATORY: No cough, wheezing, SOB CARDIAC: No chest pain, palpitations,+ R lower extremity edema  GI: No abdominal pain, No N/V/D or constipation, No heartburn or reflux  GU: No dysuria, frequency or urgency, or incontinence  MUSCULOSKELETAL: No unrelieved bone/joint pain NEUROLOGIC: No headache, dizziness  PSYCHIATRIC: No overt anxiety or sadness  Filed Vitals:   10/18/14 1953  BP: 139/79  Pulse: 96  Temp: 97.6 F (36.4 C)  Resp: 20    Physical Exam  GENERAL APPEARANCE: Alert, conversant, No acute distress  SKIN: No diaphoresis rash HEENT: Unremarkable RESPIRATORY: Breathing is even, unlabored. Lung sounds are clear   CARDIOVASCULAR: Heart RRR no murmurs, rubs or gallops. 1+ peripheral edema foot and thigh GASTROINTESTINAL: Abdomen is soft, non-tender, not distended w/ normal bowel sounds.   GENITOURINARY: Bladder non tender, not distended  MUSCULOSKELETAL: No abnormal joints or musculature NEUROLOGIC: Cranial nerves 2-12 grossly intact. Moves all extremities PSYCHIATRIC: , is requiring 1:1 attention to keep him seated for longer than 5 second; +s behavioral issues  Patient Active Problem List   Diagnosis Date Noted  . 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 Acute blood loss anemia 10/15/2014  . Hip fracture, right 10/08/2014  . Alzheimer's dementia with behavioral disturbance 10/08/2014  . Preoperative evaluation 10/08/2014  . DEPRESSIVE DISORDER, MAJOR RCR, UNSPECIFIED 10/05/2007  . HYPERTENSION, BENIGN 10/05/2007  . CYSTITIS, ACUTE 10/05/2007  . BENIGN PROSTATIC HYPERTROPHY, HX OF, S/P TURP 10/05/2007    CBC    Component Value Date/Time   WBC 10.7* 10/11/2014 0558   RBC 3.28* 10/11/2014 0558   HGB 9.6* 10/11/2014 0558   HCT 30.1* 10/11/2014 0558   PLT 182 10/11/2014 0558   MCV 91.8 10/11/2014 0558   LYMPHSABS 1.0 10/08/2014 1455   MONOABS 0.6 10/08/2014 1455   EOSABS 0.0 10/08/2014 1455   BASOSABS 0.0 10/08/2014 1455    CMP     Component Value Date/Time   NA 139 10/11/2014 0558   K 3.6* 10/11/2014 0558   CL 101 10/11/2014 0558   CO2 26 10/11/2014 0558   GLUCOSE 113* 10/11/2014 0558   BUN 10 10/11/2014 0558   CREATININE 0.94 10/11/2014 0558   CALCIUM 8.6 10/11/2014 0558   PROT 7.9 09/24/2007 1115   ALBUMIN 3.7 09/24/2007 1115   AST 21 09/24/2007 1115   ALT 21 09/24/2007 1115   ALKPHOS 71 09/24/2007 1115   BILITOT 1.5* 09/24/2007 1115   GFRNONAA 82* 10/11/2014 0558   GFRAA >90 10/11/2014 0558    Assessment and Plan  Hip fracture, right Pt is s/p arthroplasty and therefore at risk for DVT; ordered U/S RLE  Alzheimer's dementia with behavioral disturbance Pt acting out and its probably just dementia;will check urine CBC, BMP    Hennie Duos, MD

## 2014-10-19 ENCOUNTER — Other Ambulatory Visit: Payer: Self-pay | Admitting: *Deleted

## 2014-10-19 MED ORDER — TEMAZEPAM 15 MG PO CAPS: ORAL_CAPSULE | ORAL | Status: DC

## 2014-10-19 NOTE — Telephone Encounter (Signed)
[facility] 

## 2014-10-20 ENCOUNTER — Encounter: Payer: Self-pay | Admitting: Internal Medicine

## 2014-10-20 NOTE — Assessment & Plan Note (Signed)
Pt acting out and its probably just dementia;will check urine CBC, BMP

## 2014-10-21 ENCOUNTER — Emergency Department (HOSPITAL_COMMUNITY)
Admission: EM | Admit: 2014-10-21 | Discharge: 2014-10-22 | Disposition: A | Discharge status: Home / Self Care | Payer: Medicare Other | Attending: Emergency Medicine | Admitting: Emergency Medicine

## 2014-10-21 ENCOUNTER — Encounter (HOSPITAL_COMMUNITY): Payer: Self-pay

## 2014-10-21 DIAGNOSIS — Z87448 Personal history of other diseases of urinary system: Secondary | ICD-10-CM | POA: Insufficient documentation

## 2014-10-21 DIAGNOSIS — Y939 Activity, unspecified: Secondary | ICD-10-CM | POA: Insufficient documentation

## 2014-10-21 DIAGNOSIS — Y998 Other external cause status: Secondary | ICD-10-CM | POA: Insufficient documentation

## 2014-10-21 DIAGNOSIS — I10 Essential (primary) hypertension: Secondary | ICD-10-CM | POA: Diagnosis not present

## 2014-10-21 DIAGNOSIS — S79911A Unspecified injury of right hip, initial encounter: Secondary | ICD-10-CM | POA: Insufficient documentation

## 2014-10-21 DIAGNOSIS — Z79899 Other long term (current) drug therapy: Secondary | ICD-10-CM | POA: Insufficient documentation

## 2014-10-21 DIAGNOSIS — Z8719 Personal history of other diseases of the digestive system: Secondary | ICD-10-CM | POA: Insufficient documentation

## 2014-10-21 DIAGNOSIS — Z96641 Presence of right artificial hip joint: Secondary | ICD-10-CM | POA: Diagnosis not present

## 2014-10-21 DIAGNOSIS — Z720 Tobacco use: Secondary | ICD-10-CM | POA: Insufficient documentation

## 2014-10-21 DIAGNOSIS — Y92129 Unspecified place in nursing home as the place of occurrence of the external cause: Secondary | ICD-10-CM | POA: Diagnosis not present

## 2014-10-21 DIAGNOSIS — W19XXXA Unspecified fall, initial encounter: Secondary | ICD-10-CM | POA: Diagnosis not present

## 2014-10-21 DIAGNOSIS — F0390 Unspecified dementia without behavioral disturbance: Secondary | ICD-10-CM | POA: Insufficient documentation

## 2014-10-21 DIAGNOSIS — F329 Major depressive disorder, single episode, unspecified: Secondary | ICD-10-CM | POA: Insufficient documentation

## 2014-10-21 NOTE — ED Notes (Signed)
Per [facility] pt from [place], staff reports pt fell approx 2245 and c/o Right hip pain. Pt recently had a Right hip replacement. No deformity, rotation, or shortening noted.

## 2014-10-22 ENCOUNTER — Emergency Department (HOSPITAL_COMMUNITY): Payer: Medicare Other

## 2014-10-22 LAB — CBC WITH DIFFERENTIAL/PLATELET
BASOS ABS: 0 10*3/uL (ref 0.0–0.1)
BASOS PCT: 0 % (ref 0–1)
Eosinophils Absolute: 0.2 10*3/uL (ref 0.0–0.7)
Eosinophils Relative: 2 % (ref 0–5)
HEMATOCRIT: 30.2 % — AB (ref 39.0–52.0)
Hemoglobin: 9.5 g/dL — ABNORMAL LOW (ref 13.0–17.0)
Lymphocytes Relative: 14 % (ref 12–46)
Lymphs Abs: 1.4 10*3/uL (ref 0.7–4.0)
MCH: 28.2 pg (ref 26.0–34.0)
MCHC: 31.5 g/dL (ref 30.0–36.0)
MCV: 89.6 fL (ref 78.0–100.0)
MONO ABS: 0.7 10*3/uL (ref 0.1–1.0)
Monocytes Relative: 7 % (ref 3–12)
NEUTROS PCT: 77 % (ref 43–77)
Neutro Abs: 7.5 10*3/uL (ref 1.7–7.7)
Platelets: 771 10*3/uL — ABNORMAL HIGH (ref 150–400)
RBC: 3.37 MIL/uL — ABNORMAL LOW (ref 4.22–5.81)
RDW: 12.9 % (ref 11.5–15.5)
WBC: 9.8 10*3/uL (ref 4.0–10.5)

## 2014-10-22 LAB — BASIC METABOLIC PANEL
ANION GAP: 12 (ref 5–15)
BUN: 10 mg/dL (ref 6–23)
CO2: 28 mEq/L (ref 19–32)
Calcium: 9.5 mg/dL (ref 8.4–10.5)
Chloride: 99 mEq/L (ref 96–112)
Creatinine, Ser: 0.8 mg/dL (ref 0.50–1.35)
GFR calc non Af Amer: 88 mL/min — ABNORMAL LOW (ref >90)
Glucose, Bld: 100 mg/dL — ABNORMAL HIGH (ref 70–99)
Potassium: 3.9 mEq/L (ref 3.7–5.3)
Sodium: 139 mEq/L (ref 137–147)

## 2014-10-22 LAB — URINALYSIS, ROUTINE W REFLEX MICROSCOPIC
BILIRUBIN URINE: NEGATIVE
Glucose, UA: NEGATIVE mg/dL
Hgb urine dipstick: NEGATIVE
Ketones, ur: NEGATIVE mg/dL
Leukocytes, UA: NEGATIVE
NITRITE: NEGATIVE
PROTEIN: NEGATIVE mg/dL
SPECIFIC GRAVITY, URINE: 1.007 (ref 1.005–1.030)
UROBILINOGEN UA: 0.2 mg/dL (ref 0.0–1.0)
pH: 7.5 (ref 5.0–8.0)

## 2014-10-22 NOTE — ED Provider Notes (Signed)
CSN: [id number]     Arrival date & time 10/21/14  2314 History   First MD Initiated Contact with Patient 10/21/14 2315     Chief Complaint  Patient presents with  . Fall     (Consider location/radiation/quality/duration/timing/severity/associated sxs/prior Treatment) HPI  Christopher Summers is a 73 y.o. male with past medical history of dementia, recent fall on October 28 with R femoral neck fracture s/p R hip hemiarthroplasty, presenting today after another fall. Patient comes from nursing facility and was found down. He is describing worsening pain on the right side. Patient has history of dementia and cannot provide detailed history.  The fall occurred at approximately 10:45 PM. Nursing home and EMS is unable to provide further history.   Past Medical History  Diagnosis Date  . BPH (benign prostatic hypertrophy)   . Depression   . Essential hypertension, benign   . Reflux   . Dementia    Past Surgical History  Procedure Laterality Date  . Transurethral resection of prostate    . Hip arthroplasty Right 10/09/2014    Procedure: Right Hemiarthroplasty;  Surgeon: Rozanna Box, MD;  Location: [place];  Service: Orthopedics;  Laterality: Right;   Family History  Problem Relation Age of Onset  . Heart disease Brother    History  Substance Use Topics  . Smoking status: Current Every Day Smoker -- 0.50 packs/day    Types: Cigarettes  . Smokeless tobacco: Not on file  . Alcohol Use: No    Review of Systems  Unable to perform ROS: Dementia      Allergies  Niacin and related and Terazosin  Home Medications   Prior to Admission medications   Medication Sig Start Date End Date Taking? Authorizing Provider  divalproex (DEPAKOTE ER) 250 MG 24 hr tablet Take 250 mg by mouth daily after lunch.   Yes Historical Provider, MD  docusate sodium 100 MG CAPS Take 100 mg by mouth 2 (two) times daily. 10/11/14  Yes Kelvin Cellar, MD  donepezil (ARICEPT) 10 MG tablet Take 10 mg by  mouth at bedtime.    Yes Historical Provider, MD  enoxaparin (LOVENOX) 40 MG/0.4ML injection Inject 0.4 mLs (40 mg total) into the skin daily. Patient taking differently: Inject 40 mg into the skin daily. For 10 days 10/11/14  Yes Kelvin Cellar, MD  HYDROcodone-acetaminophen (NORCO/VICODIN) 5-325 MG per tablet Take 1 tablet by mouth every 6 (six) hours as needed for moderate pain.   Yes Historical Provider, MD  LORazepam (ATIVAN) 2 MG/ML injection Give 0.75ml intramuscular every 6 hours as needed for agitation; Give 67ml intramuscular every 6 hours as needed for severe agitation 10/16/14  Yes Mahima Pandey, MD  LORazepam (ATIVAN) 2 MG/ML injection Inject 1 mg into the vein every 6 (six) hours as needed (severe agitation).   Yes Historical Provider, MD  memantine (NAMENDA) 10 MG tablet Take 10 mg by mouth daily.   Yes Historical Provider, MD  sertraline (ZOLOFT) 50 MG tablet Take 50 mg by mouth daily.   Yes Historical Provider, MD  temazepam (RESTORIL) 15 MG capsule Take one capsule by mouth at bedtime 10/19/14  Yes Monina C Medina-Vargas, NP  UNABLE TO FIND Take 120 mLs by mouth daily. med pass   Yes Historical Provider, MD  vitamin B-12 (CYANOCOBALAMIN) 100 MCG tablet Take 100 mcg by mouth daily.   Yes Historical Provider, MD  bisacodyl (DULCOLAX) 10 MG suppository Place 1 suppository (10 mg total) rectally daily as needed for moderate constipation. 10/11/14  Kelvin Cellar, MD  HYDROcodone-acetaminophen (NORCO/VICODIN) 5-325 MG per tablet Take 1-2 tablets by mouth every 6 (six) hours as needed for moderate pain. 10/11/14   Kelvin Cellar, MD  vitamin B-12 (CYANOCOBALAMIN) 100 MCG tablet Take 100 mcg by mouth daily as needed (for vitamin).    Historical Provider, MD   BP 151/65 mmHg  Pulse 98  Temp(Src) 97.8 F (36.6 C) (Rectal)  Resp 29  Ht 5\' 7"  (1.702 m)  Wt 153 lb (69.4 kg)  BMI 23.96 kg/m2  SpO2 100% Physical Exam  Constitutional: Vital signs are normal. He appears well-developed and  well-nourished.  Non-toxic appearance. He does not appear ill. No distress.  HENT:  Head: Normocephalic and atraumatic.  Nose: Nose normal.  Mouth/Throat: Oropharynx is clear and moist. No oropharyngeal exudate.  Eyes: Conjunctivae and EOM are normal. Pupils are equal, round, and reactive to light. No scleral icterus.  Neck: Normal range of motion. Neck supple. No tracheal deviation, no edema, no erythema and normal range of motion present. No thyroid mass and no thyromegaly present.  Cardiovascular: Normal rate, regular rhythm, S1 normal, S2 normal, normal heart sounds, intact distal pulses and normal pulses.  Exam reveals no gallop and no friction rub.   No murmur heard. Pulses:      Radial pulses are 2+ on the right side, and 2+ on the left side.       Dorsalis pedis pulses are 2+ on the right side, and 2+ on the left side.  Pulmonary/Chest: Effort normal and breath sounds normal. No respiratory distress. He has no wheezes. He has no rhonchi. He has no rales.  Abdominal: Soft. Normal appearance and bowel sounds are normal. He exhibits no distension, no ascites and no mass. There is no hepatosplenomegaly. There is no tenderness. There is no rebound, no guarding and no CVA tenderness.  Musculoskeletal: He exhibits tenderness. He exhibits no edema.  Right hip with tenderness to palpation, appropriate postoperative. Sutures are in place which are clean dry and intact. Bony deformity is palpated however there is no shortening or rotation. 2+ pulses distally and normal sensation.  Lymphadenopathy:    He has no cervical adenopathy.  Neurological: He has normal strength. No cranial nerve deficit or sensory deficit. He exhibits normal muscle tone. GCS eye subscore is 4. GCS verbal subscore is 5. GCS motor subscore is 6.  Skin: Skin is warm, dry and intact. No petechiae and no rash noted. He is not diaphoretic. No erythema. No pallor.  Psychiatric:  dementia  Nursing note and vitals reviewed.   ED  Course  Procedures (including critical care time) Labs Review Labs Reviewed  CBC WITH DIFFERENTIAL - Abnormal; Notable for the following:    RBC 3.37 (*)    Hemoglobin 9.5 (*)    HCT 30.2 (*)    Platelets 771 (*)    All other components within normal limits  BASIC METABOLIC PANEL - Abnormal; Notable for the following:    Glucose, Bld 100 (*)    GFR calc non Af Amer 88 (*)    All other components within normal limits  URINE CULTURE  URINALYSIS, ROUTINE W REFLEX MICROSCOPIC    Imaging Review Dg Chest 1 View  10/22/2014   CLINICAL DATA:  Status post fall. Right hip pain. Altered mental status.  EXAM: CHEST - 1 VIEW  COMPARISON:  Single view of the chest 10/08/2014.  FINDINGS: The lungs are clear. Lung volumes are lower than on the comparison study. No pneumothorax or pleural effusion is identified.  Heart size is normal. No focal bony abnormality is identified.  IMPRESSION: No acute abnormality.   Electronically Signed   By: Inge Rise M.D.   On: 10/22/2014 00:55   Dg Hip Complete Right  10/22/2014   CLINICAL DATA:  Status post fall.  Right hip pain.  EXAM: RIGHT HIP - COMPLETE 2+ VIEW  COMPARISON:  Plain film of the pelvis 10/10/2014.  FINDINGS: Bipolar right hip hemiarthroplasty is again seen. The device is located. No fracture is identified. The left hip and pelvis are unremarkable.  IMPRESSION: No acute finding.  Status post right hip replacement.   Electronically Signed   By: Inge Rise M.D.   On: 10/22/2014 00:56     EKG Interpretation   Date/Time:  Monday October 22 2014 00:11:54 EST Ventricular Rate:  89 PR Interval:  121 QRS Duration: 105 QT Interval:  383 QTC Calculation: 466 R Axis:   6 Text Interpretation:  Sinus rhythm Inferoposterior infarct, old Left axis  deviation No significant change since last tracing Confirmed by Glynn Octave [phone]) on 10/22/2014 12:30:16 AM      MDM   Final diagnoses:  Fall    Patient presents emergency  department after a fall at a nursing facility. Will obtain x-ray of right hip, as well as infectious workup.  Infectious workup is negative. X-rays do not reveal any acute change to his hip. EKG does not show etiology for syncope as cause for her fall. Patient remained at his baseline his vital signs are within his normal limits and he is safe for discharge back to the nursing facility.   Everlene Balls, MD 10/22/14 [phone]

## 2014-10-22 NOTE — ED Notes (Signed)
Pt transported home with all belongings, pt alert and oriented per his norm, pt with advance dementia, no new RX prescribed, family and facility reported understanding of discharge instructions, pt transported to SNF by [facility]

## 2014-10-22 NOTE — Discharge Instructions (Signed)
Fall Prevention and Home Safety Christopher Summers, you were seen today after a fall. Your x-rays do not show a new injury. Your laboratory studies and EKG do not show a cause for your fall. Follow-up with your primary care physician within 3 days for fall prevention. Follow-up with orthopedic surgery as scheduled for your hip. If any of your symptoms worsen come back to the emergency department immediately for repeat evaluation. Thank you. Falls cause injuries and can affect all age groups. It is possible to prevent falls.  HOW TO PREVENT FALLS  Wear shoes with rubber soles that do not have an opening for your toes.  Keep the inside and outside of your house well lit.  Use night lights throughout your home.  Remove clutter from floors.  Clean up floor spills.  Remove throw rugs or fasten them to the floor with carpet tape.  Do not place electrical cords across pathways.  Put grab bars by your tub, shower, and toilet. Do not use towel bars as grab bars.  Put handrails on both sides of the stairway. Fix loose handrails.  Do not climb on stools or stepladders, if possible.  Do not wax your floors.  Repair uneven or unsafe sidewalks, walkways, or stairs.  Keep items you use a lot within reach.  Be aware of pets.  Keep emergency numbers next to the telephone.  Put smoke detectors in your home and near bedrooms. Ask your doctor what other things you can do to prevent falls. Document Released: 09/26/2009 Document Revised: 05/31/2012 Document Reviewed: 03/01/2012 [facility] Patient Information 2015 [place], [place]. This information is not intended to replace advice given to you by your health care provider. Make sure you discuss any questions you have with your health care provider.

## 2014-10-23 LAB — URINE CULTURE
Colony Count: NO GROWTH
Culture: NO GROWTH

## 2014-10-26 ENCOUNTER — Non-Acute Institutional Stay (SKILLED_NURSING_FACILITY): Payer: Medicare Other | Admitting: Nurse Practitioner

## 2014-10-26 DIAGNOSIS — I82401 Acute embolism and thrombosis of unspecified deep veins of right lower extremity: Secondary | ICD-10-CM

## 2014-10-26 DIAGNOSIS — F0281 Dementia in other diseases classified elsewhere with behavioral disturbance: Secondary | ICD-10-CM

## 2014-10-26 DIAGNOSIS — G309 Alzheimer's disease, unspecified: Secondary | ICD-10-CM

## 2014-10-26 DIAGNOSIS — S72001D Fracture of unspecified part of neck of right femur, subsequent encounter for closed fracture with routine healing: Secondary | ICD-10-CM

## 2014-10-26 DIAGNOSIS — F411 Generalized anxiety disorder: Secondary | ICD-10-CM

## 2014-10-26 DIAGNOSIS — D62 Acute posthemorrhagic anemia: Secondary | ICD-10-CM

## 2014-10-26 DIAGNOSIS — G308 Other Alzheimer's disease: Secondary | ICD-10-CM

## 2014-10-26 DIAGNOSIS — F02818 Dementia in other diseases classified elsewhere, unspecified severity, with other behavioral disturbance: Secondary | ICD-10-CM

## 2014-10-26 DIAGNOSIS — I10 Essential (primary) hypertension: Secondary | ICD-10-CM

## 2014-10-26 DIAGNOSIS — N390 Urinary tract infection, site not specified: Secondary | ICD-10-CM

## 2014-10-26 NOTE — Progress Notes (Signed)
Patient ID: Christopher Summers, male   DOB: 03/31/1941, 73 y.o.   MRN: [id number]    Nursing Home Location:  [facility]   Place of Service: SNF (31)  PCP: Hennie Duos, MD  Allergies  Allergen Reactions  . Niacin And Related Other (See Comments)    Turned red all over  . Terazosin     From [facility]    Chief Complaint  Patient presents with  . Discharge Note    HPI:  Patient is a 73 y.o. male seen today at [facility] for discharge home with wife.  Pt had climbed a ladder to blow leaves off of his roof subsequently having a fall. He was unable to bear weight due to severe pain, brought to the emergency department where films revealed a right femoral neck fracture. Orthopedic surgery was consulted, as he was taken to the OR on 10/09/2014 undergoing right hemiarthroplasty. Patient tolerated procedure well and was discharged to [facility] for rehab. While at [facility] was diagnosed with UTI and is currently being treated with cipro with improvement in symptoms. Also noted to have swelling in LE and was dx with DVT. Patient currently doing well with therapy, now stable to discharge home with home health.                     Review of Systems:  Review of Systems  Constitutional: Negative for activity change, appetite change, fatigue and unexpected weight change.  HENT: Negative for congestion and hearing loss.   Eyes: Negative.   Respiratory: Negative for cough and shortness of breath.   Cardiovascular: Negative for chest pain, palpitations and leg swelling.  Gastrointestinal: Negative for abdominal pain, diarrhea and constipation.  Genitourinary: Negative for dysuria and difficulty urinating.  Musculoskeletal: Negative for myalgias and arthralgias.  Skin: Negative for color change and wound.  Neurological: Negative for dizziness and weakness.  Psychiatric/Behavioral: Negative for behavioral problems, confusion and agitation.    Past Medical History    Diagnosis Date  . BPH (benign prostatic hypertrophy)   . Depression   . Essential hypertension, benign   . Reflux   . Dementia    Past Surgical History  Procedure Laterality Date  . Transurethral resection of prostate    . Hip arthroplasty Right 10/09/2014    Procedure: Right Hemiarthroplasty;  Surgeon: Rozanna Box, MD;  Location: [place];  Service: Orthopedics;  Laterality: Right;   Social History:   reports that he has been smoking Cigarettes.  He has been smoking about 0.50 packs per day. He does not have any smokeless tobacco history on file. He reports that he does not drink alcohol or use illicit drugs.  Family History  Problem Relation Age of Onset  . Heart disease Brother     Medications: Patient's Medications  New Prescriptions   No medications on file  Previous Medications   BISACODYL (DULCOLAX) 10 MG SUPPOSITORY    Place 1 suppository (10 mg total) rectally daily as needed for moderate constipation.   DIVALPROEX (DEPAKOTE ER) 250 MG 24 HR TABLET    Take 250 mg by mouth daily after lunch.   DOCUSATE SODIUM 100 MG CAPS    Take 100 mg by mouth 2 (two) times daily.   DONEPEZIL (ARICEPT) 10 MG TABLET    Take 10 mg by mouth at bedtime.    HYDROCODONE-ACETAMINOPHEN (NORCO/VICODIN) 5-325 MG PER TABLET    Take 1-2 tablets by mouth every 6 (six) hours as needed for  moderate pain.   HYDROCODONE-ACETAMINOPHEN (NORCO/VICODIN) 5-325 MG PER TABLET    Take 1 tablet by mouth every 6 (six) hours as needed for moderate pain.   LORAZEPAM (ATIVAN) 2 MG/ML INJECTION    Inject 1 mg into the vein every 6 (six) hours as needed (severe agitation).   MEMANTINE (NAMENDA) 10 MG TABLET    Take 10 mg by mouth daily.   SERTRALINE (ZOLOFT) 50 MG TABLET    Take 50 mg by mouth daily.   TEMAZEPAM (RESTORIL) 15 MG CAPSULE    Take one capsule by mouth at bedtime   VITAMIN B-12 (CYANOCOBALAMIN) 100 MCG TABLET    Take 100 mcg by mouth daily as needed (for vitamin).  Modified Medications   No medications  on file  Discontinued Medications   ENOXAPARIN (LOVENOX) 40 MG/0.4ML INJECTION    Inject 0.4 mLs (40 mg total) into the skin daily.   LORAZEPAM (ATIVAN) 2 MG/ML INJECTION    Give 0.46ml intramuscular every 6 hours as needed for agitation; Give 59ml intramuscular every 6 hours as needed for severe agitation   UNABLE TO FIND    Take 120 mLs by mouth daily. med pass   VITAMIN B-12 (CYANOCOBALAMIN) 100 MCG TABLET    Take 100 mcg by mouth daily.     Physical Exam: Filed Vitals:   10/26/14 1403  BP: 149/93  Pulse: 86  Temp: 97.3 F (36.3 C)  Resp: 20    Physical Exam  Constitutional: He is oriented to person, place, and time. He appears well-developed and well-nourished. No distress.  HENT:  Head: Normocephalic and atraumatic.  Mouth/Throat: Oropharynx is clear and moist. No oropharyngeal exudate.  Eyes: Conjunctivae and EOM are normal. Pupils are equal, round, and reactive to light.  Neck: Normal range of motion. Neck supple.  Cardiovascular: Normal rate, regular rhythm and normal heart sounds.   Pulmonary/Chest: Effort normal and breath sounds normal.  Abdominal: Soft. Bowel sounds are normal.  Musculoskeletal: He exhibits no edema or tenderness.  Neurological: He is alert and oriented to person, place, and time.  Skin: Skin is warm and dry. He is not diaphoretic.  Psychiatric: He has a normal mood and affect.    Labs reviewed: Basic Metabolic Panel:  Recent Labs  10/10/14 0712 10/11/14 0558 10/22/14 0020  NA 139 139 139  K 3.8 3.6* 3.9  CL 102 101 99  CO2 24 26 28   GLUCOSE 130* 113* 100*  BUN 9 10 10   CREATININE 1.08 0.94 0.80  CALCIUM 8.6 8.6 9.5   Liver Function Tests: No results for input(s): AST, ALT, ALKPHOS, BILITOT, PROT, ALBUMIN in the last 8760 hours. No results for input(s): LIPASE, AMYLASE in the last 8760 hours. No results for input(s): AMMONIA in the last 8760 hours. CBC:  Recent Labs  10/08/14 1455  10/10/14 0712 10/11/14 0558 10/22/14 0020    WBC 9.5  < > 11.1* 10.7* 9.8  NEUTROABS 7.8*  --   --   --  7.5  HGB 13.9  < > 11.1* 9.6* 9.5*  HCT 42.9  < > 33.8* 30.1* 30.2*  MCV 91.7  < > 90.6 91.8 89.6  PLT 228  < > 206 182 771*  < > = values in this interval not displayed.  Assessment/Plan  1. Acute blood loss anemia Will need further follow up by PCP   2. 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 HYPERTENSION, BENIGN Blood pressure stable   4. Anxiety state Stable, will cont  zoloft   5. Urinary tract infection without hematuria, site unspecified Pt was positive for UTI during rehab with burning on urination. This has improved since on Cipro, prescription given to complete course   6. DVT (deep venous thrombosis), right pts right lower ext duplex venous US was positive for DVT done on 10/18/14. Pt is currently not on any anticoagulation for this at this time. Pt does not complain of pain or swelling in leg. This has actually improved. Will start pt on xarelto 15 mg twice daily for 21 days then to start xarelto 20 mg daily   7. Hip fracture, right, closed, with routine healing, subsequent encounter pt is stable for discharge-will need PT/OT/HHA per home health.  DME needed and written for. Rx written.  will need to follow up with PCP within 2 weeks.

## 2014-11-21 ENCOUNTER — Telehealth: Payer: Self-pay | Admitting: *Deleted

## 2014-11-21 ENCOUNTER — Ambulatory Visit: Payer: Medicare Other | Attending: Orthopedic Surgery

## 2014-11-21 DIAGNOSIS — T8484XA Pain due to internal orthopedic prosthetic devices, implants and grafts, initial encounter: Secondary | ICD-10-CM | POA: Insufficient documentation

## 2014-11-21 DIAGNOSIS — Z96649 Presence of unspecified artificial hip joint: Secondary | ICD-10-CM

## 2014-11-21 NOTE — Patient Instructions (Signed)
Heel Slide   Bend knee and pull heel toward buttocks.  Return. Repeat with other knee. Repeat __15__ times. Do 2 sets .  Do _3___ sessions per day.  http://[email or web address].exer.us/371   Abduction  Slide one leg out to side. Keep kneecap pointing up. Gently bring leg back to pillow. Repeat with other leg. Repeat _15___ times. So 2 sets. Do _3___ sessions per day.  Straight Leg Raise   Tighten stomach and slowly raise locked right leg _6-12___ inches from floor. Repeat __10__ times per set. Do __2__ sets per session. Do __3__ sessions per day.      Long Arc Knee Extension  Sit with left knee bent as much as possible. Squeeze pelvic floor and hold. Straighten knee. Hold for _3__ seconds. Relax for _3__ seconds. Repeat _15__ times. Do 2 sets.  Do _3__ times a day. Repeat with other leg.    FLEXION: Sitting (Active)   Sit, both feet flat. Lift right knee toward ceiling.  Complete _2__ sets of _10__ repetitions. Perform __3_ sessions per day.  Copyright  VHI. All rights reserved.   Hip Precautions: Limit Hip Flexion   Do not bend forward at hips past _90___ degrees while standing, sitting or lying down.   Hip Precautions: No Adduction   Keep legs apart at all times. Do not cross legs whether standing, sitting or lying down. Use pillow to keep legs apart in bed.   Copyright  VHI. All rights reserved.   Hip Precautions: No Internal Rotation   Do not twist affected leg inward. Keep foot pointed forward or out to side. This also applies when lying in bed. May use towel roll to keep leg from rolling inward.   Copyright  VHI. All rights reserved.   FALL PRECAUTIONS:  Sneakers or non-slip socks. Nightlight at nighttime Open spaces No throw Use cane   Dollene Cleveland, PT, DPT 11/21/2014 11:44 AM Phone: [phone] Fax: [phone]

## 2014-11-21 NOTE — Telephone Encounter (Signed)
APPTS MADE AND PRINTED...TD 

## 2014-11-21 NOTE — Therapy (Signed)
Outpatient Rehabilitation [facility] [address] [place], [place], [address] Phone: [phone]   Fax:  [phone]  Physical Therapy Evaluation  Patient Details  Name: Christopher Summers MRN: [id number] Date of Birth: 1941-04-08  Encounter Date: 11/21/2014      PT End of Session - 11/21/14 1708    Visit Number 1   Number of Visits 12   Date for PT Re-Evaluation 01/01/15   PT Start Time 1100   PT Stop Time 1145   PT Time Calculation (min) 45 min   Activity Tolerance Patient tolerated treatment well      Past Medical History  Diagnosis Date  . BPH (benign prostatic hypertrophy)   . Depression   . Essential hypertension, benign   . Reflux   . Dementia     Past Surgical History  Procedure Laterality Date  . Transurethral resection of prostate    . Hip arthroplasty Right 10/09/2014    Procedure: Right Hemiarthroplasty;  Surgeon: Rozanna Box, MD;  Location: [place];  Service: Orthopedics;  Laterality: Right;    There were no vitals taken for this visit.  Visit Diagnosis:  Pain with hip hemiarthroplasty, initial encounter      Subjective Assessment - 11/21/14 1112    Symptoms Pt is s/p R hip hemiarthroplasty on 10/09/14 by Dr Marcelino Scot following a fall with fx on 10/08/14. Pt attended [place] for rehad x 2 weeks, during which time he fell 3 times. Pt was referred to [facility] PT, but was not homebound by the time [facility] evaluated, so was referred to outpt PT.  Pt has Alzhemier's dementia. CG reports pt has some pain occasion, but pt has Alzhemier's so is unable to report pain accurately. Pt denies ever having pain. Pt unable to provide accurate subjective information due to dementia.    Pertinent History Alzhemier's,    Limitations Standing;Walking   Currently in Pain? Yes   Pain Score 0-No pain   Pain Location Hip   Pain Orientation Right          [facility] PT Assessment - 11/21/14 1116    Assessment   Medical Diagnosis R hip hemiarthroplasty   Onset Date  10/09/14   Next MD Visit Follow-up today   Prior Therapy 2 weeks in SNF, no HH    Precautions   Precautions Posterior Hip;Fall   Restrictions   Weight Bearing Restrictions Yes   RLE Weight Bearing Weight bearing as tolerated   Balance Screen   Has the patient fallen in the past 6 months Yes   How many times? 4   Has the patient had a decrease in activity level because of a fear of falling?  Yes   [place] Private residence   Living Arrangements Spouse/significant other   Home Access Stairs to enter   [place] One level   Prior Function   Level of Independence Needs assistance with ADLs;Needs assistance with homemaking  wife assists due to dementia   Vocation --  pt attends adult day- care daily.    Strength   Right Hip Flexion 3-/5   Right Hip ABduction 2+/5   Ambulation/Gait   Ambulation/Gait Yes   Ambulation/Gait Assistance 5: Supervision   Ambulation/Gait Assistance Details VCs to use cane   Ambulation Distance (Feet) 100 Feet   Assistive device Straight cane            PT Education - 11/21/14 1707    Education provided Yes   Education Details HEP, fall precautions,  posterior hip precautions, use cane for gait   Person(s) Educated Patient;Spouse   Methods Explanation;Demonstration;Tactile cues;Verbal cues;Handout   Comprehension Verbalized understanding;Returned demonstration;Tactile cues required;Verbal cues required;Need further instruction          PT Short Term Goals - 11/21/14 1712    PT SHORT TERM GOAL #1   Title "CG Independent with assisting pt with initial HEP   Time 2   Period Weeks   Status New          PT Long Term Goals - 11/21/14 1712    PT LONG TERM GOAL #1   Title "Pt will not c/o of pain with  functional activities   Time 6   Period Weeks   Status New   PT LONG TERM GOAL #2   Title Pt will return to adult day- care    Period Weeks   Status New   PT LONG TERM GOAL #3   Title TUG will improve to  15 secs to decrease risk for falls.    Time 6   Period Weeks   Status New   PT LONG TERM GOAL #4   Title R hip ABD improve to 3/5 MMT strength in order for pt to tolerate standing and walking for 30 mins.    Time 6   Period Weeks   Status New          Plan - 11/21/14 1709    Clinical Impression Statement Pt is a  73  year old male s/p  hemi Hip Arthroplasty on  10  / 27  /15 by Dr  Marcelino Scot . Pt presents with impairments including pain, weakness, decreased ROM, difficulty with walking, stairs, and with transfers . Pt would benefit from skilled PT for 2 times a week for 6 weeks to address above impariments and functional limitations and return to pain-free PLOF.   Pt will benefit from skilled therapeutic intervention in order to improve on the following deficits Abnormal gait;Difficulty walking;Decreased range of motion;Decreased mobility;Decreased strength;Pain;Decreased safety awareness;Decreased balance   Rehab Potential Excellent   PT Frequency 2x / week  CG may choose to come only 1 x a week due to high co-pay.    PT Duration 6 weeks   PT Treatment/Interventions ADLs/Self Care Home Management;Therapeutic activities;Therapeutic exercise;Patient/family education;Manual techniques;Neuromuscular re-education;Stair training;Gait training;Functional mobility training;Passive range of motion   PT Next Visit Plan Review/ progress HEP                               Problem List Patient Active Problem List   Diagnosis Date Noted  . Anxiety state 10/26/2014  . 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 Acute blood loss anemia 10/15/2014  . Hip fracture, right 10/08/2014  . Alzheimer's dementia with behavioral disturbance 10/08/2014  . Preoperative evaluation 10/08/2014  . Major depressive disorder, recurrent episode 10/05/2007  . HYPERTENSION, BENIGN 10/05/2007  . CYSTITIS, ACUTE 10/05/2007  . BENIGN PROSTATIC HYPERTROPHY, HX OF, S/P TURP 10/05/2007   Dollene Cleveland,  PT, DPT 11/21/2014 5:16 PM Phone: [phone] Fax: [phone]

## 2014-11-23 ENCOUNTER — Other Ambulatory Visit: Payer: Self-pay | Admitting: Internal Medicine

## 2014-11-27 ENCOUNTER — Ambulatory Visit: Payer: Medicare Other

## 2014-11-27 DIAGNOSIS — T8484XA Pain due to internal orthopedic prosthetic devices, implants and grafts, initial encounter: Secondary | ICD-10-CM | POA: Diagnosis not present

## 2014-11-27 DIAGNOSIS — T8484XD Pain due to internal orthopedic prosthetic devices, implants and grafts, subsequent encounter: Secondary | ICD-10-CM

## 2014-11-27 DIAGNOSIS — Z96649 Presence of unspecified artificial hip joint: Principal | ICD-10-CM

## 2014-11-27 NOTE — Therapy (Signed)
Outpatient Rehabilitation [facility] [address] [place], [place], [address] Phone: [phone]   Fax:  [phone]  Physical Therapy Treatment  Patient Details  Name: Christopher Summers MRN: [id number] Date of Birth: December 30, 1940  Encounter Date: 11/27/2014      PT End of Session - 11/27/14 1458    Visit Number 2   Number of Visits 12   Date for PT Re-Evaluation 01/01/15   PT Start Time 0215   PT Stop Time 0300   PT Time Calculation (min) 45 min   Activity Tolerance Patient tolerated treatment well      Past Medical History  Diagnosis Date  . BPH (benign prostatic hypertrophy)   . Depression   . Essential hypertension, benign   . Reflux   . Dementia     Past Surgical History  Procedure Laterality Date  . Transurethral resection of prostate    . Hip arthroplasty Right 10/09/2014    Procedure: Right Hemiarthroplasty;  Surgeon: Rozanna Box, MD;  Location: [place];  Service: Orthopedics;  Laterality: Right;    There were no vitals taken for this visit.  Visit Diagnosis:  Pain with hip hemiarthroplasty, subsequent encounter      Subjective Assessment - 11/27/14 1419    Symptoms Pt continues to co of pain, per CG report. CG reports she was able to instruct pt through HEP 2 times a day.    Pertinent History Alzhemier's,    Limitations Standing;Walking   Currently in Pain? Yes   Pain Score 0-No pain   Pain Location Hip   Pain Orientation Right            [facility] Adult PT Treatment/Exercise - 11/27/14 1422    Balance   Balance Assessed --  Feet together EO.EC, mod tandem head rot, EO/EC   Exercises   Exercises Knee/Hip   Knee/Hip Exercises: Standing   Heel Raises 15 reps   Other Standing Knee Exercises marches, sit<>stand, hip ABD, hip Ext, HSC 15 x each    Knee/Hip Exercises: Seated   Long Arc Quad AROM;10 reps   Other Seated Knee Exercises marches 20 x    Knee/Hip Exercises: Supine   Short Arc Quad Sets Strengthening;20 reps   Short  Arc Quad Sets Limitations 4#   Heel Slides AROM;20 reps   Straight Leg Raises AROM;2 sets;10 reps   Other Supine Knee Exercises hip ABDuction 10 x 2          PT Education - 11/27/14 1457    Education provided Yes   Education Details HEP   Person(s) Educated Patient;Spouse   Methods Explanation;Demonstration;Verbal cues;Handout;Tactile cues   Comprehension Verbalized understanding;Returned demonstration;Need further instruction;Verbal cues required;Tactile cues required              Plan - 11/27/14 1458    Clinical Impression Statement Compliance with HP. Pt reportquire constant VCs for proper technique during ther ex. Pt verbalized mm fatigue of R hip after 10 reps SLR and hip ABD.    Pt will benefit from skilled therapeutic intervention in order to improve on the following deficits Abnormal gait;Difficulty walking;Decreased range of motion;Decreased mobility;Decreased strength;Pain;Decreased safety awareness;Decreased balance   PT Treatment/Interventions ADLs/Self Care Home Management;Therapeutic activities;Therapeutic exercise;Patient/family education;Manual techniques;Neuromuscular re-education;Stair training;Gait training;Functional mobility training;Passive range of motion   PT Next Visit Plan Review/ progress HEP    Consulted and Agree with Plan of Care Patient  Problem List Patient Active Problem List   Diagnosis Date Noted  . Anxiety state 10/26/2014  . 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 Acute blood loss anemia 10/15/2014  . Hip fracture, right 10/08/2014  . Alzheimer's dementia with behavioral disturbance 10/08/2014  . Preoperative evaluation 10/08/2014  . Major depressive disorder, recurrent episode 10/05/2007  . HYPERTENSION, BENIGN 10/05/2007  . CYSTITIS, ACUTE 10/05/2007  . BENIGN PROSTATIC HYPERTROPHY, HX OF, S/P TURP 10/05/2007   Dollene Cleveland, PT, DPT 11/27/2014 3:01 PM Phone:  [phone] Fax: [phone]

## 2014-11-27 NOTE — Patient Instructions (Signed)
Knee High  Holding stable object, raise knee to hip level, then lower knee. Repeat with other knee. Repeat _10__ times. Do __2__ sessions per day.  ABDUCTION: Standing (Active)   Stand, feet flat. Lift right leg out to side.  Complete _1_ sets of _10__ repetitions. Perform _2__ sessions per day.  FLEXION: Standing - Stable (Active)  Stand, both feet flat. Bend right knee, bringing heel toward buttocks.  Complete  _10__ repetitions. Perform _2  sessions per day.  Mini-Squats (Standing)   Mini Squats: sit to stands: 10 x each. sit into chair and stand back up.    Heel Raises  Stand with support. Tighten pelvic floor and hold. With knees straight, raise heels off ground.  Repeat _10__ times. Do _2__ times a day.  EXTENSION: Standing (Active)  Stand, both feet flat. Draw right leg behind body as far as possible. Complete _1__ sets of __10_ repetitions. Perform _2__ sessions per day.  Copyright  VHI. All rights reserved.   Short Arc [employer] a large can or rolled towel under left leg. Straighten leg. Hold __3__ seconds. Repeat __10__ times. Do __2__ sessions per day.  http://gt2.exer.us/298   Copyright  VHI. All rights reserved.

## 2014-11-29 ENCOUNTER — Ambulatory Visit: Payer: Medicare Other

## 2014-11-29 DIAGNOSIS — Z96649 Presence of unspecified artificial hip joint: Principal | ICD-10-CM

## 2014-11-29 DIAGNOSIS — T8484XA Pain due to internal orthopedic prosthetic devices, implants and grafts, initial encounter: Secondary | ICD-10-CM | POA: Diagnosis not present

## 2014-11-29 DIAGNOSIS — T8484XD Pain due to internal orthopedic prosthetic devices, implants and grafts, subsequent encounter: Secondary | ICD-10-CM

## 2014-11-29 NOTE — Therapy (Signed)
Outpatient Rehabilitation [facility] [address] [place], [place], [address] Phone: [phone]   Fax:  [phone]  Physical Therapy Treatment  Patient Details  Name: Christopher Summers MRN: [id number] Date of Birth: 06-28-1941  Encounter Date: 11/29/2014      PT End of Session - 11/29/14 1012    Visit Number 3   Number of Visits 12   Date for PT Re-Evaluation 01/01/15   PT Start Time 0930   PT Stop Time 1015   PT Time Calculation (min) 45 min   Activity Tolerance Patient tolerated treatment well      Past Medical History  Diagnosis Date  . BPH (benign prostatic hypertrophy)   . Depression   . Essential hypertension, benign   . Reflux   . Dementia     Past Surgical History  Procedure Laterality Date  . Transurethral resection of prostate    . Hip arthroplasty Right 10/09/2014    Procedure: Right Hemiarthroplasty;  Surgeon: Rozanna Box, MD;  Location: [place];  Service: Orthopedics;  Laterality: Right;    There were no vitals taken for this visit.  Visit Diagnosis:  Pain with hip hemiarthroplasty, subsequent encounter      Subjective Assessment - 11/29/14 0944    Symptoms No c/o of pain. CG sees good improvement since beginning PT with imrpovements in gait.    Pertinent History Alzhemier's,    Currently in Pain? Yes   Pain Score 0-No pain   Pain Location Hip   Pain Orientation Right            [facility] Adult PT Treatment/Exercise - 11/29/14 0950    Balance   Balance Assessed --  Feet together EO.EC, mod tandem head rot, EO/EC   Exercises   Exercises Knee/Hip   Knee/Hip Exercises: Stretches   Hip Flexor Stretch 3 reps;30 seconds   Knee/Hip Exercises: Aerobic   Stationary Bike NUSTEP 5 mins  maintainin hip precautions    Knee/Hip Exercises: Standing   Heel Raises --   Other Standing Knee Exercises --   Knee/Hip Exercises: Seated   Long Arc Quad Strengthening;20 reps;Weights   Long Arc Quad Weight 4 lbs.   Other Seated Knee  Exercises marches 20 x    Other Seated Knee Exercises Hip ADD/ABd pillow/ red, 20 x    Knee/Hip Exercises: Supine   Short Arc Quad Sets Strengthening;20 reps   Short Arc Quad Sets Limitations 4#, 20 x 2   Heel Slides AROM;20 reps   Bridges AROM;20 reps   Straight Leg Raises AROM;2 sets;10 reps;Other (comment)   Straight Leg Raises Limitations PT assist as needed.    Other Supine Knee Exercises hip ABDuction 10 x 2   Knee/Hip Exercises: Sidelying   Clams 10 x           PT Education - 11/29/14 1012    Education provided No              Plan - 11/29/14 1026    Clinical Impression Statement Continues to have difficulty/ weakness with SLR, hip flexion. Progressed with NUSTEP and hip flexor stretch. VCs for longer step on L LE during gait to promote hip ext on R during gait ( with CGA) . CG may want to decrease to once a week beginning week of Dec 27th/    Pt will benefit from skilled therapeutic intervention in order to improve on the following deficits Abnormal gait;Difficulty walking;Decreased range of motion;Decreased mobility;Decreased strength;Pain;Decreased safety awareness;Decreased balance   Rehab Potential Excellent  PT Next Visit Plan Review/ progress HEP . Progress to closed chain/ functional strengthening.    Consulted and Agree with Plan of Care Patient;Family member/caregiver                               Problem List Patient Active Problem List   Diagnosis Date Noted  . Anxiety state 10/26/2014  . 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 Acute blood loss anemia 10/15/2014  . Hip fracture, right 10/08/2014  . Alzheimer's dementia with behavioral disturbance 10/08/2014  . Preoperative evaluation 10/08/2014  . Major depressive disorder, recurrent episode 10/05/2007  . HYPERTENSION, BENIGN 10/05/2007  . CYSTITIS, ACUTE 10/05/2007  . BENIGN PROSTATIC HYPERTROPHY, HX OF, S/P TURP 10/05/2007    Dollene Cleveland, PT, DPT 11/29/2014 10:32  AM Phone: [phone] Fax: [phone]

## 2014-12-04 ENCOUNTER — Ambulatory Visit: Payer: Medicare Other | Admitting: Rehabilitation

## 2014-12-04 DIAGNOSIS — T8484XD Pain due to internal orthopedic prosthetic devices, implants and grafts, subsequent encounter: Secondary | ICD-10-CM

## 2014-12-04 DIAGNOSIS — T8484XA Pain due to internal orthopedic prosthetic devices, implants and grafts, initial encounter: Secondary | ICD-10-CM

## 2014-12-04 DIAGNOSIS — Z96649 Presence of unspecified artificial hip joint: Principal | ICD-10-CM

## 2014-12-04 NOTE — Therapy (Signed)
[place], [place], [address] Phone: [phone]   Fax:  [phone]  Physical Therapy Treatment  Patient Details  Name: Christopher Summers MRN: [id number] Date of Birth: 1941-01-08  Encounter Date: 12/04/2014      PT End of Session - 12/04/14 1650    Visit Number 4   Number of Visits 12   Date for PT Re-Evaluation 01/01/15   PT Start Time 0215   PT Stop Time 0300   PT Time Calculation (min) 45 min      Past Medical History  Diagnosis Date  . BPH (benign prostatic hypertrophy)   . Depression   . Essential hypertension, benign   . Reflux   . Dementia     Past Surgical History  Procedure Laterality Date  . Transurethral resection of prostate    . Hip arthroplasty Right 10/09/2014    Procedure: Right Hemiarthroplasty;  Surgeon: Rozanna Box, MD;  Location: [place];  Service: Orthopedics;  Laterality: Right;    There were no vitals taken for this visit.  Visit Diagnosis:  Pain with hip hemiarthroplasty, subsequent encounter  Pain with hip hemiarthroplasty, initial encounter      Subjective Assessment - 12/04/14 1418    Symptoms No c/o pain. CG reports he stated he was sore when doing HEP this morning.    Currently in Pain? No/denies                    [facility] Adult PT Treatment/Exercise - 12/04/14 1437    Ambulation/Gait   Stairs Yes   Stairs Assistance 6: Modified independent (Device/Increase time)   Stair Management Technique One rail Left;Alternating pattern   Number of Stairs 10   Height of Stairs 6   Knee/Hip Exercises: Stretches   Hip Flexor Stretch 3 reps;30 seconds  sidelying PROM   Knee/Hip Exercises: Aerobic   Stationary Bike Nustep level 6 x 5 minutes   Knee/Hip Exercises: Standing   Heel Raises 20 reps   Wall Squat 2 sets;10 reps   SLS 20 sec with 1 UE   SLS with Vectors 2x10 abdct, march and h/s curls bilateral   Knee/Hip Exercises: Supine   Short Arc  Music therapist Arc Quad Sets Limitations 5# 20x2   Straight Leg Raises AROM;Right;10 reps   Straight Leg Raises Limitations PT assist as needed.   c/o soreness   Knee Flexion AROM;10 reps   Other Supine Knee Exercises Supine March 10x2  Supine clam green band x10x2   Other Supine Knee Exercises Supine ball squeeze x10   Knee/Hip Exercises: Sidelying   Hip ABduction 3 sets;10 reps  with knee flexed   Clams 10x3       Problem List Patient Active Problem List   Diagnosis Date Noted  . Anxiety state 10/26/2014  . 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 Acute blood loss anemia 10/15/2014  . Hip fracture, right 10/08/2014  . Alzheimer's dementia with behavioral disturbance 10/08/2014  . Preoperative evaluation 10/08/2014  . Major depressive disorder, recurrent episode 10/05/2007  . HYPERTENSION, BENIGN 10/05/2007  . CYSTITIS, ACUTE 10/05/2007  . BENIGN PROSTATIC HYPERTROPHY, HX OF, S/P TURP 10/05/2007    Dorene Ar, PTA 12/04/2014, 4:51 PM  [facility] [address] [place], [place], [address] Phone: [phone]   Fax:  [phone]

## 2014-12-11 ENCOUNTER — Ambulatory Visit: Payer: Medicare Other | Admitting: Rehabilitation

## 2014-12-11 DIAGNOSIS — Z96649 Presence of unspecified artificial hip joint: Principal | ICD-10-CM

## 2014-12-11 DIAGNOSIS — T8484XD Pain due to internal orthopedic prosthetic devices, implants and grafts, subsequent encounter: Secondary | ICD-10-CM

## 2014-12-11 DIAGNOSIS — T8484XA Pain due to internal orthopedic prosthetic devices, implants and grafts, initial encounter: Secondary | ICD-10-CM

## 2014-12-11 NOTE — Therapy (Signed)
[place], [place], [address] Phone: [phone]   Fax:  [phone]  Physical Therapy Treatment  Patient Details  Name: Christopher Summers MRN: [id number] Date of Birth: Jul 26, 1941  Encounter Date: 12/11/2014    Past Medical History  Diagnosis Date  . BPH (benign prostatic hypertrophy)   . Depression   . Essential hypertension, benign   . Reflux   . Dementia     Past Surgical History  Procedure Laterality Date  . Transurethral resection of prostate    . Hip arthroplasty Right 10/09/2014    Procedure: Right Hemiarthroplasty;  Surgeon: Rozanna Box, MD;  Location: [place];  Service: Orthopedics;  Laterality: Right;    There were no vitals taken for this visit.  Visit Diagnosis:  Pain with hip hemiarthroplasty, subsequent encounter  Pain with hip hemiarthroplasty, initial encounter      Subjective Assessment - 12/11/14 1423    Symptoms no c/o pain, Care giver reports they went for 15 min walk on two different days. The patient c/o increased thigh/hip pain so they discontinued the walk.   Limitations Walking   Currently in Pain? No/denies          [facility] PT Assessment - 12/11/14 1500    Strength   Right Hip Flexion 3+/5   Right Hip ABduction 3/5           [facility] Adult PT Treatment/Exercise - 12/11/14 0001    Knee/Hip Exercises: Aerobic   Stationary Bike Nustep level 6 x 5 minutes   Knee/Hip Exercises: Standing   SLS with Vectors adduction, bilateral 10x3, marches also   Knee/Hip Exercises: Seated   Other Seated Knee Exercises seated marches and clams with green band issued for HEP   Knee/Hip Exercises: Supine   Heel Slides 2 sets;10 reps;Right   Straight Leg Raises 2 sets;10 reps  1 set with knee bent   Knee/Hip Exercises: Sidelying   Hip ABduction 3 sets;10 reps  with leg on pillow   Clams 10x3                  PT Short Term Goals - 12/04/14 1649    PT SHORT TERM  GOAL #1   Title "CG Independent with assisting pt with initial HEP   Time 2   Period Weeks   Status Achieved           PT Long Term Goals - 12/04/14 1649    PT LONG TERM GOAL #1   Title "Pt will not c/o of pain with  functional activities   Time 6   Period Weeks   Status Achieved   PT LONG TERM GOAL #2   Title Pt will return to adult day- care    Period Weeks   Status On-going   PT LONG TERM GOAL #3   Title TUG will improve to 15 secs to decrease risk for falls.    Time 6   Period Weeks   Status On-going   PT LONG TERM GOAL #4   Title R hip ABD improve to 3/5 MMT strength in order for pt to tolerate standing and walking for 30 mins.    Time 6   Period Weeks   Status On-going               Plan - 12/11/14 1501    Clinical [place] reports patient sees MD next week and he will determine if pt can return to adult day  care for dementia. She reports difficulty with pt's compliance to HEP.  Right hip strength improved. Pt continues with moderate gait deviations due to hip weakness and complains of increased right thigh  pain with prolonged walking or increased  R SLS activities or SLRs   PT Next Visit Plan continue closed chain and mat exercises to increase hip strength, check TUG        Problem List Patient Active Problem List   Diagnosis Date Noted  . Anxiety state 10/26/2014  . 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 Acute blood loss anemia 10/15/2014  . Hip fracture, right 10/08/2014  . Alzheimer's dementia with behavioral disturbance 10/08/2014  . Preoperative evaluation 10/08/2014  . Major depressive disorder, recurrent episode 10/05/2007  . HYPERTENSION, BENIGN 10/05/2007  . CYSTITIS, ACUTE 10/05/2007  . BENIGN PROSTATIC HYPERTROPHY, HX OF, S/P TURP 10/05/2007    Dorene Ar, PTA 12/11/2014, 3:23 PM  [facility] [address] [place], [place], [address] Phone:  [phone]   Fax:  [phone]

## 2014-12-13 ENCOUNTER — Encounter: Payer: Self-pay | Admitting: Physical Therapy

## 2014-12-13 ENCOUNTER — Ambulatory Visit: Payer: Medicare Other | Admitting: Physical Therapy

## 2014-12-13 DIAGNOSIS — T8484XA Pain due to internal orthopedic prosthetic devices, implants and grafts, initial encounter: Secondary | ICD-10-CM | POA: Diagnosis not present

## 2014-12-13 DIAGNOSIS — T8484XD Pain due to internal orthopedic prosthetic devices, implants and grafts, subsequent encounter: Secondary | ICD-10-CM

## 2014-12-13 DIAGNOSIS — Z96649 Presence of unspecified artificial hip joint: Principal | ICD-10-CM

## 2014-12-13 NOTE — Therapy (Signed)
[facility] [address] [place], [place], [address] Phone: [phone]   Fax:  [phone]  Physical Therapy Treatment  Patient Details  Name: Christopher Summers MRN: [id number] Date of Birth: 06/08/1941  Encounter Date: 12/13/2014      PT End of Session - 12/13/14 1424    Visit Number 5   Number of Visits 12   Date for PT Re-Evaluation 01/01/15   PT Start Time 1332   PT Stop Time 1420   PT Time Calculation (min) 48 min   Activity Tolerance Patient tolerated treatment well   Behavior During Therapy --  Max verbal cueing due to cognition, cooperative      Past Medical History  Diagnosis Date  . BPH (benign prostatic hypertrophy)   . Depression   . Essential hypertension, benign   . Reflux   . Dementia     Past Surgical History  Procedure Laterality Date  . Transurethral resection of prostate    . Hip arthroplasty Right 10/09/2014    Procedure: Right Hemiarthroplasty;  Surgeon: Michael H Handy, MD;  [facility];  Service: Orthopedics;  Laterality: Right;    There were no vitals taken for this visit.  Visit Diagnosis:  Pain with hip hemiarthroplasty, subsequent encounter  Pain with hip hemiarthroplasty, initial encounter      Subjective Assessment - 12/13/14 1337    Symptoms Similar complaints as previous from spouse today, he c/o tiredness in R LE with activity.  Needs encouragement to do HEP.     Pertinent History Alzhemier's,    Limitations Walking   Currently in Pain? No/denies          [facility] PT Assessment - 12/13/14 1423    Strength   Right Hip Flexion 3+/5   Right Hip ABduction 3/5   Timed Up and Go Test   Normal TUG (seconds) 11   TUG Comments 3 trials: 15, 9, 10          [facility] Adult PT Treatment/Exercise - 12/13/14 1344    Knee/Hip Exercises: Aerobic   Stationary Bike Nustep level 7 x 5 minutes, LE only   Knee/Hip Exercises: Standing   Heel Raises 20 reps   SLS with Vectors --   abd, flexion and ext of hip x10 each both LEs, cue for post.   Other Standing Knee Exercises sit to stand no UE x 10   Knee/Hip Exercises: Seated   Long Arc Quad Strengthening;Left;2 sets;Weights   Long Arc Quad Weight --  2   Other Seated Knee Exercises seated march with 2 lbs x 20   Other Seated Knee Exercises clam with red band x 20   Knee/Hip Exercises: Supine   Bridges Strengthening;2 sets   Bridges Limitations --  ball squeeze x10   Straight Leg Raises Strengthening;Right;2 sets   Straight Leg Raise with External Rotation Strengthening;Right;1 set   Knee/Hip Exercises: Sidelying   Hip ABduction Strengthening;Both;2 sets;20 reps   Clams x20   Other Sidelying Knee Exercises --  hip abd with sl knee flexion x 10 each, manual A to maintain   Other Sidelying Knee Exercises manual A to maintain trunk stab.                 PT Education - 12/13/14 1423    Education provided Yes   Education Details plan to continue PT past MD visit   Person(s) Educated Patient;Spouse   Methods Explanation   Comprehension Verbalized understanding            PT Short Term Goals - 12/04/14 1649    PT SHORT TERM GOAL #1   Title "CG Independent with assisting pt with initial HEP   Time 2   Period Weeks   Status Achieved           PT Long Term Goals - 12/13/14 1428    PT LONG TERM GOAL #1   Title "Pt will not c/o of pain with  functional activities   Status Achieved   PT LONG TERM GOAL #2   Title Pt will return to adult day- care    Status On-going   PT LONG TERM GOAL #3   Title TUG will improve to 15 secs to decrease risk for falls.    Status Achieved   PT LONG TERM GOAL #4   Title R hip ABD improve to 3/5 MMT strength in order for pt to tolerate standing and walking for 30 mins.    Status On-going               Plan - 12/13/14 1426    Clinical Impression Statement Patient will likely continue after MD visit due to progress made and pt. lack of motivation at home.   Met TUG goal, may have done even better but cognition of task slowed transition to sit.    Pt will benefit from skilled therapeutic intervention in order to improve on the following deficits Abnormal gait;Difficulty walking;Decreased range of motion;Decreased mobility;Decreased strength;Pain;Decreased safety awareness;Decreased balance   Rehab Potential Excellent   PT Frequency 2x / week   PT Duration 6 weeks   PT Treatment/Interventions ADLs/Self Care Home Management;Therapeutic activities;Therapeutic exercise;Patient/family education;Manual techniques;Neuromuscular re-education;Stair training;Gait training;Functional mobility training;Passive range of motion   PT Next Visit Plan continue closed chain and mat exercises to increase hip strength   PT Home Exercise Plan cont as previous   Consulted and Agree with Plan of Care Patient;Family member/caregiver   Family Member Consulted Spouse        Problem List Patient Active Problem List   Diagnosis Date Noted  . Anxiety state 10/26/2014  . 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 Acute blood loss anemia 10/15/2014  . Hip fracture, right 10/08/2014  . Alzheimer's dementia with behavioral disturbance 10/08/2014  . Preoperative evaluation 10/08/2014  . Major depressive disorder, recurrent episode 10/05/2007  . HYPERTENSION, BENIGN 10/05/2007  . CYSTITIS, ACUTE 10/05/2007  . BENIGN PROSTATIC HYPERTROPHY, HX OF, S/P TURP 10/05/2007    PAA,JENNIFER 12/13/2014, 2:36 PM  [facility] [address] [place], [place], [address] Phone: [phone]   Fax:  [phone]    Raeford Razor, PT 12/13/2014 2:37 PM Phone: [phone] Fax: [phone]

## 2014-12-18 ENCOUNTER — Ambulatory Visit: Payer: Medicare Other | Attending: Orthopedic Surgery | Admitting: Rehabilitation

## 2014-12-18 DIAGNOSIS — Z96649 Presence of unspecified artificial hip joint: Secondary | ICD-10-CM

## 2014-12-18 DIAGNOSIS — T8484XA Pain due to internal orthopedic prosthetic devices, implants and grafts, initial encounter: Secondary | ICD-10-CM | POA: Diagnosis not present

## 2014-12-18 DIAGNOSIS — T8484XD Pain due to internal orthopedic prosthetic devices, implants and grafts, subsequent encounter: Secondary | ICD-10-CM

## 2014-12-18 NOTE — Therapy (Signed)
[place], [place], [address] Phone: [phone]   Fax:  [phone]  Physical Therapy Treatment  Patient Details  Name: Christopher Summers MRN: [id number] Date of Birth: 27-Sep-1941  Encounter Date: 12/18/2014      PT End of Session - 12/18/14 1424    Visit Number 6   Number of Visits 12   Date for PT Re-Evaluation 01/01/15   PT Start Time 0215   PT Stop Time 0300   PT Time Calculation (min) 45 min      Past Medical History  Diagnosis Date  . BPH (benign prostatic hypertrophy)   . Depression   . Essential hypertension, benign   . Reflux   . Dementia     Past Surgical History  Procedure Laterality Date  . Transurethral resection of prostate    . Hip arthroplasty Right 10/09/2014    Procedure: Right Hemiarthroplasty;  Surgeon: Rozanna Box, MD;  Location: [facility];  Service: Orthopedics;  Laterality: Right;    There were no vitals taken for this visit.  Visit Diagnosis:  Pain with hip hemiarthroplasty, subsequent encounter  Pain with hip hemiarthroplasty, initial encounter      Subjective Assessment - 12/18/14 1421    Symptoms Care giver reports improved gait  distance tolerance and incline 2 days ago before increased pain >/= 20 minutes   Pertinent History Alzhemier's,    Limitations Walking   Currently in Pain? No/denies          [facility] PT Assessment - 12/18/14 0001    Strength   Right Hip Flexion 3+/5  pain   Right Hip ABduction 3+/5   Right Knee Flexion 4/5   Right Knee Extension --  4-/5                  [facility] Adult PT Treatment/Exercise - 12/18/14 0001    Ambulation/Gait   Stairs Yes   Stairs Assistance 6: Modified independent (Device/Increase time)   Stair Management Technique One rail Left;Alternating pattern   Knee/Hip Exercises: Aerobic   Stationary Bike Nustep level 7 x 7 minutes, LE only   Knee/Hip Exercises: Standing   Knee Flexion 2 sets;10 reps  3#    Forward Step Up --  2 steps ups with c/o medial rt knee pain so dc'd   SLS 5   SLS with Vectors --  abd, flexion and ext of hip x10 each both LEs, cue for post.   Other Standing Knee Exercises sit to stand no UE x 15   Knee/Hip Exercises: Seated   Long Arc Quad Strengthening;Left;2 sets;Weights   Long Arc Quad Weight --  3# with c/o anterior knee pain   Other Seated Knee Exercises seated march with 2 lbs x 20   Knee/Hip Exercises: Supine   Bridges [employer] Limitations --  ball squeeze x10, bridge with marches   Straight Leg Raises Strengthening;Right;2 sets;1 set;5 reps;10 reps  SLR independently 6 times no Assist   Other Supine Knee Exercises Supine March 10x2  Supine clam blue band x10x2   Other Supine Knee Exercises Supine Marching blue band x20   Knee/Hip Exercises: Sidelying   Hip ABduction Strengthening;Both;2 sets;20 reps   Clams x20                  PT Short Term Goals - 12/04/14 1649    PT SHORT TERM GOAL #1   Title "CG Independent with assisting pt with initial HEP  Time 2   Period Weeks   Status Achieved           PT Long Term Goals - 12/18/14 1425    PT LONG TERM GOAL #1   Title "Pt will not c/o of pain with  functional activities   Time 6   Period Weeks   Status On-going   PT LONG TERM GOAL #2   Title Pt will return to adult day- care    Period Weeks   Status On-going   PT LONG TERM GOAL #3   Title TUG will improve to 15 secs to decrease risk for falls.    Time 6   Period Weeks   Status Achieved   PT LONG TERM GOAL #4   Title R hip ABD improve to 3/5 MMT strength in order for pt to tolerate standing and walking for 30 mins.    Status Partially Met  Strength improved, gait improved to 20 minutes               Plan - 12/18/14 1454    Clinical Impression Statement pt continues to demonstrate right hip/knee weakness and antalgic gait. Making progress toward goals. Pt c/o knee pain today for the first time  while doing step ups. Able to complete 6 SLRs before increased  rt thigh pain today.    PT Next Visit Plan continue closed chain and mat exercises to increase hip strength, knee still painful?   Consulted and Agree with Plan of Care Family member/caregiver;Patient        Problem List Patient Active Problem List   Diagnosis Date Noted  . Anxiety state 10/26/2014  . 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 Acute blood loss anemia 10/15/2014  . Hip fracture, right 10/08/2014  . Alzheimer's dementia with behavioral disturbance 10/08/2014  . Preoperative evaluation 10/08/2014  . Major depressive disorder, recurrent episode 10/05/2007  . HYPERTENSION, BENIGN 10/05/2007  . CYSTITIS, ACUTE 10/05/2007  . BENIGN PROSTATIC HYPERTROPHY, HX OF, S/P TURP 10/05/2007    Dorene Ar, PTA 12/18/2014, 3:13 PM  [facility] [address] [place], [place], [address] Phone: [phone]   Fax:  [phone]

## 2014-12-19 DIAGNOSIS — S72001D Fracture of unspecified part of neck of right femur, subsequent encounter for closed fracture with routine healing: Secondary | ICD-10-CM | POA: Diagnosis not present

## 2014-12-25 ENCOUNTER — Ambulatory Visit: Payer: Medicare Other | Admitting: Rehabilitation

## 2014-12-25 DIAGNOSIS — Z96649 Presence of unspecified artificial hip joint: Principal | ICD-10-CM

## 2014-12-25 DIAGNOSIS — T8484XD Pain due to internal orthopedic prosthetic devices, implants and grafts, subsequent encounter: Secondary | ICD-10-CM

## 2014-12-25 DIAGNOSIS — T8484XA Pain due to internal orthopedic prosthetic devices, implants and grafts, initial encounter: Secondary | ICD-10-CM

## 2014-12-25 NOTE — Therapy (Signed)
[place], [place], [address] Phone: [phone]   Fax:  [phone]  Physical Therapy Treatment  Patient Details  Name: Christopher Summers MRN: [id number] Date of Birth: 1941-07-25 Referring Provider:  Hennie Duos, MD  Encounter Date: 12/25/2014      PT End of Session - 12/25/14 1417    Visit Number 7   Number of Visits 12   Date for PT Re-Evaluation 01/01/15   PT Start Time 0130   PT Stop Time 0213   PT Time Calculation (min) 43 min      Past Medical History  Diagnosis Date  . BPH (benign prostatic hypertrophy)   . Depression   . Essential hypertension, benign   . Reflux   . Dementia     Past Surgical History  Procedure Laterality Date  . Transurethral resection of prostate    . Hip arthroplasty Right 10/09/2014    Procedure: Right Hemiarthroplasty;  Surgeon: Rozanna Box, MD;  Location: [facility];  Service: Orthopedics;  Laterality: Right;    There were no vitals taken for this visit.  Visit Diagnosis:  Pain with hip hemiarthroplasty, subsequent encounter  Pain with hip hemiarthroplasty, initial encounter      Subjective Assessment - 12/25/14 1416    Symptoms Antalgic gait today with wife reporting he c/o pain posterior hip for several days after last visit.    Pertinent History Alzhemier's,    Limitations Walking   Currently in Pain? Yes  unable to rate however reports posterior hip pain with [facility] Adult PT Treatment/Exercise - 12/25/14 1345    Knee/Hip Exercises: Stretches   Passive Hamstring Stretch 1 rep;60 seconds   Knee/Hip Exercises: Standing   Knee Flexion 2 sets;10 reps  3#   SLS 7 best   Other Standing Knee Exercises sit to stand no UE x 10   Knee/Hip Exercises: Seated   Long Arc Quad Strengthening;2 sets;10 reps   Long Arc Quad Weight --  3#   Other Seated Knee Exercises seated march with 3 lbs x 20   Knee/Hip  Exercises: Supine   Short Arc Quad Sets Right;10 reps;3 sets  3#   Bridges 1 set;10 reps  with ball squeeze x 10   Straight Leg Raises Right;2 sets;10 reps  no c/o pain today   Other Supine Knee Exercises ball squeeze x 10   Other Supine Knee Exercises clam with green band x20, march with green band x 20    Knee/Hip Exercises: Sidelying   Hip ABduction Strengthening;Right;2 sets;10 reps   Clams x10, x20 with 3 #                   PT Short Term Goals - 12/04/14 1649    PT SHORT TERM GOAL #1   Title "CG Independent with assisting pt with initial HEP   Time 2   Period Weeks   Status Achieved           PT Long Term Goals - 12/25/14 1417    PT LONG TERM GOAL #1   Title "Pt will not c/o of pain with  functional activities   Time 6   Period Weeks   Status On-going   PT LONG TERM GOAL #2   Title Pt will return to adult day- care    Time 6   Period Weeks  Status Achieved   PT LONG TERM GOAL #3   Title TUG will improve to 15 secs to decrease risk for falls.    Time 6   Period Weeks   Status Achieved   PT LONG TERM GOAL #4   Title R hip ABD improve to 3/5 MMT strength in order for pt to tolerate standing and walking for 30 mins.    Time 6   Period Weeks   Status Partially Met  gait improved to 20 min, have not walked due to cold weather               Plan - 12/25/14 1419    Clinical Impression Statement MD released pt to return to adult day care. He has attended 4 hour days x 2 without difficulty per wife.  LTG# 2 met, more low level exercises today due to wife reports of increased pain after last visit.    PT Next Visit Plan continue closed chain and mat exercises to increase hip strength, knee/ hip still painful?         Problem List Patient Active Problem List   Diagnosis Date Noted  . Anxiety state 10/26/2014  . 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 Acute blood loss anemia 10/15/2014  . Hip fracture, right 10/08/2014  . Alzheimer's  dementia with behavioral disturbance 10/08/2014  . Preoperative evaluation 10/08/2014  . Major depressive disorder, recurrent episode 10/05/2007  . HYPERTENSION, BENIGN 10/05/2007  . CYSTITIS, ACUTE 10/05/2007  . BENIGN PROSTATIC HYPERTROPHY, HX OF, S/P TURP 10/05/2007    Dorene Ar, PTA 12/25/2014, 2:26 PM  [facility] [address] [place], [place], [address] Phone: [phone]   Fax:  [phone]

## 2014-12-27 ENCOUNTER — Ambulatory Visit: Payer: Medicare Other | Admitting: Physical Therapy

## 2014-12-27 DIAGNOSIS — T8484XA Pain due to internal orthopedic prosthetic devices, implants and grafts, initial encounter: Secondary | ICD-10-CM

## 2014-12-27 DIAGNOSIS — Z96649 Presence of unspecified artificial hip joint: Principal | ICD-10-CM

## 2014-12-27 DIAGNOSIS — T8484XD Pain due to internal orthopedic prosthetic devices, implants and grafts, subsequent encounter: Secondary | ICD-10-CM

## 2014-12-27 NOTE — Therapy (Signed)
[place], [place], [address] Phone: [phone]   Fax:  [phone]  Physical Therapy Treatment/Re-certification Patient Details  Name: Christopher Summers MRN: [id number] Date of Birth: 10/26/1941 Referring Provider:  Hennie Duos, MD  Encounter Date: 12/27/2014      PT End of Session - 12/27/14 1113    Visit Number 8   Number of Visits 16   Date for PT Re-Evaluation 02/12/15   PT Start Time 1020   PT Stop Time 1102   PT Time Calculation (min) 42 min   Activity Tolerance Patient tolerated treatment well      Past Medical History  Diagnosis Date  . BPH (benign prostatic hypertrophy)   . Depression   . Essential hypertension, benign   . Reflux   . Dementia     Past Surgical History  Procedure Laterality Date  . Transurethral resection of prostate    . Hip arthroplasty Right 10/09/2014    Procedure: Right Hemiarthroplasty;  Surgeon: Rozanna Box, MD;  Location: [facility];  Service: Orthopedics;  Laterality: Right;    There were no vitals taken for this visit.  Visit Diagnosis:  Pain with hip hemiarthroplasty, subsequent encounter  Pain with hip hemiarthroplasty, initial encounter      Subjective Assessment - 12/27/14 1027    Symptoms Offered no complaints, continues to limp in to clinic with AD.    Limitations Walking   Currently in Pain? No/denies  wife says he had a little pain post last session   Pain Orientation Right          [facility] PT Assessment - 12/27/14 1109    Strength   Right Hip Flexion 3+/5  pain   Right Hip Extension 4/5   Right Hip ABduction 3+/5   Right Knee Flexion 4+/5   Right Knee Extension --  4-/5                  [facility] Adult PT Treatment/Exercise - 12/27/14 1037    Knee/Hip Exercises: Stretches   Passive Hamstring Stretch 1 rep;60 seconds   Knee/Hip Exercises: Aerobic   Stationary Bike NuStep level 5, 9 min   Knee/Hip Exercises: Machines  for Strengthening   Cybex Leg Press --  1 plate x 25, 2 plates x 20   Knee/Hip Exercises: Standing   Functional Squat 1 set;15 reps   Functional Squat Limitations --  on foam   SLS with Vectors --  hip abd on foam x 20 each    Other Standing Knee Exercises On foam:,march  x10 each with no UE assist   Knee/Hip Exercises: Supine   Bridges Strengthening;2 sets;10 reps   Bridges Limitations 1 set with ball   Other Supine Knee Exercises lower trunk rotation x10            PT Education - 12/27/14 1111    Education provided Yes   Education Details POC, community ex options   Person(s) Educated Patient   Comprehension Verbalized understanding          PT Short Term Goals - 12/04/14 1649    PT SHORT TERM GOAL #1   Title "CG Independent with assisting pt with initial HEP   Time 2   Period Weeks   Status Achieved           PT Long Term Goals - 12/25/14 1417    PT LONG TERM GOAL #1   Title "Pt will not c/o of pain with  functional activities   Time 6   Period Weeks   Status On-going   PT LONG TERM GOAL #2   Title Pt will return to adult day- care    Time 6   Period Weeks   Status Achieved   PT LONG TERM GOAL #3   Title TUG will improve to 15 secs to decrease risk for falls.    Time 6   Period Weeks   Status Achieved   PT LONG TERM GOAL #4   Title R hip ABD improve to 3/5 MMT strength in order for pt to tolerate standing and walking for 30 mins.    Time 6   Period Weeks   Status Partially Met  gait improved to 20 min, have not walked due to cold weather               Plan - 12/27/14 1114    Clinical Impression Statement Patient making good progress towards goals. Spouse continues to verbalize his reluctance to do any activity when he is not in therapy.  Day care may offer a fitness option and spouse will follow up. He will be renewed today for continued strengthening and to normalize gait.     Pt will benefit from skilled therapeutic intervention in  order to improve on the following deficits Abnormal gait;Difficulty walking;Decreased range of motion;Decreased mobility;Decreased strength;Pain;Decreased safety awareness;Decreased balance   Rehab Potential Excellent   Clinical Impairments Affecting Rehab Potential Cognition   PT Frequency 1x / week  after next week (2 x week)   PT Treatment/Interventions ADLs/Self Care Home Management;Therapeutic activities;Therapeutic exercise;Patient/family education;Manual techniques;Neuromuscular re-education;Stair training;Gait training;Functional mobility training;Passive range of motion   PT Next Visit Plan continue with general hip.knee/core strength and balance   PT Home Exercise Plan cont as previous   Consulted and Agree with Plan of Care Family member/caregiver;Patient   Family Member Consulted Spouse        Problem List Patient Active Problem List   Diagnosis Date Noted  . Anxiety state 10/26/2014  . 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 Acute blood loss anemia 10/15/2014  . Hip fracture, right 10/08/2014  . Alzheimer's dementia with behavioral disturbance 10/08/2014  . Preoperative evaluation 10/08/2014  . Major depressive disorder, recurrent episode 10/05/2007  . HYPERTENSION, BENIGN 10/05/2007  . CYSTITIS, ACUTE 10/05/2007  . BENIGN PROSTATIC HYPERTROPHY, HX OF, S/P TURP 10/05/2007    PAA,JENNIFER 12/27/2014, 11:21 AM  [facility] [address] [place], [place], [address] Phone: [phone]   Fax:  [phone]   Raeford Razor, PT 12/27/2014 11:22 AM Phone: [phone] Fax: [phone]

## 2015-01-01 ENCOUNTER — Ambulatory Visit: Payer: Medicare Other | Admitting: Rehabilitation

## 2015-01-01 DIAGNOSIS — T8484XA Pain due to internal orthopedic prosthetic devices, implants and grafts, initial encounter: Secondary | ICD-10-CM | POA: Diagnosis not present

## 2015-01-01 DIAGNOSIS — Z96649 Presence of unspecified artificial hip joint: Principal | ICD-10-CM

## 2015-01-01 DIAGNOSIS — T8484XD Pain due to internal orthopedic prosthetic devices, implants and grafts, subsequent encounter: Secondary | ICD-10-CM

## 2015-01-01 NOTE — Therapy (Signed)
[place], [place], [address] Phone: [phone]   Fax:  [phone]  Physical Therapy Treatment  Patient Details  Name: Christopher Summers MRN: [id number] Date of Birth: [birth year] Referring Provider:  Hennie Duos, MD  Encounter Date: 01/01/2015      PT End of Session - 01/01/15 1342    Visit Number 9   Number of Visits 16   Date for PT Re-Evaluation 02/12/15   PT Start Time 0135   PT Stop Time 0213   PT Time Calculation (min) 38 min      Past Medical History  Diagnosis Date  . BPH (benign prostatic hypertrophy)   . Depression   . Essential hypertension, benign   . Reflux   . Dementia     Past Surgical History  Procedure Laterality Date  . Transurethral resection of prostate    . Hip arthroplasty Right 10/09/2014    Procedure: Right Hemiarthroplasty;  Surgeon: Rozanna Box, MD;  Location: [facility];  Service: Orthopedics;  Laterality: Right;    There were no vitals taken for this visit.  Visit Diagnosis:  Pain with hip hemiarthroplasty, subsequent encounter  Pain with hip hemiarthroplasty, initial encounter      Subjective Assessment - 01/01/15 1339    Symptoms Care Giver reports patient has been getting around pretty good. He was limping a little earlier and stated he was having a little pain when asked. Otherwise no complaints.    Pertinent History Alzhemier's,    Limitations Walking   Currently in Pain? No/denies                    [facility] Adult PT Treatment/Exercise - 01/01/15 1346    Ambulation/Gait   Stairs Yes   Stairs Assistance 7: Independent   Stair Management Technique Alternating pattern   Number of Stairs 12   Height of Stairs 6   Knee/Hip Exercises: Aerobic   Stationary Bike NuStep level 5, 7 min   Knee/Hip Exercises: Machines for Strengthening   Cybex Leg Press --  1 plate x 25, 2 plates x 20   Knee/Hip Exercises: Standing   Lateral Step Up 15  reps   Forward Step Up 15 reps   Functional Squat 3 sets;10 reps  1 set with RLE back   Wall Squat 10 reps   SLS 8   SLS with Vectors 2x10 3 way hip bilat in parallel bars                  PT Short Term Goals - 12/04/14 1649    PT SHORT TERM GOAL #1   Title "CG Independent with assisting pt with initial HEP   Time 2   Period Weeks   Status Achieved           PT Long Term Goals - 12/25/14 1417    PT LONG TERM GOAL #1   Title "Pt will not c/o of pain with  functional activities   Time 6   Period Weeks   Status On-going   PT LONG TERM GOAL #2   Title Pt will return to adult day- care    Time 6   Period Weeks   Status Achieved   PT LONG TERM GOAL #3   Title TUG will improve to 15 secs to decrease risk for falls.    Time 6   Period Weeks   Status Achieved   PT LONG TERM GOAL #4   Title  R hip ABD improve to 3/5 MMT strength in order for pt to tolerate standing and walking for 30 mins.    Time 6   Period Weeks   Status Partially Met  gait improved to 20 min, have not walked due to cold weather               Plan - 01/01/15 1343    Clinical Impression Statement Care Giver reports walking still limited to 15 minutes before patient c/o increased pain. Pt displays antalgic gait and c/o soreness right thigh with SLS activities.    PT Next Visit Plan continue with general hip.knee/core strength and balance,SLS activities        Problem List Patient Active Problem List   Diagnosis Date Noted  . Anxiety state 10/26/2014  . 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 Acute blood loss anemia 10/15/2014  . Hip fracture, right 10/08/2014  . Alzheimer's dementia with behavioral disturbance 10/08/2014  . Preoperative evaluation 10/08/2014  . Major depressive disorder, recurrent episode 10/05/2007  . HYPERTENSION, BENIGN 10/05/2007  . CYSTITIS, ACUTE 10/05/2007  . BENIGN PROSTATIC HYPERTROPHY, HX OF, S/P TURP 10/05/2007    Dorene Ar,  PTA 01/01/2015, 2:17 PM  [place] [facility] [address] [place], [place], [address] Phone: [phone]   Fax:  [phone]

## 2015-01-03 ENCOUNTER — Ambulatory Visit: Payer: Medicare Other | Admitting: Physical Therapy

## 2015-01-03 DIAGNOSIS — Z96649 Presence of unspecified artificial hip joint: Principal | ICD-10-CM

## 2015-01-03 DIAGNOSIS — T8484XA Pain due to internal orthopedic prosthetic devices, implants and grafts, initial encounter: Secondary | ICD-10-CM | POA: Diagnosis not present

## 2015-01-03 DIAGNOSIS — T8484XD Pain due to internal orthopedic prosthetic devices, implants and grafts, subsequent encounter: Secondary | ICD-10-CM

## 2015-01-03 NOTE — Therapy (Signed)
[place], [place], [address] Phone: [phone]   Fax:  [phone]  Physical Therapy Treatment  Patient Details  Name: Christopher Summers MRN: [id number] Date of Birth: [birth year] Referring Provider:  Hennie Duos, MD  Encounter Date: 01/03/2015      PT End of Session - 01/03/15 1441    Visit Number 10      Past Medical History  Diagnosis Date  . BPH (benign prostatic hypertrophy)   . Depression   . Essential hypertension, benign   . Reflux   . Dementia     Past Surgical History  Procedure Laterality Date  . Transurethral resection of prostate    . Hip arthroplasty Right 10/09/2014    Procedure: Right Hemiarthroplasty;  Surgeon: Rozanna Box, MD;  Location: [facility];  Service: Orthopedics;  Laterality: Right;    There were no vitals taken for this visit.  Visit Diagnosis:  Pain with hip hemiarthroplasty, subsequent encounter  Pain with hip hemiarthroplasty, initial encounter      Subjective Assessment - 01/03/15 1339    Symptoms Denies pain. Pt. spouse reporting he is doing well at home, no c/o pain or soreness after his last visit.    Pertinent History Alzhemier's,    Currently in Pain? No/denies                    [facility] Adult PT Treatment/Exercise - 01/03/15 1347    Lumbar Exercises: Seated   Other Seated Lumbar Exercises trunk rotation on ball, marching on ball for core stability  max cueing for rotation    Knee/Hip Exercises: Aerobic   Stationary Bike NuStep level 7 UE and LE, 6 min    Knee/Hip Exercises: Machines for Strengthening   Cybex Leg Press --  2 plates x 20 , 1 plate with Rt. LE x 10 need A   Knee/Hip Exercises: Standing   Other Standing Knee Exercises sit to stand no UE x 15 with slow eccentric   Knee/Hip Exercises: Seated   Long Arc Quad Strengthening;Right;1 set;20 reps;Weights   Long Arc Quad Weight 5 lbs.   Knee/Hip Exercises: Supine   Bridges 2 sets   Bridges Limitations 1 set with 1 leg  each x10   Other Supine Knee Exercises Glute set x 10    Other Supine Knee Exercises lower trunk rotation x 10   Knee/Hip Exercises: Sidelying   Hip ABduction Both;1 set;10 reps           PT Education - 01/03/15 1440    Education provided No          PT Short Term Goals - 12/04/14 1649    PT SHORT TERM GOAL #1   Title "CG Independent with assisting pt with initial HEP   Time 2   Period Weeks   Status Achieved           PT Long Term Goals - 12/25/14 1417    PT LONG TERM GOAL #1   Title "Pt will not c/o of pain with  functional activities   Time 6   Period Weeks   Status On-going   PT LONG TERM GOAL #2   Title Pt will return to adult day- care    Time 6   Period Weeks   Status Achieved   PT LONG TERM GOAL #3   Title TUG will improve to 15 secs to decrease risk for falls.    Time 6   Period Weeks  Status Achieved   PT LONG TERM GOAL #4   Title R hip ABD improve to 3/5 MMT strength in order for pt to tolerate standing and walking for 30 mins.    Time 6   Period Weeks   Status Partially Met  gait improved to 20 min, have not walked due to cold weather               Plan - 2015/01/18 1441    Clinical Impression Statement Patient is tolerating fairly challenging exercises without c/o pain.  Noticeable difficulty today with single leg press and also with differentiating upper and lower trunk on stability ball. Briefly c/o knee pain with walking today, limping increased after session.    PT Frequency 1x / week   PT Duration 6 weeks   PT Next Visit Plan continue with general hip.knee/core strength and balance,SLS activities   PT Home Exercise Plan cont as previous   Consulted and Agree with Plan of Care Patient     DO TUG NEXT VISIT     G-Codes - 01/18/2015 1435    Functional Assessment Tool Used clinical judgement   Functional Limitation Mobility: Walking and moving around   Mobility: Walking and  Moving Around Current Status [phone]) At least 20 percent but less than 40 percent impaired, limited or restricted   Mobility: Walking and Moving Around Goal Status [phone]) At least 1 percent but less than 20 percent impaired, limited or restricted      Problem List Patient Active Problem List   Diagnosis Date Noted  . Anxiety state 10/26/2014  . 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 Acute blood loss anemia 10/15/2014  . Hip fracture, right 10/08/2014  . Alzheimer's dementia with behavioral disturbance 10/08/2014  . Preoperative evaluation 10/08/2014  . Major depressive disorder, recurrent episode 10/05/2007  . HYPERTENSION, BENIGN 10/05/2007  . CYSTITIS, ACUTE 10/05/2007  . BENIGN PROSTATIC HYPERTROPHY, HX OF, S/P TURP 10/05/2007    PAA,JENNIFER 01/18/15, 2:43 PM  [facility] [address] [place], [place], [address] Phone: [phone]   Fax:  [phone]

## 2015-01-12 ENCOUNTER — Emergency Department (INDEPENDENT_AMBULATORY_CARE_PROVIDER_SITE_OTHER)
Admission: EM | Admit: 2015-01-12 | Discharge: 2015-01-12 | Disposition: A | Discharge status: Home / Self Care | Payer: Medicare Other | Source: Home / Self Care | Attending: Emergency Medicine | Admitting: Emergency Medicine

## 2015-01-12 ENCOUNTER — Encounter (HOSPITAL_COMMUNITY): Payer: Self-pay | Admitting: Emergency Medicine

## 2015-01-12 ENCOUNTER — Emergency Department (INDEPENDENT_AMBULATORY_CARE_PROVIDER_SITE_OTHER): Payer: Medicare Other

## 2015-01-12 DIAGNOSIS — R3916 Straining to void: Secondary | ICD-10-CM

## 2015-01-12 DIAGNOSIS — R310 Gross hematuria: Secondary | ICD-10-CM | POA: Diagnosis not present

## 2015-01-12 DIAGNOSIS — R319 Hematuria, unspecified: Secondary | ICD-10-CM | POA: Diagnosis not present

## 2015-01-12 DIAGNOSIS — N401 Enlarged prostate with lower urinary tract symptoms: Secondary | ICD-10-CM

## 2015-01-12 LAB — POCT URINALYSIS DIP (DEVICE)
Bilirubin Urine: NEGATIVE
Glucose, UA: NEGATIVE mg/dL
Ketones, ur: NEGATIVE mg/dL
Leukocytes, UA: NEGATIVE
Nitrite: NEGATIVE
Protein, ur: 100 mg/dL — AB
Specific Gravity, Urine: 1.025 (ref 1.005–1.030)
Urobilinogen, UA: 0.2 mg/dL (ref 0.0–1.0)
pH: 5.5 (ref 5.0–8.0)

## 2015-01-12 NOTE — ED Notes (Signed)
Wife reports noticing pt's urine is very dark in color last night and early this a.m.  Pt states "I have a hard time starting urine flow".   Denies any other symptoms.    No fever. N/v/d

## 2015-01-12 NOTE — Discharge Instructions (Signed)
Hematuria Hold Xarelto for now Call your doctor Monday. If worse go to the Emergency Department Hematuria is blood in your urine. It can be caused by a bladder infection, kidney infection, prostate infection, kidney stone, or cancer of your urinary tract. Infections can usually be treated with medicine, and a kidney stone usually will pass through your urine. If neither of these is the cause of your hematuria, further workup to find out the reason may be needed. It is very important that you tell your health care provider about any blood you see in your urine, even if the blood stops without treatment or happens without causing pain. Blood in your urine that happens and then stops and then happens again can be a symptom of a very serious condition. Also, pain is not a symptom in the initial stages of many urinary cancers. HOME CARE INSTRUCTIONS   Drink lots of fluid, 3-4 quarts a day. If you have been diagnosed with an infection, cranberry juice is especially recommended, in addition to large amounts of water.  Avoid caffeine, tea, and carbonated beverages because they tend to irritate the bladder.  Avoid alcohol because it may irritate the prostate.  Take all medicines as directed by your health care provider.  If you were prescribed an antibiotic medicine, finish it all even if you start to feel better.  If you have been diagnosed with a kidney stone, follow your health care provider's instructions regarding straining your urine to catch the stone.  Empty your bladder often. Avoid holding urine for long periods of time.  After a bowel movement, women should cleanse front to back. Use each tissue only once.  Empty your bladder before and after sexual intercourse if you are a male. SEEK MEDICAL CARE IF:  You develop back pain.  You have a fever.  You have a feeling of sickness in your stomach (nausea) or vomiting.  Your symptoms are not better in 3 days. Return sooner if you are  getting worse. SEEK IMMEDIATE MEDICAL CARE IF:   You develop severe vomiting and are unable to keep the medicine down.  You develop severe back or abdominal pain despite taking your medicines.  You begin passing a large amount of blood or clots in your urine.  You feel extremely weak or faint, or you pass out. MAKE SURE YOU:   Understand these instructions.  Will watch your condition.  Will get help right away if you are not doing well or get worse. Document Released: 11/30/2005 Document Revised: 04/16/2014 Document Reviewed: 07/31/2013 [facility] Patient Information 2015 [place], [place]. This information is not intended to replace advice given to you by your health care provider. Make sure you discuss any questions you have with your health care provider.  Benign Prostatic Hyperplasia An enlarged prostate (benign prostatic hyperplasia) is common in older men. You may experience the following:  Weak urine stream.  Dribbling.  Feeling like the bladder has not emptied completely.  Difficulty starting urination.  Getting up frequently at night to urinate.  Urinating more frequently during the day. HOME CARE INSTRUCTIONS  Monitor your prostatic hyperplasia for any changes. The following actions may help to alleviate any discomfort you are experiencing:  Give yourself time when you urinate.  Stay away from alcohol.  Avoid beverages containing caffeine, such as coffee, tea, and colas, because they can make the problem worse.  Avoid decongestants, antihistamines, and some prescription medicines that can make the problem worse.  Follow up with your health care provider for  further treatment as recommended. SEEK MEDICAL CARE IF:  You are experiencing progressive difficulty voiding.  Your urine stream is progressively getting narrower.  You are awaking from sleep with the urge to void more frequently.  You are constantly feeling the need to void.  You experience loss of  urine, especially in small amounts. SEEK IMMEDIATE MEDICAL CARE IF:   You develop increased pain with urination or are unable to urinate.  You develop severe abdominal pain, vomiting, a high fever, or fainting.  You develop back pain or blood in your urine. MAKE SURE YOU:   Understand these instructions.  Will watch your condition.  Will get help right away if you are not doing well or get worse. Document Released: 11/30/2005 Document Revised: 08/02/2013 Document Reviewed: 05/02/2013 [facility] Patient Information 2015 [place], [place]. This information is not intended to replace advice given to you by your health care provider. Make sure you discuss any questions you have with your health care provider.

## 2015-01-12 NOTE — ED Provider Notes (Signed)
CSN: [id number]     Arrival date & time 01/12/15  1418 History   First MD Initiated Contact with Patient 01/12/15 1624     Chief Complaint  Patient presents with  . Hematuria    c/o  dark urine   (Consider location/radiation/quality/duration/timing/severity/associated sxs/prior Treatment) HPI Comments: 74 year old male with also hours to [address] is accompanied by his wife with a complaint of recent hematuria and chronic problems initiating urine strain. There is no associated complaints of pain. The patient is taking Namenda, trazodone, Aricept, Norco status post hip surgery thanks or L toe for DVT prophylaxis that developed after his surgery. He also has a history of BPH. The patient has no particular complaints himself.   Past Medical History  Diagnosis Date  . BPH (benign prostatic hypertrophy)   . Depression   . Essential hypertension, benign   . Reflux   . Dementia    Past Surgical History  Procedure Laterality Date  . Transurethral resection of prostate    . Hip arthroplasty Right 10/09/2014    Procedure: Right Hemiarthroplasty;  Surgeon: Rozanna Box, MD;  Location: [facility];  Service: Orthopedics;  Laterality: Right;   Family History  Problem Relation Age of Onset  . Heart disease Brother    History  Substance Use Topics  . Smoking status: Current Every Day Smoker -- 0.50 packs/day    Types: Cigarettes  . Smokeless tobacco: Former Systems developer    Quit date: 10/09/2014  . Alcohol Use: No    Review of Systems  Constitutional: Negative for fever, activity change and fatigue.  HENT: Negative.   Respiratory: Negative.   Cardiovascular: Negative for chest pain.  Gastrointestinal: Negative.   Genitourinary: Positive for frequency, hematuria and difficulty urinating. Negative for dysuria, urgency, penile swelling, scrotal swelling, penile pain and testicular pain.    Allergies  Niacin and related and Terazosin  Home Medications   Prior to Admission medications   Medication  Sig Start Date End Date Taking? Authorizing Provider  hydrOXYzine (ATARAX/VISTARIL) 10 MG tablet Take 10 mg by mouth 3 (three) times daily as needed.   Yes Historical Provider, MD  memantine (NAMENDA) 10 MG tablet Take 10 mg by mouth daily.   Yes Historical Provider, MD  rivaroxaban (XARELTO) 20 MG TABS tablet Take 20 mg by mouth daily with supper.   Yes Historical Provider, MD  sertraline (ZOLOFT) 50 MG tablet Take 50 mg by mouth daily.   Yes Historical Provider, MD  temazepam (RESTORIL) 15 MG capsule Take one capsule by mouth at bedtime 10/19/14  Yes Monina C Medina-Vargas, NP  traZODone (DESYREL) 50 MG tablet Take 50 mg by mouth at bedtime.   Yes Historical Provider, MD  bisacodyl (DULCOLAX) 10 MG suppository Place 1 suppository (10 mg total) rectally daily as needed for moderate constipation. Patient not taking: Reported on 11/21/2014 10/11/14   Kelvin Cellar, MD  divalproex (DEPAKOTE ER) 250 MG 24 hr tablet Take 250 mg by mouth daily after lunch.    Historical Provider, MD  docusate sodium 100 MG CAPS Take 100 mg by mouth 2 (two) times daily. 10/11/14   Kelvin Cellar, MD  donepezil (ARICEPT) 10 MG tablet Take 10 mg by mouth at bedtime.     Historical Provider, MD  HYDROcodone-acetaminophen (NORCO/VICODIN) 5-325 MG per tablet Take 1-2 tablets by mouth every 6 (six) hours as needed for moderate pain. 10/11/14   Kelvin Cellar, MD  HYDROcodone-acetaminophen (NORCO/VICODIN) 5-325 MG per tablet Take 1 tablet by mouth every 6 (six) hours as needed  for moderate pain.    Historical Provider, MD  LORazepam (ATIVAN) 0.5 MG tablet Take 0.25 mg by mouth every 8 (eight) hours.    Historical Provider, MD  LORazepam (ATIVAN) 2 MG/ML injection Inject 1 mg into the vein every 6 (six) hours as needed (severe agitation).    Historical Provider, MD  vitamin B-12 (CYANOCOBALAMIN) 100 MCG tablet Take 100 mcg by mouth daily as needed (for vitamin).    Historical Provider, MD   BP 142/75 mmHg  Pulse 68   Temp(Src) 98.3 F (36.8 C) (Oral)  Resp 16  SpO2 98% Physical Exam  Constitutional: No distress.  Neck: Normal range of motion. Neck supple.  Pulmonary/Chest: Effort normal. No respiratory distress.  Abdominal: Soft. There is no tenderness.  Genitourinary: Guaiac negative stool.  Prostate enlarged with increased firmness. Positive for tenderness during the rectal exam. Guaiac is negative for blood.  Neurological: He is alert. He exhibits normal muscle tone.  Skin: Skin is dry.  Psychiatric:  Mildly agitated, associated with dementia.  Nursing note and vitals reviewed.   ED Course  Procedures (including critical care time) Labs Review Labs Reviewed  POCT URINALYSIS DIP (DEVICE) - Abnormal; Notable for the following:    Hgb urine dipstick LARGE (*)    Protein, ur 100 (*)    All other components within normal limits    Imaging Review Dg Abd 1 View  01/12/2015   CLINICAL DATA:  Hematuria.  EXAM: ABDOMEN - 1 VIEW  COMPARISON:  None.  FINDINGS: No convincing renal or ureteral stones. Several calculi in the pelvis are most consistent with arterial vascular calcifications and phleboliths.  Normal bowel gas pattern.  There are degenerative changes of the lumbar spine. Right hip prosthesis is well aligned but incompletely imaged.  IMPRESSION: No acute findings.  No convincing renal or ureteral stones.   Electronically Signed   By: Lajean Manes M.D.   On: 01/12/2015 17:14     MDM   1. Hematuria, gross   2. Benign prostatic hyperplasia (BPH) with straining on urination    Patient's complaint is painless hematuria associated with chronic difficulty initiation of urine straining due to BPH. Recommended the patient see a urologist as soon as possible. Etiology of hematuria is uncertain. See KUB result above. There is no blood in the stool Hemoccult testing. Since the patient is on Xarelto for DVT prophylaxis and has planned to stop the medication and 21 days we will have him stop the  medicine for today and call his doctor on Monday to see if he wants to reinstate it. For any worsening new symptoms or problems go to the emergency department. Other contributing factors include medications such as Namenda, Zoloft, trazodone, Aricept, Norco and lorazepam.\           Janne Napoleon, NP 01/12/15 [phone]

## 2015-01-14 ENCOUNTER — Telehealth: Payer: Self-pay | Admitting: *Deleted

## 2015-01-14 ENCOUNTER — Ambulatory Visit: Payer: Medicare Other | Attending: Orthopedic Surgery | Admitting: Rehabilitation

## 2015-01-14 DIAGNOSIS — T8484XD Pain due to internal orthopedic prosthetic devices, implants and grafts, subsequent encounter: Secondary | ICD-10-CM

## 2015-01-14 DIAGNOSIS — Z96649 Presence of unspecified artificial hip joint: Secondary | ICD-10-CM

## 2015-01-14 DIAGNOSIS — T8484XA Pain due to internal orthopedic prosthetic devices, implants and grafts, initial encounter: Secondary | ICD-10-CM | POA: Diagnosis not present

## 2015-01-14 NOTE — Therapy (Signed)
[place], [place], [address] Phone: [phone]   Fax:  [phone]  Physical Therapy Treatment  Patient Details  Name: Christopher Summers MRN: [id number] Date of Birth: Aug 02, 1941 Referring Provider:  Hennie Duos, MD  Encounter Date: 01/14/2015      PT End of Session - 01/14/15 1230    Visit Number 11   Number of Visits 16   Date for PT Re-Evaluation 02/12/15   PT Start Time 1020   PT Stop Time 1100   PT Time Calculation (min) 40 min      Past Medical History  Diagnosis Date  . BPH (benign prostatic hypertrophy)   . Depression   . Essential hypertension, benign   . Reflux   . Dementia     Past Surgical History  Procedure Laterality Date  . Transurethral resection of prostate    . Hip arthroplasty Right 10/09/2014    Procedure: Right Hemiarthroplasty;  Surgeon: Rozanna Box, MD;  Location: [facility];  Service: Orthopedics;  Laterality: Right;    There were no vitals taken for this visit.  Visit Diagnosis:  Pain with hip hemiarthroplasty, subsequent encounter  Pain with hip hemiarthroplasty, initial encounter      Subjective Assessment - 01/14/15 1023    Symptoms Pt's wife states pt complained of pain first thing  in the morning saturday and during church. He admits "a little pain" when asked if he has hip pain and he demonstrates antalgic gait entering clinic using [place].           [facility] PT Assessment - 01/14/15 1221    Strength   Right Hip Flexion 4-/5   Right Hip ABduction 4-/5   Left Hip Flexion 4+/5   Left Hip ABduction 4+/5   Right Knee Extension --  4-/5   Left Knee Extension 4+/5                  [facility] Adult PT Treatment/Exercise - 01/14/15 1224    Lumbar Exercises: Seated   Long Arc Quad on Chair 10 reps;2 sets   LAQ on Chair Weights (lbs) 3  c/o anterior knee pain, cues for posture   Sit to Stand 10 reps  c/o knee pain    Knee/Hip Exercises: Aerobic    Stationary Bike NuStep level 5 UE and LE, 6 min    Knee/Hip Exercises: Standing   Knee Flexion 2 sets;10 reps;Both   SLS with Vectors hip abdct 10 x 2 each , march 10 x 2 c/o of anterior thigh pain   Knee/Hip Exercises: Supine   Bridges 2 sets;10 reps  also bridge with clams green band   Straight Leg Raises 2 sets;10 reps   Knee/Hip Exercises: Sidelying   Hip ABduction 2 sets;10 reps   Clams 10x 2 added 2#                PT Education - 01/14/15 1228    Education provided Yes   Education Details Continued weakness, benefit of HEP compliance   Person(s) Educated Spouse   Methods Explanation   Comprehension Verbalized understanding          PT Short Term Goals - 12/04/14 1649    PT SHORT TERM GOAL #1   Title "CG Independent with assisting pt with initial HEP   Time 2   Period Weeks   Status Achieved           PT Long Term Goals - 12/25/14 1417  PT LONG TERM GOAL #1   Title "Pt will not c/o of pain with  functional activities   Time 6   Period Weeks   Status On-going   PT LONG TERM GOAL #2   Title Pt will return to adult day- care    Time 6   Period Weeks   Status Achieved   PT LONG TERM GOAL #3   Title TUG will improve to 15 secs to decrease risk for falls.    Time 6   Period Weeks   Status Achieved   PT LONG TERM GOAL #4   Title R hip ABD improve to 3/5 MMT strength in order for pt to tolerate standing and walking for 30 mins.    Time 6   Period Weeks   Status Partially Met  gait improved to 20 min, have not walked due to cold weather               Plan - 01/14/15 1024    Clinical Impression Statement Pt has been walking with wife 15-20 minutes before comlaining of pain. He spends most of his time on his feet and wife reports he c/o pain with prolonged walking and sitting. Continued right  hip and knee weakness. His wife reports difficulty with his compliance to HEP. He does better in the clinic. She can only get him to due a few SLRs  and standing marches. Increased complaints of anterior  knee pain today with therex.    PT Next Visit Plan continue with general hip.knee/core strength and balance,SLS activities, TUG next        Problem List Patient Active Problem List   Diagnosis Date Noted  . Anxiety state 10/26/2014  . 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 Acute blood loss anemia 10/15/2014  . Hip fracture, right 10/08/2014  . Alzheimer's dementia with behavioral disturbance 10/08/2014  . Preoperative evaluation 10/08/2014  . Major depressive disorder, recurrent episode 10/05/2007  . HYPERTENSION, BENIGN 10/05/2007  . CYSTITIS, ACUTE 10/05/2007  . BENIGN PROSTATIC HYPERTROPHY, HX OF, S/P TURP 10/05/2007    Dorene Ar, PTA 01/14/2015, 12:31 PM  [facility] [address] [place], [place], [address] Phone: [phone]   Fax:  [phone]

## 2015-01-14 NOTE — Telephone Encounter (Signed)
APPTS MADE AND PRINTED...TD 

## 2015-01-16 ENCOUNTER — Other Ambulatory Visit: Payer: Self-pay | Admitting: Internal Medicine

## 2015-01-19 ENCOUNTER — Other Ambulatory Visit: Payer: Self-pay | Admitting: Internal Medicine

## 2015-01-21 ENCOUNTER — Encounter: Payer: Medicare Other | Admitting: Physical Therapy

## 2015-01-22 ENCOUNTER — Ambulatory Visit: Payer: Medicare Other | Admitting: Physical Therapy

## 2015-01-22 DIAGNOSIS — T8484XA Pain due to internal orthopedic prosthetic devices, implants and grafts, initial encounter: Secondary | ICD-10-CM | POA: Diagnosis not present

## 2015-01-22 DIAGNOSIS — Z96649 Presence of unspecified artificial hip joint: Principal | ICD-10-CM

## 2015-01-22 DIAGNOSIS — T8484XD Pain due to internal orthopedic prosthetic devices, implants and grafts, subsequent encounter: Secondary | ICD-10-CM

## 2015-01-22 NOTE — Therapy (Signed)
[place], [place], [address] Phone: [phone]   Fax:  [phone]  Physical Therapy Treatment  Patient Details  Name: Christopher Summers MRN: [id number] Date of Birth: July 18, 1941 Referring Provider:  Hennie Duos, MD  Encounter Date: 01/22/2015      PT End of Session - 01/22/15 1229    PT Start Time 9   PT Stop Time 1100   PT Time Calculation (min) 41 min   Activity Tolerance Patient tolerated treatment well      Past Medical History  Diagnosis Date  . BPH (benign prostatic hypertrophy)   . Depression   . Essential hypertension, benign   . Reflux   . Dementia     Past Surgical History  Procedure Laterality Date  . Transurethral resection of prostate    . Hip arthroplasty Right 10/09/2014    Procedure: Right Hemiarthroplasty;  Surgeon: Rozanna Box, MD;  Location: [facility];  Service: Orthopedics;  Laterality: Right;    There were no vitals taken for this visit.  Visit Diagnosis:  Pain with hip hemiarthroplasty, initial encounter  Pain with hip hemiarthroplasty, subsequent encounter      Subjective Assessment - 01/22/15 1027    Symptoms Pt. (and wife) report soreness in Rt ant knee and Rt thigh with activity. Wife says patient does exercises at day program 3-4 times per week.     Pertinent History Alzhemier's,    Limitations Walking   Currently in Pain? Yes   Pain Score --  does not rate-appears min to mod   Pain Location Hip   Pain Orientation Right   Pain Type Chronic pain   Pain Onset More than a month ago   Pain Frequency Intermittent   Aggravating Factors  walking, activitiy   Multiple Pain Sites Yes   Wong-Baker Pain Rating Hurts a little bit   Pain Type Chronic pain   Pain Location Knee   Pain Orientation Right;Anterior   Pain Frequency Intermittent   Pain Onset On-going                    [facility] Adult PT Treatment/Exercise - 01/22/15 1041    Knee/Hip Exercises: Aerobic   Stationary Bike NuStep level 5 UE and LE, 6 min    Knee/Hip Exercises: Machines for Strengthening   Cybex Knee Extension 1 plate 2 sets x 15 cues for pace   Cybex Knee Flexion 2 plates 2sets L97   Knee/Hip Exercises: Standing   Heel Raises 20 reps   Functional Squat 2 sets;20 reps   Functional Squat Limitations used mirror   Gait Training with cane    Other Standing Knee Exercises march x 20    Other Standing Knee Exercises walked in tandem forward and back    Knee/Hip Exercises: Sidelying   Hip ABduction Strengthening;Both;2 sets   Clams 10 x2 sets   Knee/Hip Exercises: Prone   Hip Extension Strengthening;Both;2 sets;10 reps                PT Education - 01/22/15 1047    Education provided Yes   Education Details [place] of progress, plan to DC after next 1-2 visits.    Person(s) Educated Spouse   Methods Explanation   Comprehension Verbalized understanding          PT Short Term Goals - 12/04/14 1649    PT SHORT TERM GOAL #1   Title "CG Independent with assisting pt with initial HEP   Time  2   Period Weeks   Status Achieved           PT Long Term Goals - 12/25/14 1417    PT LONG TERM GOAL #1   Title "Pt will not c/o of pain with  functional activities   Time 6   Period Weeks   Status On-going   PT LONG TERM GOAL #2   Title Pt will return to adult day- care    Time 6   Period Weeks   Status Achieved   PT LONG TERM GOAL #3   Title TUG will improve to 15 secs to decrease risk for falls.    Time 6   Period Weeks   Status Achieved   PT LONG TERM GOAL #4   Title R hip ABD improve to 3/5 MMT strength in order for pt to tolerate standing and walking for 30 mins.    Time 6   Period Weeks   Status Partially Met  gait improved to 20 min, have not walked due to cold weather               Plan - 01/22/15 1047    Clinical Impression Statement Patient is able to tolerate exercise in clinic.  He does not do well with  HEP at home due to cognition, needs encouragement and max cueing.  He has not met goals further. He has 3 more visits and will finish POC.    PT Frequency --   PT Next Visit Plan continue with general hip.knee/core strength and balance,SLS activities, may benefit from full HEP review   PT Home Exercise Plan cont as previous   Consulted and Agree with Plan of Care Patient        Problem List Patient Active Problem List   Diagnosis Date Noted  . Anxiety state 10/26/2014  . 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 Acute blood loss anemia 10/15/2014  . Hip fracture, right 10/08/2014  . Alzheimer's dementia with behavioral disturbance 10/08/2014  . Preoperative evaluation 10/08/2014  . Major depressive disorder, recurrent episode 10/05/2007  . HYPERTENSION, BENIGN 10/05/2007  . CYSTITIS, ACUTE 10/05/2007  . BENIGN PROSTATIC HYPERTROPHY, HX OF, S/P TURP 10/05/2007    PAA,JENNIFER 01/22/2015, 12:31 PM  [facility] [address] [place], [place], [address] Phone: [phone]   Fax:  [phone]

## 2015-01-23 ENCOUNTER — Encounter: Payer: Medicare Other | Admitting: Physical Therapy

## 2015-01-28 ENCOUNTER — Ambulatory Visit: Payer: Medicare Other | Admitting: Rehabilitation

## 2015-01-30 ENCOUNTER — Encounter: Payer: Medicare Other | Admitting: Physical Therapy

## 2015-01-30 DIAGNOSIS — S72001D Fracture of unspecified part of neck of right femur, subsequent encounter for closed fracture with routine healing: Secondary | ICD-10-CM | POA: Diagnosis not present

## 2015-02-04 ENCOUNTER — Ambulatory Visit: Payer: Medicare Other | Admitting: Physical Therapy

## 2015-02-04 DIAGNOSIS — T8484XA Pain due to internal orthopedic prosthetic devices, implants and grafts, initial encounter: Secondary | ICD-10-CM | POA: Diagnosis not present

## 2015-02-04 DIAGNOSIS — T8484XD Pain due to internal orthopedic prosthetic devices, implants and grafts, subsequent encounter: Secondary | ICD-10-CM

## 2015-02-04 DIAGNOSIS — R269 Unspecified abnormalities of gait and mobility: Secondary | ICD-10-CM

## 2015-02-04 DIAGNOSIS — Z96649 Presence of unspecified artificial hip joint: Principal | ICD-10-CM

## 2015-02-04 DIAGNOSIS — R29898 Other symptoms and signs involving the musculoskeletal system: Secondary | ICD-10-CM

## 2015-02-04 NOTE — Therapy (Signed)
[place], [place], [address] Phone: [phone]   Fax:  [phone]  Physical Therapy Treatment  Patient Details  Name: Christopher Summers MRN: [id number] Date of Birth: 02-09-1941 Referring Provider:  Hennie Duos, MD  Encounter Date: 02/04/2015      PT End of Session - 02/04/15 1100    Visit Number 13   Number of Visits 16   Date for PT Re-Evaluation 02/12/15  Patient's last day is 02/13/15   PT Start Time 1015   PT Stop Time 1105   PT Time Calculation (min) 50 min   Activity Tolerance Patient tolerated treatment well      Past Medical History  Diagnosis Date  . BPH (benign prostatic hypertrophy)   . Depression   . Essential hypertension, benign   . Reflux   . Dementia     Past Surgical History  Procedure Laterality Date  . Transurethral resection of prostate    . Hip arthroplasty Right 10/09/2014    Procedure: Right Hemiarthroplasty;  Surgeon: Rozanna Box, MD;  Location: [place];  Service: Orthopedics;  Laterality: Right;    There were no vitals taken for this visit.  Visit Diagnosis:  Pain with hip hemiarthroplasty, subsequent encounter  Weakness of right hip  Abnormality of gait      Subjective Assessment - 02/04/15 1048    Symptoms Walked for 30 min this weekend and did great.  Limping today. Saw MD and XR knee, hip, everything looked good.     Pertinent History Alzhemier's,    Limitations Walking   Currently in Pain? Yes   Pain Score --  appears moderate   Pain Location Hip   Pain Orientation Right   Pain Type Chronic pain   Pain Radiating Towards knee   Pain Onset More than a month ago   Pain Frequency Intermittent          [facility] PT Assessment - 02/04/15 1026    Strength   Right Hip Flexion 4-/5   Right Knee Flexion 4+/5   Right Knee Extension 4/5   Left Knee Flexion 5/5   Left Knee Extension 5/5     Rt. Hip abd 3-/5, ext 3+/5 Lt. 4-/5 abd, ext  4/5        [facility] Adult PT Treatment/Exercise - 02/04/15 1031    Lumbar Exercises: Seated   Sit to Stand 10 reps   Knee/Hip Exercises: Stretches   Passive Hamstring Stretch 2 reps;30 seconds   Knee/Hip Exercises: Aerobic   Stationary Bike NuStep level 5, 6 min   Knee/Hip Exercises: Standing   Functional Squat 1 set;10 reps   SLS hip abd 2 x 10 each LE    Knee/Hip Exercises: Seated   Other Seated Knee Exercises seated hip ab/ER green Tband x 20   Knee/Hip Exercises: Supine   Quad Sets Strengthening;Right;1 set;10 reps   Bridges Strengthening;1 set;20 reps   Straight Leg Raises 1 set;10 reps   Knee/Hip Exercises: Sidelying   Hip ABduction Strengthening;Both;1 set;10 reps  did not tolerate, too sore   Clams x20  done in supine to ease pain   Knee/Hip Exercises: Prone   Hip Extension Strengthening;1 set                PT Education - 02/04/15 1059    Education provided Yes   Education Details HEP verbal and demo   Person(s) Educated Spouse;Patient   Methods Explanation;Demonstration   Comprehension Verbalized understanding  PT Short Term Goals - 12/04/14 1649    PT SHORT TERM GOAL #1   Title "CG Independent with assisting pt with initial HEP   Time 2   Period Weeks   Status Achieved           PT Long Term Goals - 02/04/15 1039    PT LONG TERM GOAL #1   Title "Pt will not c/o of pain with  functional activities   Status Partially Met  Pain varies day to day   PT LONG TERM GOAL #2   Title Pt will return to adult day- care    Status Achieved   PT LONG TERM GOAL #3   Title TUG will improve to 15 secs to decrease risk for falls.    Status Achieved   PT LONG TERM GOAL #4   Title R hip ABD improve to 3/5 MMT strength in order for pt to tolerate standing and walking for 30 mins.    Status Achieved               Plan - 02/04/15 1100    Clinical Impression Statement Patient has met all LTGs except consistency of pain/ADLs.  Today he had  increased pain and less strength in RLE. Rt hip 3-/5 abd and unable to perform x 10 reps for HEP.    PT Next Visit Plan review Hip ther ex, cont MHP is favorable and DC/FOTO. May need a "renewal/DC combo"   PT Home Exercise Plan SLR stand and supine/SL   Consulted and Agree with Plan of Care Patient;Family member/caregiver   Family Member Consulted Spouse        Problem List Patient Active Problem List   Diagnosis Date Noted  . Anxiety state 10/26/2014  . 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 Acute blood loss anemia 10/15/2014  . Hip fracture, right 10/08/2014  . Alzheimer's dementia with behavioral disturbance 10/08/2014  . Preoperative evaluation 10/08/2014  . Major depressive disorder, recurrent episode 10/05/2007  . HYPERTENSION, BENIGN 10/05/2007  . CYSTITIS, ACUTE 10/05/2007  . BENIGN PROSTATIC HYPERTROPHY, HX OF, S/P TURP 10/05/2007    PAA,JENNIFER 02/04/2015, 11:05 AM  [facility] [address] [place], [place], [address] Phone: [phone]   Fax:  [phone]

## 2015-02-13 ENCOUNTER — Ambulatory Visit: Payer: Medicare Other | Attending: Orthopedic Surgery | Admitting: Rehabilitation

## 2015-02-13 DIAGNOSIS — Z96649 Presence of unspecified artificial hip joint: Secondary | ICD-10-CM

## 2015-02-13 DIAGNOSIS — T8484XA Pain due to internal orthopedic prosthetic devices, implants and grafts, initial encounter: Secondary | ICD-10-CM | POA: Diagnosis not present

## 2015-02-13 DIAGNOSIS — R29898 Other symptoms and signs involving the musculoskeletal system: Secondary | ICD-10-CM

## 2015-02-13 DIAGNOSIS — T8484XD Pain due to internal orthopedic prosthetic devices, implants and grafts, subsequent encounter: Secondary | ICD-10-CM

## 2015-02-13 DIAGNOSIS — R269 Unspecified abnormalities of gait and mobility: Secondary | ICD-10-CM

## 2015-02-13 NOTE — Therapy (Signed)
[place], [place], [address] Phone: [phone]   Fax:  [phone]  Physical Therapy Treatment  Patient Details  Name: Christopher Summers MRN: [id number] Date of Birth: 08-16-1941 Referring Provider:  Hennie Duos, MD  Encounter Date: 02/13/2015      PT End of Session - 02/13/15 1020    Visit Number 14   PT Start Time 0936   PT Stop Time 1015   PT Time Calculation (min) 39 min      Past Medical History  Diagnosis Date  . BPH (benign prostatic hypertrophy)   . Depression   . Essential hypertension, benign   . Reflux   . Dementia     Past Surgical History  Procedure Laterality Date  . Transurethral resection of prostate    . Hip arthroplasty Right 10/09/2014    Procedure: Right Hemiarthroplasty;  Surgeon: Rozanna Box, MD;  Location: [place];  Service: Orthopedics;  Laterality: Right;    There were no vitals taken for this visit.  Visit Diagnosis:  Pain with hip hemiarthroplasty, subsequent encounter  Weakness of right hip  Abnormality of gait  Pain with hip hemiarthroplasty, initial encounter                  [facility] Adult PT Treatment/Exercise - 02/13/15 0940    Ambulation/Gait   Ambulation/Gait Yes   Ambulation/Gait Assistance 6: Modified independent (Device/Increase time)   Ambulation Distance (Feet) 50 Feet   Assistive device Straight cane   Gait Pattern Antalgic   Ambulation Surface Level;Indoor   Gait Comments Pt with decreased antalgic gait with verbal cues and assistive device placed in left hand. Pt c/o right knee and thigh pain intermittently   Knee/Hip Exercises: Aerobic   Stationary Bike Nustep L 4 x 5 min   Knee/Hip Exercises: Standing   Functional Squat 1 set;10 reps   Knee/Hip Exercises: Seated   Other Seated Knee Exercises seated hip ab/ER green Tband x 20   Other Seated Knee Exercises Seated march with green band, seated hamstring curls green band  x 20   Knee/Hip Exercises: Supine   Short Arc Quad Sets Right;20 reps   Bridges Strengthening;1 set;20 reps   Straight Leg Raises 2 sets;10 reps   Other Supine Knee Exercises ball squeeze with glut set x 20   Knee/Hip Exercises: Sidelying   Hip ABduction Right;15 reps   Clams x 20 then x 20 with green band no c/p pain                  PT Short Term Goals - 12/04/14 1649    PT SHORT TERM GOAL #1   Title "CG Independent with assisting pt with initial HEP   Time 2   Period Weeks   Status Achieved           PT Long Term Goals - 02/04/15 1039    PT LONG TERM GOAL #1   Title "Pt will not c/o of pain with  functional activities   Status Partially Met  Pain varies day to day   PT LONG TERM GOAL #2   Title Pt will return to adult day- care    Status Achieved   PT LONG TERM GOAL #3   Title TUG will improve to 15 secs to decrease risk for falls.    Status Achieved   PT LONG TERM GOAL #4   Title R hip ABD improve to 3/5 MMT strength in order for pt  to tolerate standing and walking for 30 mins.    Status Achieved               Plan - 02/13/15 1015    Clinical Impression Statement Improved tolerance to hip therex today performing hip exercises through full ROM and increased reps without c/o pain. He c/o pain today with 4 inch step ups indicating right thigh and knee as source of pan. Care Giver to give pt cues to decrease limp with ambulation and make sure SPC is in correct hand. She reports he climbs stairs all the time at home without c/o pain.    PT Next Visit Plan DC to HEP    PHYSICAL THERAPY DISCHARGE SUMMARY  Visits from Start of Care: 14  Current functional level related to goals / functional outcomes: See above for goals met   Remaining deficits: Mild weakness and pain intermittently in knee/hip, gait   Education / Equipment: HEP and guidance for cueing, RICE, posture   Plan: Patient agrees to discharge.  Patient goals were met. Patient is being  discharged due to meeting the stated rehab goals. and financial reasons, plateau of progress.  This visit was included outside of plan of care?  This doc serves as an extension of that to include DC.       Problem List Patient Active Problem List   Diagnosis Date Noted  . Anxiety state 10/26/2014  . 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 Acute blood loss anemia 10/15/2014  . Hip fracture, right 10/08/2014  . Alzheimer's dementia with behavioral disturbance 10/08/2014  . Preoperative evaluation 10/08/2014  . Major depressive disorder, recurrent episode 10/05/2007  . HYPERTENSION, BENIGN 10/05/2007  . CYSTITIS, ACUTE 10/05/2007  . BENIGN PROSTATIC HYPERTROPHY, HX OF, S/P TURP 10/05/2007    Hessie Diener, PTA 02/13/2015 10:22 AM Phone: [phone] Fax: [facility] [address] [place], [place], [address] Phone: [phone]   Fax:  [phone]

## 2015-04-08 ENCOUNTER — Encounter: Payer: Self-pay | Admitting: Family

## 2015-04-08 ENCOUNTER — Ambulatory Visit (INDEPENDENT_AMBULATORY_CARE_PROVIDER_SITE_OTHER): Payer: Medicare Other | Admitting: Family

## 2015-04-08 VITALS — BP 140/84 | HR 57 | Temp 97.7°F | Resp 18 | Ht 71.0 in | Wt 164.0 lb

## 2015-04-08 DIAGNOSIS — N400 Enlarged prostate without lower urinary tract symptoms: Secondary | ICD-10-CM

## 2015-04-08 LAB — POCT URINALYSIS DIPSTICK
BILIRUBIN UA: NEGATIVE
Blood, UA: NEGATIVE
Glucose, UA: NEGATIVE
KETONES UA: NEGATIVE
Leukocytes, UA: NEGATIVE
Nitrite, UA: NEGATIVE
Spec Grav, UA: 1.025
Urobilinogen, UA: NEGATIVE
pH, UA: 6

## 2015-04-08 MED ORDER — FINASTERIDE 5 MG PO TABS: 5.0000 mg | ORAL_TABLET | Freq: Every day | ORAL | Status: DC

## 2015-04-08 NOTE — Assessment & Plan Note (Addendum)
Symptoms and exam consistent with benign prosthetic hyperplasia. In office UA was negative for leukocytes, hematuia, and nitrites. Given allergy to Terazosin, will start finasteride. Discussed timed/scheduled voiding. If symptoms fail to improve with finasteride, will refer to urology.

## 2015-04-08 NOTE — Progress Notes (Signed)
Pre visit review using our clinic review tool, if applicable. No additional management support is needed unless otherwise documented below in the visit note. 

## 2015-04-08 NOTE — Patient Instructions (Signed)
Thank you for choosing [facility].  Summary/Instructions:  Your prescription(s) have been submitted to your pharmacy or been printed and provided for you. Please take as directed and contact our office if you believe you are having problem(s) with the medication(s) or have any questions.  If your symptoms worsen or fail to improve, please contact our office for further instruction, or in case of emergency go directly to the emergency room at the closest medical facility.   Benign Prostatic Hyperplasia An enlarged prostate (benign prostatic hyperplasia) is common in older men. You may experience the following:  Weak urine stream.  Dribbling.  Feeling like the bladder has not emptied completely.  Difficulty starting urination.  Getting up frequently at night to urinate.  Urinating more frequently during the day. HOME CARE INSTRUCTIONS  Monitor your prostatic hyperplasia for any changes. The following actions may help to alleviate any discomfort you are experiencing:  Give yourself time when you urinate.  Stay away from alcohol.  Avoid beverages containing caffeine, such as coffee, tea, and colas, because they can make the problem worse.  Avoid decongestants, antihistamines, and some prescription medicines that can make the problem worse.  Follow up with your health care provider for further treatment as recommended. SEEK MEDICAL CARE IF:  You are experiencing progressive difficulty voiding.  Your urine stream is progressively getting narrower.  You are awaking from sleep with the urge to void more frequently.  You are constantly feeling the need to void.  You experience loss of urine, especially in small amounts. SEEK IMMEDIATE MEDICAL CARE IF:   You develop increased pain with urination or are unable to urinate.  You develop severe abdominal pain, vomiting, a high fever, or fainting.  You develop back pain or blood in your urine. MAKE SURE YOU:    Understand these instructions.  Will watch your condition.  Will get help right away if you are not doing well or get worse. Document Released: 11/30/2005 Document Revised: 08/02/2013 Document Reviewed: 05/02/2013 [facility] Patient Information 2015 [place], [place]. This information is not intended to replace advice given to you by your health care provider. Make sure you discuss any questions you have with your health care provider.

## 2015-04-08 NOTE — Progress Notes (Signed)
Subjective:    Patient ID: Christopher Summers, male    DOB: 1941-07-10, 74 y.o.   MRN: [id number]  Chief Complaint  Patient presents with  . Establish Care    Urinary frequency, says that when he needs to go to the bathroom he needs to go in a rush and when he gets to the bathroom he has to stand there for a long time before he is able to urinate    HPI:  Christopher Summers is a 74 y.o. male with a PMH of Alzheimers, anxiety, BPH, depression and hypertension who presents today for an office visit. His wife is present for the office visit and provides the history as the patient has Alzheimer's disease. Wife also notes   1) Urinary frequency - Associated symptom of urinary frequency has been going on for quite a while, however worsening lately. Indicates that when he does get to the bathroom he has to stand there for a long time before he is able to go. Indicates that he has to get up about every couple of hours to go to the bathroom and at least once per night. The timing of the symptoms are consistent throughout the day. There are no modifying factors that make it better or worse.    Allergies  Allergen Reactions  . Niacin And Related Other (See Comments)    Turned red all over  . Terazosin     From [facility]    Current Outpatient Prescriptions on File Prior to Visit  Medication Sig Dispense Refill  . donepezil (ARICEPT) 10 MG tablet Take 10 mg by mouth at bedtime.     . hydrOXYzine (ATARAX/VISTARIL) 10 MG tablet Take 10 mg by mouth 3 (three) times daily as needed.    Marland Kitchen LORazepam (ATIVAN) 0.5 MG tablet Take 0.25 mg by mouth every 8 (eight) hours.    . memantine (NAMENDA) 10 MG tablet Take 10 mg by mouth daily.    . sertraline (ZOLOFT) 50 MG tablet Take 50 mg by mouth daily.    . traZODone (DESYREL) 50 MG tablet Take 50 mg by mouth at bedtime.     No current facility-administered medications on file prior to visit.    Past Medical History  Diagnosis Date  . BPH (benign prostatic  hypertrophy)   . Depression   . Essential hypertension, benign   . Reflux   . Dementia     Past Surgical History  Procedure Laterality Date  . Transurethral resection of prostate    . Hip arthroplasty Right 10/09/2014    Procedure: Right Hemiarthroplasty;  Surgeon: Rozanna Box, MD;  Location: [place];  Service: Orthopedics;  Laterality: Right;    Family History  Problem Relation Age of Onset  . Heart disease Brother   . Heart attack Father     History   Social History  . Marital Status: Married    Spouse Name: N/A  . Number of Children: 2  . Years of Education: 11   Occupational History  . Retired    Social History Main Topics  . Smoking status: Former Smoker -- 0.50 packs/day    Types: Cigarettes    Quit date: 10/09/2014  . Smokeless tobacco: Never Used  . Alcohol Use: No  . Drug Use: No  . Sexual Activity: Not on file   Other Topics Concern  . Not on file   Social History Narrative   Fun: Likes to stay at home.   Denies any religious beliefs effecting health  care.     Review of Systems  Constitutional: Negative for fever and chills.  Genitourinary: Positive for urgency and frequency. Negative for dysuria and flank pain.      Objective:    BP 140/84 mmHg  Pulse 57  Temp(Src) 97.7 F (36.5 C) (Oral)  Resp 18  Ht 5\' 11"  (1.803 m)  Wt 164 lb (74.39 kg)  BMI 22.88 kg/m2  SpO2 99% Nursing note and vital signs reviewed.  Physical Exam  Constitutional: He appears well-developed and well-nourished. No distress.  Cardiovascular: Normal rate, regular rhythm, normal heart sounds and intact distal pulses.   Pulmonary/Chest: Effort normal and breath sounds normal.  Neurological: He is alert. He is disoriented.  Skin: Skin is warm and dry.  Psychiatric: He has a normal mood and affect. His behavior is normal. Judgment and thought content normal.       Assessment & Plan:

## 2015-05-03 ENCOUNTER — Other Ambulatory Visit: Payer: Self-pay | Admitting: Family

## 2015-06-08 IMAGING — CR DG CHEST 1V
1 series · 1 of 1 positions shown · non-contrast
Comparison: Single view of the chest 10/08/2014.

CLINICAL DATA: Status post fall. Right hip pain. Altered mental
status.

EXAM:
CHEST - 1 VIEW

[t chest supine]
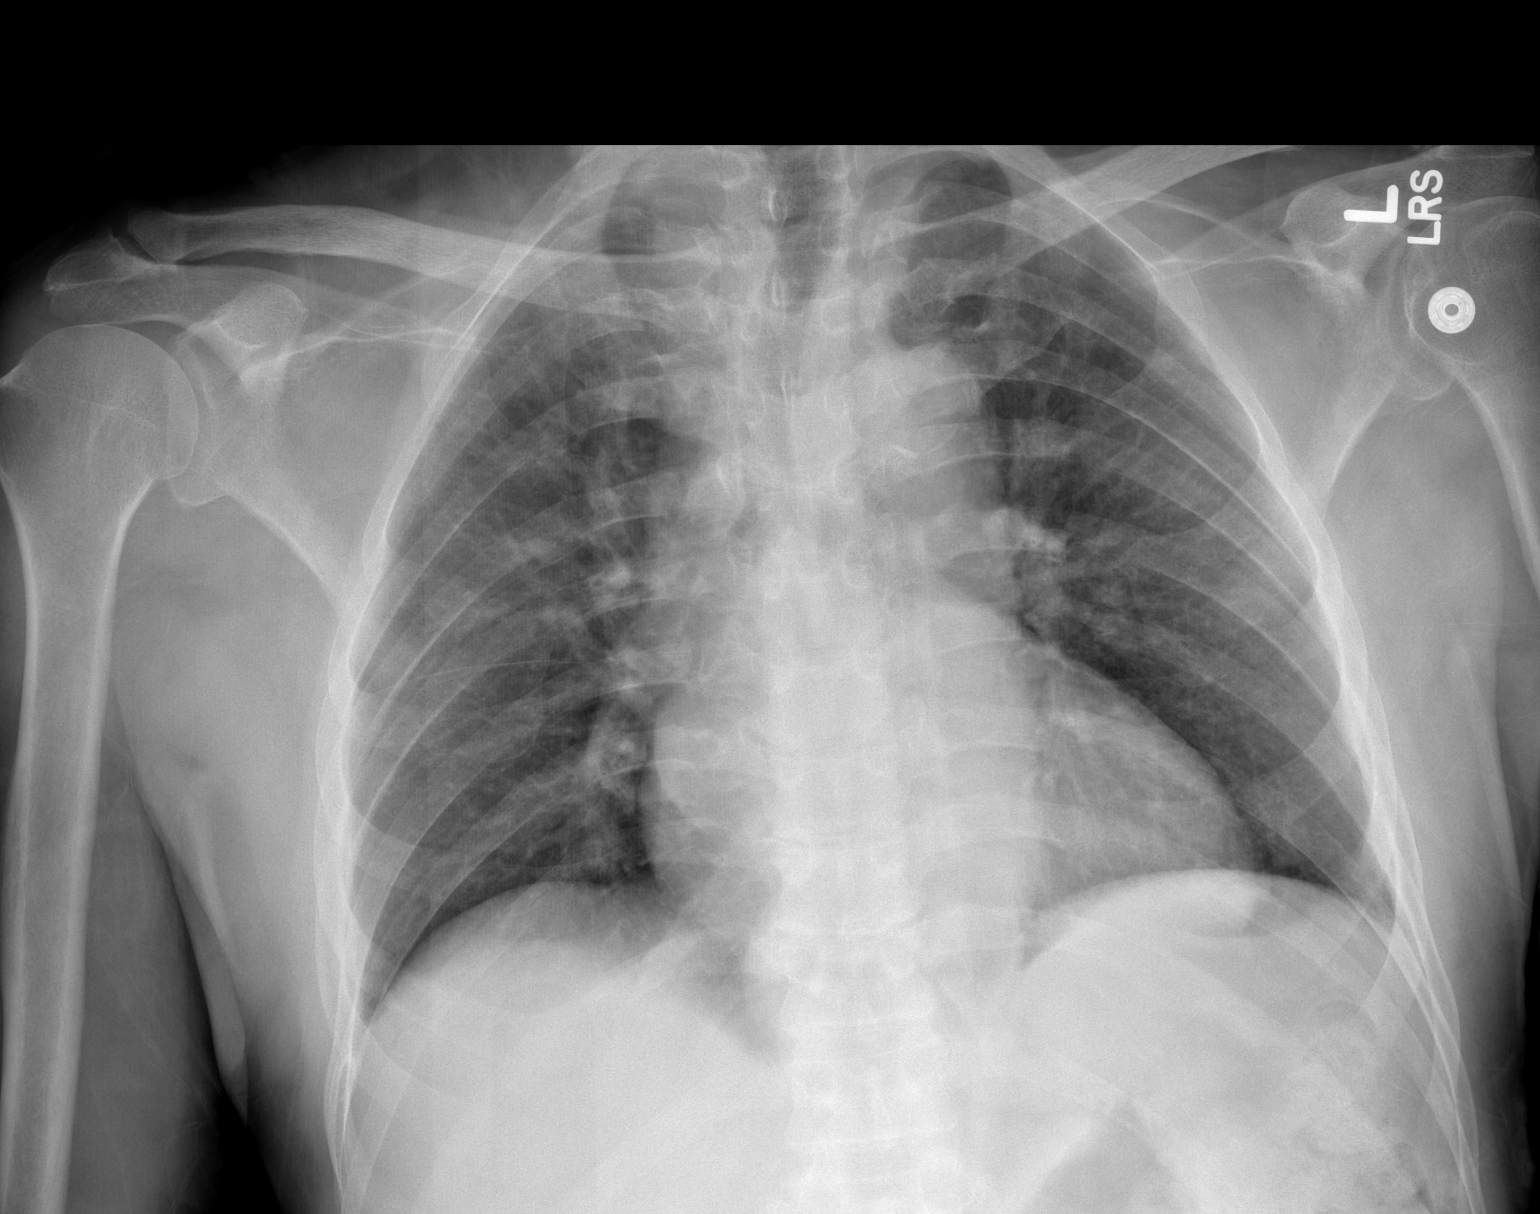

[1 of 1 positions shown; findings below may reference images not displayed]

FINDINGS: The lungs are clear. Lung volumes are lower than on the comparison
study. No pneumothorax or pleural effusion is identified. Heart size
is normal. No focal bony abnormality is identified.
IMPRESSION: No acute abnormality.

## 2015-10-16 ENCOUNTER — Other Ambulatory Visit (HOSPITAL_COMMUNITY)
Admission: RE | Admit: 2015-10-16 | Discharge: 2015-10-16 | Disposition: A | Discharge status: Home / Self Care | Payer: Medicare Other | Source: Ambulatory Visit | Attending: Otolaryngology | Admitting: Otolaryngology

## 2015-10-16 ENCOUNTER — Other Ambulatory Visit: Payer: Self-pay | Admitting: Otolaryngology

## 2015-10-16 DIAGNOSIS — D110 Benign neoplasm of parotid gland: Secondary | ICD-10-CM | POA: Insufficient documentation

## 2015-10-16 DIAGNOSIS — K119 Disease of salivary gland, unspecified: Secondary | ICD-10-CM | POA: Diagnosis not present

## 2015-11-22 ENCOUNTER — Telehealth: Payer: Self-pay

## 2015-11-22 NOTE — Telephone Encounter (Signed)
Call to introduce AWV; Spoke to wife who stated now is not a good time but can call back next week;

## 2015-11-28 NOTE — Telephone Encounter (Signed)
Call to fup regarding AWv. STates Christopher Summers does have Alz; is not to stressful as yet and helps him with am care etc. Asked about sending information regarding the Alzheimer's Asso and would really appreciate it. Will defer AWV at this time.

## 2016-02-19 DIAGNOSIS — S72001D Fracture of unspecified part of neck of right femur, subsequent encounter for closed fracture with routine healing: Secondary | ICD-10-CM | POA: Diagnosis not present

## 2016-04-12 ENCOUNTER — Ambulatory Visit (HOSPITAL_COMMUNITY)
Admission: EM | Admit: 2016-04-12 | Discharge: 2016-04-12 | Disposition: A | Discharge status: Home / Self Care | Payer: Medicare Other | Attending: Emergency Medicine | Admitting: Emergency Medicine

## 2016-04-12 ENCOUNTER — Encounter (HOSPITAL_COMMUNITY): Payer: Self-pay | Admitting: *Deleted

## 2016-04-12 DIAGNOSIS — K219 Gastro-esophageal reflux disease without esophagitis: Secondary | ICD-10-CM | POA: Diagnosis not present

## 2016-04-12 DIAGNOSIS — G309 Alzheimer's disease, unspecified: Secondary | ICD-10-CM | POA: Insufficient documentation

## 2016-04-12 DIAGNOSIS — I10 Essential (primary) hypertension: Secondary | ICD-10-CM | POA: Diagnosis not present

## 2016-04-12 DIAGNOSIS — F0280 Dementia in other diseases classified elsewhere without behavioral disturbance: Secondary | ICD-10-CM | POA: Insufficient documentation

## 2016-04-12 DIAGNOSIS — Z87891 Personal history of nicotine dependence: Secondary | ICD-10-CM | POA: Insufficient documentation

## 2016-04-12 DIAGNOSIS — F329 Major depressive disorder, single episode, unspecified: Secondary | ICD-10-CM | POA: Insufficient documentation

## 2016-04-12 DIAGNOSIS — Z79899 Other long term (current) drug therapy: Secondary | ICD-10-CM | POA: Diagnosis not present

## 2016-04-12 DIAGNOSIS — L0231 Cutaneous abscess of buttock: Secondary | ICD-10-CM | POA: Diagnosis not present

## 2016-04-12 HISTORY — DX: Alzheimer's disease, unspecified: G30.9

## 2016-04-12 HISTORY — DX: Dementia in other diseases classified elsewhere, unspecified severity, without behavioral disturbance, psychotic disturbance, mood disturbance, and anxiety: F02.80

## 2016-04-12 MED ORDER — LIDOCAINE-EPINEPHRINE (PF) 2 %-1:200000 IJ SOLN
INTRAMUSCULAR | Status: AC
  Filled 2016-04-12: qty 20

## 2016-04-12 MED ORDER — CLINDAMYCIN HCL 300 MG PO CAPS: 300.0000 mg | ORAL_CAPSULE | Freq: Three times a day (TID) | ORAL | Status: DC

## 2016-04-12 NOTE — Discharge Instructions (Signed)
Follow-up with [facility] surgery in several days if it is not getting significantly better. Go to the ER  for change in his mental status, fever above 100.4 or other concerns

## 2016-04-12 NOTE — ED Notes (Signed)
Rx called in to [facility] per wife's request.

## 2016-04-12 NOTE — ED Notes (Signed)
Reports "boil" to buttock x 1 wk; has been draining, but has foul odor and is "hard".  Has been bathing pt daily and applying triple abx oint.  Denies fevers.

## 2016-04-12 NOTE — ED Notes (Signed)
I&D left buttock abscess with assistance.  Pt tolerated fair.

## 2016-04-12 NOTE — ED Provider Notes (Signed)
HPI  SUBJECTIVE:  Christopher Summers is a 75 y.o. male who presents with a painful erythematous mass of gradually increasing size on his medial left buttock for 1 week.   Sx worse with palpation. No alleviating factors. Caregiver has been applying antibiotic ointment to the area. Reports drainage starting several days ago. Has not tried anything for pain. No N/V, fevers.  No bodyaches.  Caregiver does not recall insect bite, trauma to area. No contacts with similar lesions.no lesions like this elsewhere.  -  H/o MRSA skin infections. Past medical history of Alzheimer's,  borderline diabetes, controlled with diet, right hip fracture status post partial right hip replacement. No history of No h/o HTN, artificial heart valves.     Past Medical History  Diagnosis Date  . BPH (benign prostatic hypertrophy)   . Depression   . Essential hypertension, benign   . Reflux   . Dementia   . Alzheimer's disease     Past Surgical History  Procedure Laterality Date  . Transurethral resection of prostate    . Hip arthroplasty Right 10/09/2014    Procedure: Right Hemiarthroplasty;  Surgeon: Rozanna Box, MD;  Location: [facility];  Service: Orthopedics;  Laterality: Right;    Family History  Problem Relation Age of Onset  . Heart disease Brother   . Heart attack Father     Social History  Substance Use Topics  . Smoking status: Former Smoker -- 0.50 packs/day    Types: Cigarettes    Quit date: 10/09/2014  . Smokeless tobacco: Never Used  . Alcohol Use: No    No current facility-administered medications for this encounter.  Current outpatient prescriptions:  .  divalproex (DEPAKOTE) 250 MG DR tablet, Take 250 mg by mouth 3 (three) times daily., Disp: , Rfl:  .  donepezil (ARICEPT) 10 MG tablet, Take 10 mg by mouth at bedtime. , Disp: , Rfl:  .  finasteride (PROSCAR) 5 MG tablet, TAKE 1 TABLET BY MOUTH DAILY, Disp: 30 tablet, Rfl: 5 .  hydrOXYzine (ATARAX/VISTARIL) 10 MG tablet, Take 10 mg by  mouth 3 (three) times daily as needed., Disp: , Rfl:  .  LORazepam (ATIVAN) 0.5 MG tablet, Take 1 mg by mouth every 8 (eight) hours. , Disp: , Rfl:  .  memantine (NAMENDA) 10 MG tablet, Take 10 mg by mouth daily., Disp: , Rfl:  .  QUEtiapine Fumarate (SEROQUEL PO), Take by mouth., Disp: , Rfl:  .  sertraline (ZOLOFT) 50 MG tablet, Take 50 mg by mouth daily., Disp: , Rfl:  .  traZODone (DESYREL) 50 MG tablet, Take 50 mg by mouth at bedtime., Disp: , Rfl:  .  clindamycin (CLEOCIN) 300 MG capsule, Take 1 capsule (300 mg total) by mouth 3 (three) times daily., Disp: 30 capsule, Rfl: 0  Allergies  Allergen Reactions  . Niacin And Related Other (See Comments)    Turned red all over  . Terazosin     From [facility]     ROS  As noted in HPI.   Physical Exam  BP 144/85 mmHg  Pulse 81  Temp(Src) 97.7 F (36.5 C) (Oral)  Resp 16  SpO2 98%  Constitutional: Well developed, well nourished, no acute distress Eyes:  EOMI, conjunctiva normal bilaterally HENT: Normocephalic, atraumatic,mucus membranes moist Respiratory: Normal inspiratory effort Cardiovascular: Normal rate GI: nondistended Skin:4 x 6 cm area of tender induration, erythema with several central ulcerations on the medial left buttock. Marked area of induration with skin marker for reference.  Minimal expressible purulent  drainage. Musculoskeletal: no deformities Neurologic: at baseline mental status per caregiver,  no focal neuro deficits Psychiatric: Speech and behavior appropriate   ED Course   Medications - No data to display  Orders Placed This Encounter  Procedures  . Culture    From left buttock    Standing Status: Standing     Number of Occurrences: 1     Standing Expiration Date:   . Ambulatory referral to General Surgery    Referral Priority:  Urgent    Referral Type:  Surgical    Referral Reason:  Specialty Services Required    Requested Specialty:  General Surgery    Number of Visits Requested:  1    No  results found for this or any previous visit (from the past 24 hour(s)). No results found.  ED Clinical Impression  Abscess of buttock, left   ED Assessment/Plan  Previous results reviewed. No history of MRSA.   Procedure note: Cleaned area with iodine.anesthetized the area using topical anesthetic and 2 cc of lidocaine with epinephrine 2%. Made a single linear incision, explored the wound with forceps to break up loculations, obtained cultures. Minimal purulent drainage was obtained. Wound was then irrigated with 120 cc of sterile saline. Pressure dressing applied. Patient tolerated procedure well.   Presentation consistent with abscess versus pilonidal cyst.  We'll place him on Bactrim, have him follow-up with surgery if not getting significantly better in several days. Ordered urgent general surgery referral and gave caregiver the number to [facility] surgery. Discussed medical decision-making, plan for follow-up with caregiver. She agrees with plan.  *This clinic note was created using Dragon dictation software. Therefore, there may be occasional mistakes despite careful proofreading.  ?     Melynda Ripple, MD 04/12/16 [phone]

## 2016-04-15 LAB — WOUND CULTURE

## 2016-04-24 DIAGNOSIS — R402411 Glasgow coma scale score 13-15, in the field [EMT or ambulance]: Secondary | ICD-10-CM | POA: Diagnosis not present

## 2016-04-24 DIAGNOSIS — G309 Alzheimer's disease, unspecified: Secondary | ICD-10-CM | POA: Diagnosis not present

## 2016-06-15 ENCOUNTER — Ambulatory Visit (INDEPENDENT_AMBULATORY_CARE_PROVIDER_SITE_OTHER): Payer: Medicare Other | Admitting: Family

## 2016-06-15 ENCOUNTER — Encounter: Payer: Self-pay | Admitting: Family

## 2016-06-15 VITALS — BP 140/82 | HR 60 | Temp 97.7°F | Resp 14 | Ht 71.0 in | Wt 159.1 lb

## 2016-06-15 DIAGNOSIS — Z23 Encounter for immunization: Secondary | ICD-10-CM

## 2016-06-15 DIAGNOSIS — L02412 Cutaneous abscess of left axilla: Secondary | ICD-10-CM | POA: Diagnosis not present

## 2016-06-15 MED ORDER — LORATADINE 10 MG PO TABS: 10.0000 mg | ORAL_TABLET | Freq: Every day | ORAL | Status: DC

## 2016-06-15 MED ORDER — DOXYCYCLINE HYCLATE 100 MG PO TABS: 100.0000 mg | ORAL_TABLET | Freq: Two times a day (BID) | ORAL | Status: DC

## 2016-06-15 NOTE — Progress Notes (Signed)
Pre visit review using our clinic review tool, if applicable. No additional management support is needed unless otherwise documented below in the visit note. 

## 2016-06-15 NOTE — Progress Notes (Signed)
Subjective:    Patient ID: Christopher Summers, male    DOB: [birth year], 75 y.o.   MRN: [id number]  Chief Complaint  Patient presents with  . Establish Care    has a cyst that pops up under his arm and on his buttocks     HPI:  Christopher Summers is a 75 y.o. male who  has a past medical history of BPH (benign prostatic hypertrophy); Depression; Essential hypertension, benign; Reflux; Dementia; and Alzheimer's disease. and presents today for an office visit. He has a care giver present as he has a significant history of Alzheimer's disease.   1.) Multiple cysts - This is a new problem. History of MRSA related cysts and most recently seen in Urgent Care and had an incision and drainage of of a cyst located on his buttocks that was incised and drained and treated with antibiotics. The presentation was consistent with either an abscess or a pilonidal cyst. He completed the antibiotics as prescribed. Now has two cysts located on his buttock and located under his left arm that has been going on for about 2 weeks. Denies any fevers. Denies any additional confusion.   Allergies  Allergen Reactions  . Niacin And Related Other (See Comments)    Turned red all over  . Terazosin     From [facility]     Current Outpatient Prescriptions on File Prior to Visit  Medication Sig Dispense Refill  . divalproex (DEPAKOTE) 250 MG DR tablet Take 250 mg by mouth 3 (three) times daily.    Marland Kitchen donepezil (ARICEPT) 10 MG tablet Take 10 mg by mouth at bedtime.     . finasteride (PROSCAR) 5 MG tablet TAKE 1 TABLET BY MOUTH DAILY 30 tablet 5  . hydrOXYzine (ATARAX/VISTARIL) 10 MG tablet Take 10 mg by mouth 3 (three) times daily as needed.    Marland Kitchen LORazepam (ATIVAN) 0.5 MG tablet Take 1 mg by mouth every 8 (eight) hours.     . memantine (NAMENDA) 10 MG tablet Take 10 mg by mouth daily.    . QUEtiapine Fumarate (SEROQUEL PO) Take by mouth.    . sertraline (ZOLOFT) 50 MG tablet Take 50 mg by mouth daily.    . traZODone  (DESYREL) 50 MG tablet Take 50 mg by mouth at bedtime.     No current facility-administered medications on file prior to visit.    Past Medical History  Diagnosis Date  . BPH (benign prostatic hypertrophy)   . Depression   . Essential hypertension, benign   . Reflux   . Dementia   . Alzheimer's disease      Review of Systems  Constitutional: Negative for fever and chills.  Skin: Negative for color change and wound.       Positive for abscess of left axilla.      Objective:    BP 140/82 mmHg  Pulse 60  Temp(Src) 97.7 F (36.5 C) (Oral)  Resp 14  Ht 5\' 11"  (1.803 m)  Wt 159 lb 1.9 oz (72.176 kg)  BMI 22.20 kg/m2  SpO2 99% Nursing note and vital signs reviewed.  Physical Exam  Constitutional: He is oriented to person, place, and time. He appears well-developed and well-nourished. No distress.  Cardiovascular: Normal rate, regular rhythm, normal heart sounds and intact distal pulses.   Pulmonary/Chest: Effort normal and breath sounds normal.  Neurological: He is alert and oriented to person, place, and time.  Skin: Skin is warm and dry.  Approximately 2 in x 1 inch  oblong raised area with redness and tender to the touch. No discharge.  Unable to view posterior lesion secondary to patient unwillingness to cooperate.   Psychiatric: He has a normal mood and affect. His behavior is normal. Judgment and thought content normal.       Assessment & Plan:   Problem List Items Addressed This Visit      Other   Abscess of axilla, left    Symptoms and exam consistent with infective abscess of the left axilla. Given Alzheimer's disease and patient unwillingness to cooperate, there is concern for incision and drainage in the office. Treat conservatively with doxycycline. Refer to general surgery for follow-up if symptoms worsen or do not improve with antibiotic therapy. Basic wound care instructions provided. Return precautions reviewed with caregiver.      Relevant Medications     doxycycline (VIBRA-TABS) 100 MG tablet   Other Relevant Orders   Ambulatory referral to General Surgery    Other Visit Diagnoses    Need for diphtheria-tetanus-pertussis (Tdap) vaccine, adult/adolescent    -  Primary    Relevant Orders    Tdap vaccine greater than or equal to 7yo IM (Completed)        I have discontinued Mr. Bixler clindamycin. I am also having him start on doxycycline and loratadine. Additionally, I am having him maintain his memantine, donepezil, sertraline, LORazepam, hydrOXYzine, traZODone, finasteride, divalproex, and QUEtiapine Fumarate (SEROQUEL PO).   Meds ordered this encounter  Medications  . doxycycline (VIBRA-TABS) 100 MG tablet    Sig: Take 1 tablet (100 mg total) by mouth 2 (two) times daily.    Dispense:  20 tablet    Refill:  0    Order Specific Question:  Supervising Provider    Answer:  Pricilla Holm A [id number]  . loratadine (CLARITIN) 10 MG tablet    Sig: Take 1 tablet (10 mg total) by mouth daily.    Dispense:  30 tablet    Refill:  0    Order Specific Question:  Supervising Provider    Answer:  Pricilla Holm A [id number]     Follow-up: Return if symptoms worsen or fail to improve.  Mauricio Po, FNP

## 2016-06-15 NOTE — Patient Instructions (Addendum)
Thank you for choosing [facility].  Summary/Instructions:  Your prescription(s) have been submitted to your pharmacy or been printed and provided for you. Please take as directed and contact our office if you believe you are having problem(s) with the medication(s) or have any questions.  If your symptoms worsen or fail to improve, please contact our office for further instruction, or in case of emergency go directly to the emergency room at the closest medical facility.   Please keep clean with soap and water.   They will call to schedule your appointment with general surgery.  Claratin as needed for seasonal allergies.   Abscess An abscess is an infected area that contains a collection of pus and debris.It can occur in almost any part of the body. An abscess is also known as a furuncle or boil. CAUSES  An abscess occurs when tissue gets infected. This can occur from blockage of oil or sweat glands, infection of hair follicles, or a minor injury to the skin. As the body tries to fight the infection, pus collects in the area and creates pressure under the skin. This pressure causes pain. People with weakened immune systems have difficulty fighting infections and get certain abscesses more often.  SYMPTOMS Usually an abscess develops on the skin and becomes a painful mass that is red, warm, and tender. If the abscess forms under the skin, you may feel a moveable soft area under the skin. Some abscesses break open (rupture) on their own, but most will continue to get worse without care. The infection can spread deeper into the body and eventually into the bloodstream, causing you to feel ill.  DIAGNOSIS  Your caregiver will take your medical history and perform a physical exam. A sample of fluid may also be taken from the abscess to determine what is causing your infection. TREATMENT  Your caregiver may prescribe antibiotic medicines to fight the infection. However, taking antibiotics  alone usually does not cure an abscess. Your caregiver may need to make a small cut (incision) in the abscess to drain the pus. In some cases, gauze is packed into the abscess to reduce pain and to continue draining the area. HOME CARE INSTRUCTIONS   Only take over-the-counter or prescription medicines for pain, discomfort, or fever as directed by your caregiver.  If you were prescribed antibiotics, take them as directed. Finish them even if you start to feel better.  If gauze is used, follow your caregiver's directions for changing the gauze.  To avoid spreading the infection:  Keep your draining abscess covered with a bandage.  Wash your hands well.  Do not share personal care items, towels, or whirlpools with others.  Avoid skin contact with others.  Keep your skin and clothes clean around the abscess.  Keep all follow-up appointments as directed by your caregiver. SEEK MEDICAL CARE IF:   You have increased pain, swelling, redness, fluid drainage, or bleeding.  You have muscle aches, chills, or a general ill feeling.  You have a fever. MAKE SURE YOU:   Understand these instructions.  Will watch your condition.  Will get help right away if you are not doing well or get worse.   This information is not intended to replace advice given to you by your health care provider. Make sure you discuss any questions you have with your health care provider.   Document Released: 09/09/2005 Document Revised: 05/31/2012 Document Reviewed: 02/12/2012 Elsevier Interactive Patient Education [employer].

## 2016-06-15 NOTE — Assessment & Plan Note (Signed)
Symptoms and exam consistent with infective abscess of the left axilla. Given Alzheimer's disease and patient unwillingness to cooperate, there is concern for incision and drainage in the office. Treat conservatively with doxycycline. Refer to general surgery for follow-up if symptoms worsen or do not improve with antibiotic therapy. Basic wound care instructions provided. Return precautions reviewed with caregiver.

## 2016-06-30 ENCOUNTER — Ambulatory Visit (INDEPENDENT_AMBULATORY_CARE_PROVIDER_SITE_OTHER): Payer: Medicare Other | Admitting: Internal Medicine

## 2016-06-30 ENCOUNTER — Encounter: Payer: Self-pay | Admitting: Internal Medicine

## 2016-06-30 VITALS — BP 134/64 | HR 64 | Temp 98.1°F | Resp 12 | Ht 71.0 in | Wt 161.1 lb

## 2016-06-30 DIAGNOSIS — M79605 Pain in left leg: Secondary | ICD-10-CM | POA: Insufficient documentation

## 2016-06-30 DIAGNOSIS — G309 Alzheimer's disease, unspecified: Principal | ICD-10-CM

## 2016-06-30 DIAGNOSIS — G308 Other Alzheimer's disease: Secondary | ICD-10-CM | POA: Diagnosis not present

## 2016-06-30 DIAGNOSIS — F02818 Dementia in other diseases classified elsewhere, unspecified severity, with other behavioral disturbance: Secondary | ICD-10-CM

## 2016-06-30 DIAGNOSIS — F0281 Dementia in other diseases classified elsewhere with behavioral disturbance: Secondary | ICD-10-CM

## 2016-06-30 MED ORDER — DICLOFENAC SODIUM 1 % TD GEL: 2.0000 g | Freq: Four times a day (QID) | TRANSDERMAL | Status: DC

## 2016-06-30 NOTE — Patient Instructions (Signed)
We have sent in a gel medicine for the pain in the leg which you can rub on it for pain up to 3 times per day.   It is okay to give him 2 ibuprofen pills in the morning or 2 tylenol pills in the morning to help with pain.   You can also try a heating pad on his leg for the pain.

## 2016-06-30 NOTE — Assessment & Plan Note (Signed)
Okay to use tylenol, rx for voltaren gel. No indication for imaging today.

## 2016-06-30 NOTE — Assessment & Plan Note (Signed)
Stable and appears to be well cared for.

## 2016-06-30 NOTE — Progress Notes (Signed)
   Subjective:    Patient ID: Christopher Summers, male    DOB: 1941-02-14, 75 y.o.   MRN: [id number]  HPI The patient is a 75 YO man coming in for leg pain. He has severe dementia and is not a reliable historian. He told his caregiver that his right leg was hurting then he was complaining of left leg pain. Now he is not having any problems at this time. No changes to his medications recently. No injury to the area. No skin color changes or rash.   Review of Systems  Unable to perform ROS: Dementia  Constitutional: Negative.   Respiratory: Negative.   Cardiovascular: Negative.   Gastrointestinal: Negative.   Musculoskeletal: Positive for arthralgias. Negative for myalgias, joint swelling and gait problem.      Objective:   Physical Exam  Constitutional: He appears well-developed and well-nourished.  HENT:  Head: Normocephalic and atraumatic.  Cardiovascular: Normal rate and regular rhythm.   Pulmonary/Chest: Effort normal and breath sounds normal.  Abdominal: Soft. He exhibits no distension. There is no tenderness. There is no rebound.  Musculoskeletal: He exhibits no edema or tenderness.  No pain in the left leg or knee, no swelling or calf tenderness, no skin changes  Neurological: He is alert.  Oriented times 1, not able to follow 2 step command   Filed Vitals:   06/30/16 1609  BP: 134/64  Pulse: 64  Temp: 98.1 F (36.7 C)  TempSrc: Oral  Resp: 12  Height: 5\' 11"  (1.803 m)  Weight: 161 lb 1.9 oz (73.084 kg)  SpO2: 93%      Assessment & Plan:

## 2016-06-30 NOTE — Progress Notes (Signed)
Pre visit review using our clinic review tool, if applicable. No additional management support is needed unless otherwise documented below in the visit note. 

## 2016-07-01 ENCOUNTER — Telehealth: Payer: Self-pay

## 2016-07-01 NOTE — Telephone Encounter (Signed)
PA initiated and APPROVED via CoverMyMeds key [id number]

## 2016-07-07 ENCOUNTER — Ambulatory Visit (HOSPITAL_COMMUNITY)
Admission: EM | Admit: 2016-07-07 | Discharge: 2016-07-07 | Disposition: A | Discharge status: Home / Self Care | Payer: Medicare Other | Attending: Internal Medicine | Admitting: Internal Medicine

## 2016-07-07 ENCOUNTER — Other Ambulatory Visit (HOSPITAL_COMMUNITY): Payer: Self-pay | Admitting: Radiology

## 2016-07-07 ENCOUNTER — Ambulatory Visit (INDEPENDENT_AMBULATORY_CARE_PROVIDER_SITE_OTHER): Payer: Medicare Other

## 2016-07-07 ENCOUNTER — Encounter (HOSPITAL_COMMUNITY): Payer: Self-pay | Admitting: *Deleted

## 2016-07-07 DIAGNOSIS — R109 Unspecified abdominal pain: Secondary | ICD-10-CM | POA: Diagnosis not present

## 2016-07-07 DIAGNOSIS — K5900 Constipation, unspecified: Secondary | ICD-10-CM

## 2016-07-07 LAB — POCT URINALYSIS DIP (DEVICE)
BILIRUBIN URINE: NEGATIVE
Glucose, UA: NEGATIVE mg/dL
KETONES UR: NEGATIVE mg/dL
Leukocytes, UA: NEGATIVE
Nitrite: NEGATIVE
PH: 6 (ref 5.0–8.0)
PROTEIN: 100 mg/dL — AB
Specific Gravity, Urine: >=1.03 (ref 1.005–1.030)
Urobilinogen, UA: 0.2 mg/dL (ref 0.0–1.0)

## 2016-07-07 MED ORDER — POLYETHYLENE GLYCOL 3350 17 G PO PACK: 17.0000 g | PACK | Freq: Every day | ORAL | 0 refills | Status: AC

## 2016-07-07 NOTE — Discharge Instructions (Signed)
Urine test did not suggest UTI today.  Abdominal xray suggests constipation.  Pres[facility] [address] [facility].  Take 1 dose in a glass of orange juice or other liquid once or twice daily until stools are soft and pass easily.  Recheck or followup with primary care provider Mauricio Po if not improving in several days.

## 2016-07-07 NOTE — ED Provider Notes (Signed)
[facility]    CSN: [id number] Arrival date & time: 07/07/16  1439  First Provider Contact:  None       History   Chief Complaint Chief Complaint  Patient presents with  . Weakness    HPI Christopher Summers is a 75 y.o. male with history of dementia. He presents today with 7-10 day history of drowsiness, less alert than usual. Today reported some abdominal discomfort, this appeared to be episodic, and was accompanied by a very hard bowel movement according to his wife. Appetite has been good. Did have some dry heaving after eating today at daycare.  Not coughing, no fever. No change in bladder habits. Recently reported left leg discomfort, and evaluation was unrevealing for this.  HPI  Past Medical History:  Diagnosis Date  . Alzheimer's disease   . BPH (benign prostatic hypertrophy)   . Dementia   . Depression   . Essential hypertension, benign   . Reflux     Patient Active Problem List   Diagnosis Date Noted  . Left leg pain 06/30/2016  . Abscess of axilla, left 06/15/2016  . Anxiety state 10/26/2014  . 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 Acute blood loss anemia 10/15/2014  . Hip fracture, right ([place]) 10/08/2014  . Alzheimer's dementia with behavioral disturbance 10/08/2014  . Preoperative evaluation 10/08/2014  . Major depressive disorder, recurrent episode ([place]) 10/05/2007  . HYPERTENSION, BENIGN 10/05/2007  . CYSTITIS, ACUTE 10/05/2007  . BPH (benign prostatic hyperplasia) 10/05/2007    Past Surgical History:  Procedure Laterality Date  . HIP ARTHROPLASTY Right 10/09/2014   Procedure: Right Hemiarthroplasty;  Surgeon: Rozanna Box, MD;  Location: [place];  Service: Orthopedics;  Laterality: Right;  . TRANSURETHRAL RESECTION OF PROSTATE         Home Medications    Prior to Admission medications   Medication Sig Start Date End Date Taking? Authorizing Provider  diclofenac sodium (VOLTAREN) 1 % GEL Apply 2 g topically 4 (four) times  daily. 06/30/16   Hoyt Koch, MD  divalproex (DEPAKOTE) 250 MG DR tablet Take 250 mg by mouth daily.     Historical Provider, MD  donepezil (ARICEPT) 10 MG tablet Take 10 mg by mouth at bedtime.     Historical Provider, MD  doxycycline (VIBRA-TABS) 100 MG tablet Take 1 tablet (100 mg total) by mouth 2 (two) times daily. Patient not taking: Reported on 06/30/2016 06/15/16   Golden Circle, FNP  finasteride (PROSCAR) 5 MG tablet TAKE 1 TABLET BY MOUTH DAILY 05/03/15   Golden Circle, FNP  hydrOXYzine (ATARAX/VISTARIL) 10 MG tablet Take 10 mg by mouth 2 (two) times daily.     Historical Provider, MD  loratadine (CLARITIN) 10 MG tablet Take 1 tablet (10 mg total) by mouth daily. 06/15/16   Golden Circle, FNP  LORazepam (ATIVAN) 0.5 MG tablet Take 1 mg by mouth every 8 (eight) hours.     Historical Provider, MD  memantine (NAMENDA) 10 MG tablet Take 10 mg by mouth daily.    Historical Provider, MD  QUEtiapine Fumarate (SEROQUEL PO) Take by mouth.    Historical Provider, MD  sertraline (ZOLOFT) 50 MG tablet Take 50 mg by mouth daily.    Historical Provider, MD  traZODone (DESYREL) 50 MG tablet Take 50 mg by mouth at bedtime.    Historical Provider, MD    Family History Family History  Problem Relation Age of Onset  . Heart disease Brother   . Heart attack Father  Social History Social History  Substance Use Topics  . Smoking status: Former Smoker    Packs/day: 0.50    Types: Cigarettes    Quit date: 10/09/2014  . Smokeless tobacco: Never Used  . Alcohol use No     Allergies   Niacin and related and Terazosin   Review of Systems Review of Systems  All other systems reviewed and are negative.    Physical Exam Triage Vital Signs ED Triage Vitals [07/07/16 1527]  Enc Vitals Group     BP 167/90     Pulse Rate 78     Resp 18     Temp 98.9 F (37.2 C)     Temp Source Oral     SpO2 97 %     Weight 165 lb (74.8 kg)     Height 5\' 11"  (1.803 m)    Updated Vital  Signs BP 167/90 (BP Location: Left Arm)   Pulse 78   Temp 98.9 F (37.2 C) (Oral)   Resp 18   Ht 5\' 11"  (1.803 m)   Wt 165 lb (74.8 kg)   SpO2 97%   BMI 23.01 kg/m  Physical Exam  Constitutional: He appears well-developed and well-nourished.  Sitting up on the end of the exam table, appears to be dozing, not interactive.  HENT:  Head: Normocephalic and atraumatic.  Eyes: Conjunctivae are normal.  Neck: Neck supple.  Cardiovascular: Normal rate and regular rhythm.   No murmur heard. Pulmonary/Chest: Effort normal and breath sounds normal. No respiratory distress.  Abdominal: Soft. There is no tenderness.  Difficult abdominal exam, will not fully recline and abdominal muscles are poorly relaxed  Musculoskeletal: He exhibits no edema.  Neurological: He is alert.  Skin: Skin is warm and dry.  Psychiatric: He has a normal mood and affect.  Nursing note and vitals reviewed.    UC Treatments / Results  Labs (all labs ordered are listed, but only abnormal results are displayed)  Results for orders placed or performed during the hospital encounter of 07/07/16  POCT urinalysis dip (device)  Result Value Ref Range   Glucose, UA NEGATIVE NEGATIVE mg/dL   Bilirubin Urine NEGATIVE NEGATIVE   Ketones, ur NEGATIVE NEGATIVE mg/dL   Specific Gravity, Urine >=1.030 1.005 - 1.030   Hgb urine dipstick MODERATE (A) NEGATIVE   pH 6.0 5.0 - 8.0   Protein, ur 100 (A) NEGATIVE mg/dL   Urobilinogen, UA 0.2 0.0 - 1.0 mg/dL   Nitrite NEGATIVE NEGATIVE   Leukocytes, UA NEGATIVE NEGATIVE    EKG  EKG Interpretation None       Radiology No results found.  Procedures Procedures (including critical care time) none  Medications Ordered in UC Medications - No data to display    Final Clinical Impressions(s) / UC Diagnoses   Final diagnoses:  Constipation, unspecified constipation type    New Prescriptions Discharge Medication List as of 07/07/2016  5:09 PM    START taking  these medications   Details  polyethylene glycol (MIRALAX / GLYCOLAX) packet Take 17 g by mouth daily., Starting Tue 07/07/2016, Normal         Sherlene Shams, MD 07/16/16 1257

## 2016-10-06 ENCOUNTER — Telehealth: Payer: Self-pay | Admitting: *Deleted

## 2016-10-06 DIAGNOSIS — L02412 Cutaneous abscess of left axilla: Secondary | ICD-10-CM

## 2016-10-06 MED ORDER — DOXYCYCLINE HYCLATE 100 MG PO TABS: 100.0000 mg | ORAL_TABLET | Freq: Two times a day (BID) | ORAL | 0 refills | Status: DC

## 2016-10-06 NOTE — Telephone Encounter (Signed)
Rec'd call from pt wife she states pt saw Marya Amsler back in June or July for a boil. The boil has came back, and wanting to see if [facility] could [facility]

## 2016-10-06 NOTE — Telephone Encounter (Signed)
Medication sent to pharmacy  

## 2016-10-06 NOTE — Telephone Encounter (Signed)
Notified pt wife [facility].../[facility]

## 2016-10-12 DIAGNOSIS — L300 Nummular dermatitis: Secondary | ICD-10-CM | POA: Diagnosis not present

## 2016-10-19 ENCOUNTER — Encounter: Payer: Self-pay | Admitting: Family

## 2016-10-19 ENCOUNTER — Ambulatory Visit (INDEPENDENT_AMBULATORY_CARE_PROVIDER_SITE_OTHER): Payer: Medicare Other | Admitting: Family

## 2016-10-19 DIAGNOSIS — M79604 Pain in right leg: Secondary | ICD-10-CM

## 2016-10-19 NOTE — Assessment & Plan Note (Signed)
Right leg pain consistent with strain of the rectus femoris. Treat conservatively with over-the-counter medications as needed for symptom relief and supportive care, home exercise therapy, and ice. Follow-up if symptoms worsen or fail to improve or further imaging and/or physical therapy.

## 2016-10-19 NOTE — Progress Notes (Signed)
Subjective:    Patient ID: Christopher Summers, male    DOB: 1941/11/28, 75 y.o.   MRN: [id number]  Chief Complaint  Patient presents with  . Knee Pain    right knee and thigh pain, x3 weeks has trouble lifting his right knee up to get in the car or stand up, when walking it is better    HPI:  Christopher Summers is a 75 y.o. male who  has a past medical history of Alzheimer's disease; BPH (benign prostatic hypertrophy); Dementia; Depression; Essential hypertension, benign; and Reflux. and presents today for an office visit. The history is provided by his wife secondary to his Alzheimer's disease.   This is a new problem. Associated symptom of pain located in his right knee and thigh have been going on for about 3 weeks. Describes difficulty when lifting his knee up or when attempting to get in the car. He is able to walk without difficulty.   Allergies  Allergen Reactions  . Niacin And Related Other (See Comments)    Turned red all over  . Terazosin     From [facility]      Outpatient Medications Prior to Visit  Medication Sig Dispense Refill  . diclofenac sodium (VOLTAREN) 1 % GEL Apply 2 g topically 4 (four) times daily. 100 g 6  . divalproex (DEPAKOTE) 250 MG DR tablet Take 250 mg by mouth daily.     Marland Kitchen donepezil (ARICEPT) 10 MG tablet Take 10 mg by mouth at bedtime.     . finasteride (PROSCAR) 5 MG tablet TAKE 1 TABLET BY MOUTH DAILY 30 tablet 5  . hydrOXYzine (ATARAX/VISTARIL) 10 MG tablet Take 10 mg by mouth 2 (two) times daily.     Marland Kitchen loratadine (CLARITIN) 10 MG tablet Take 1 tablet (10 mg total) by mouth daily. 30 tablet 0  . LORazepam (ATIVAN) 0.5 MG tablet Take 1 mg by mouth every 8 (eight) hours.     . memantine (NAMENDA) 10 MG tablet Take 10 mg by mouth daily.    . polyethylene glycol (MIRALAX / GLYCOLAX) packet Take 17 g by mouth daily. 30 each 0  . QUEtiapine Fumarate (SEROQUEL PO) Take by mouth.    . sertraline (ZOLOFT) 50 MG tablet Take 50 mg by mouth daily.    .  traZODone (DESYREL) 50 MG tablet Take 50 mg by mouth at bedtime.    Marland Kitchen doxycycline (VIBRA-TABS) 100 MG tablet Take 1 tablet (100 mg total) by mouth 2 (two) times daily. 20 tablet 0   No facility-administered medications prior to visit.      Review of Systems  Unable to perform ROS: Dementia      Objective:    BP 128/88 (BP Location: Left Arm, Patient Position: Sitting, Cuff Size: Normal)   Pulse 69   Temp 97.7 F (36.5 C) (Oral)   Resp 16   Ht 5\' 11"  (1.803 m)   Wt 163 lb (73.9 kg)   SpO2 (!) 69%   BMI 22.73 kg/m  Nursing note and vital signs reviewed.  Physical Exam  Constitutional: He is oriented to person, place, and time. He appears well-developed and well-nourished. No distress.  Cardiovascular: Normal rate, regular rhythm, normal heart sounds and intact distal pulses.   Pulmonary/Chest: Effort normal and breath sounds normal.  Musculoskeletal:  Right thigh/knee - no obvious deformity, discoloration, or edema noted. No palpable tenderness, crepitus, or deformity present upon exam. Range of motion appears within normal limits. Strength unable to be tested secondary to  patient's mental status. Ligamentous and meniscal testing are negative.  Neurological: He is alert and oriented to person, place, and time.  Skin: Skin is warm and dry.  Psychiatric: He has a normal mood and affect. His behavior is normal. Judgment and thought content normal.       Assessment & Plan:   Problem List Items Addressed This Visit      Other   Right leg pain    Right leg pain consistent with strain of the rectus femoris. Treat conservatively with over-the-counter medications as needed for symptom relief and supportive care, home exercise therapy, and ice. Follow-up if symptoms worsen or fail to improve or further imaging and/or physical therapy.          I have discontinued Mr. Bassin doxycycline. I am also having him maintain his memantine, donepezil, sertraline, LORazepam,  hydrOXYzine, traZODone, finasteride, divalproex, QUEtiapine Fumarate (SEROQUEL PO), loratadine, diclofenac sodium, and polyethylene glycol.   Follow-up: Return if symptoms worsen or fail to improve.  Mauricio Po, FNP

## 2016-10-19 NOTE — Patient Instructions (Addendum)
Thank you for choosing [facility].  SUMMARY AND INSTRUCTIONS:  Ice / moist heat x 20 minutes every 2 hours as needed and after activity.  Stretches and activities daily.   Tylenol as needed for pain.   Follow up if symptoms worsen for possible physical therapy.  Medication:  Please continue to take your medication as prescribed.   Your prescription(s) have been submitted to your pharmacy or been printed and provided for you. Please take as directed and contact our office if you believe you are having problem(s) with the medication(s) or have any questions.  Follow up:  If your symptoms worsen or fail to improve, please contact our office for further instruction, or in case of emergency go directly to the emergency room at the closest medical facility.     Quadriceps Strain With Strain A strain is a tear in a muscle or the tendon that attaches the muscle to bone. A quadriceps strain is a tear in the muscles on the front of the thigh (quadriceps muscles) or their tendons. The quadriceps muscles are important for straightening the knee and bending the hip. The condition is characterized by pain, inflammation, and reduced function of these muscles. Strains are classified into three categories. Grade 1 strains cause pain, but the tendon is not lengthened. Grade 2 strains include a lengthened ligament due to the ligament being stretched or partially ruptured. With grade 2 strains there is still function, although the function may be diminished. Grade 3 strains are characterized by a complete tear of the tendon or muscle, and function is usually impaired.  SYMPTOMS   Pain, tenderness, inflammation, and/or bruising (contusion) over the quadriceps muscles  Pain that worsens with use of the quadriceps muscles.  Muscle spasm in the thigh.  Difficulty with common tasks that involve the quadriceps muscle, such as walking.  A crackling sound (crepitation) when the tendon is moved or  touched.  Loss of fullness of the muscle or bulging within the area of muscle with complete rupture. CAUSES  A strain occurs when a force is placed on the muscle or tendon that is greater than it can withstand. Common mechanisms of injury include:  Repetitive strenuous use of the quadriceps muscles. This may be due to an increase in the intensity, frequency, or duration of exercise.  Direct trauma to the quadriceps muscles or tendons. RISK INCREASES WITH:  Activities that involve forceful contractions of the quadriceps muscles (jumping or sprinting).  Contact sports (soccer or football).  Poor strength and flexibility.  Failure to warm-up properly before activity.  Previous injury to the thigh or knee. PREVENTION  Warm up and stretch properly before activity.  Allow for adequate recovery between workouts.  Maintain physical fitness:  Strength, flexibility, and endurance.  Cardiovascular fitness.  Wear properly fitted and padded protective equipment. PROGNOSIS  If treated properly, then quadriceps muscles strains are usually curable within 6 weeks.  RELATED COMPLICATIONS   Prolonged healing time, if improperly treated or re-injured.  Recurrent symptoms that result in a chronic problem.  Recurrence of symptoms if activity is resumed too soon. TREATMENT  Treatment initially involves the use of ice and medication to help reduce pain and inflammation. The use of strengthening and stretching exercises may help reduce pain with activity. These exercises may be performed at home or with referral to a therapist. Crutches may be recommended to allow the muscle to rest until walking can be completed without limping. Surgery is rarely necessary for this injury, but may be considered if  the injury involves a grade 3 strain, or if symptoms persist for greater than 3 months despite non-surgical (conservative) treatment.  MEDICATION  If pain medication is necessary, then nonsteroidal  anti-inflammatory medications, such as aspirin and ibuprofen, or other minor pain relievers, such as acetaminophen, are often recommended.  Do not take pain medication for 7 days before surgery.  Prescription pain relievers may be given if deemed necessary by your caregiver. Use only as directed and only as much as you need.  Ointments applied to the skin may be helpful.  Corticosteroid injections may be given by your caregiver. These injections should be reserved for the most serious cases, because they may only be given a certain number of times. HEAT AND COLD  Cold treatment (icing) relieves pain and reduces inflammation. Cold treatment should be applied for 10 to 15 minutes every 2 to 3 hours for inflammation and pain and immediately after any activity that aggravates your symptoms. Use ice packs or massage the area with a piece of ice (ice massage).  Heat treatment may be used prior to performing the stretching and strengthening activities prescribed by your caregiver, physical therapist, or athletic trainer. Use a heat pack or soak the injury in warm water. SEEK MEDICAL CARE IF:  Treatment seems to offer no benefit, or the condition worsens.  Any medications produce adverse side effects. EXERCISES  RANGE OF MOTION (ROM) AND STRETCHING EXERCISES - Quadriceps Strain These exercises may help you when beginning to rehabilitate your injury. Your symptoms may resolve with or without further involvement from your physician, physical therapist or athletic trainer. While completing these exercises, remember:   Restoring tissue flexibility helps normal motion to return to the joints. This allows healthier, less painful movement and activity.  An effective stretch should be held for at least 30 seconds.  A stretch should never be painful. You should only feel a gentle lengthening or release in the stretched tissue. RANGE OF MOTION - Knee Flexion, Active  Lie on your back with both knees  straight. (If this causes back discomfort, bend your opposite knee, placing your foot flat on the floor.)  Slowly slide your heel back toward your buttocks until you feel a gentle stretch in the front of your knee or thigh.  Hold for __________ seconds. Slowly slide your heel back to the starting position. Repeat __________ times. Complete this exercise __________ times per day.  STRETCH - Quadriceps, Prone  Lie on your stomach on a firm surface, such as a bed or padded floor.  Bend your right / left knee and grasp your ankle. If you are unable to reach, your ankle or pant leg, use a belt around your foot to lengthen your reach.  Gently pull your heel toward your buttocks. Your knee should not slide out to the side. You should feel a stretch in the front of your thigh and/or knee.  Hold this position for __________ seconds. Repeat __________ times. Complete this stretch __________ times per day.  STRETCHING - Hip Flexors, Lunge  Half kneel with your right / left knee on the floor and your opposite knee bent and directly over your ankle.  Keep good posture with your head over your shoulders. Tighten your buttocks to point your tailbone downward; this will prevent your back from arching too much.  You should feel a gentle stretch in the front of your thigh and/or hip. If you do not feel any resistance, slightly slide your opposite foot forward and then slowly lunge forward so  your knee once again lines up over your ankle. Be sure your tailbone remains pointed downward.  Hold this stretch for __________ seconds. Repeat __________ times. Complete this stretch __________ times per day. STRENGTHENING EXERCISES - Quadriceps Strain These exercises may help you when beginning to rehabilitate your injury. They may resolve your symptoms with or without further involvement from your physician, physical therapist or athletic trainer. While completing these exercises, remember:   Muscles can gain both  the endurance and the strength needed for everyday activities through controlled exercises.  Complete these exercises as instructed by your physician, physical therapist or athletic trainer. Progress the resistance and repetitions only as guided. STRENGTH - Quadriceps, Isometrics  Lie on your back with your right / left leg extended and your opposite knee bent.  Gradually tense the muscles in the front of your right / left thigh. You should see either your knee cap slide up toward your hip or increased dimpling just above the knee. This motion will push the back of the knee down toward the floor/mat/bed on which you are lying.  Hold the muscle as tight as you can without increasing your pain for __________ seconds.  Relax the muscles slowly and completely in between each repetition. Repeat __________ times. Complete this exercise __________ times per day.  STRENGTH - Quadriceps, Short Arcs   Lie on your back. Place a __________ inch towel roll under your knee so that the knee slightly bends.  Raise only your lower leg by tightening the muscles in the front of your thigh. Do not allow your thigh to rise.  Hold this position for __________ seconds. Repeat __________ times. Complete this exercise __________ times per day.  OPTIONAL ANKLE WEIGHTS: Begin with ____________________, but DO NOT exceed ____________________. Increase in1 lb/0.5 kg increments. STRENGTH - Quadriceps, Straight Leg Raises  Quality counts! Watch for signs that the quadriceps muscle is working to insure you are strengthening the correct muscles and not "cheating" by substituting with healthier muscles.  Lay on your back with your right / left leg extended and your opposite knee bent.  Tense the muscles in the front of your right / left thigh. You should see either your knee cap slide up or increased dimpling just above the knee. Your thigh may even quiver.  Tighten these muscles even more and raise your leg 4 to 6  inches off the floor. Hold for __________ seconds.  Keeping these muscles tense, lower your leg.  Relax the muscles slowly and completely in between each repetition. Repeat __________ times. Complete this exercise __________ times per day.  STRENGTH - Quadriceps, Wall Slides  Follow guidelines for form closely. Increased knee pain often results from poorly placed feet or knees.  Lean against a smooth wall or door and walk your feet out 18-24 inches. Place your feet hip-width apart.  Slowly slide down the wall or door until your knees bend __________ degrees.* Keep your knees over your heels, not your toes, and in line with your hips, not falling to either side.  Hold for __________ seconds. Stand up to rest for __________ seconds in between each repetition. Repeat __________ times. Complete this exercise __________ times per day. * Your physician, physical therapist or athletic trainer will alter this angle based on your symptoms and progress. STRENGTH - Quadriceps, Step-Ups   Use a thick book, step or step stool that is __________ inches tall.  Holding a wall or counter for balance only, not support.  Slowly step-up with your right /  left foot, keeping your knee in line with your hip and foot. Do not allow your knee to bend so far that you cannot see your toes.  Slowly unlock your knee and lower yourself to the starting position. Your muscles, not gravity, should lower you. Repeat __________ times. Complete this exercise __________ times per day.   This information is not intended to replace advice given to you by your health care provider. Make sure you discuss any questions you have with your health care provider.   Document Released: 11/30/2005 Document Revised: 04/16/2015 Document Reviewed: 03/14/2009 Elsevier Interactive Patient Education [employer].

## 2016-11-13 ENCOUNTER — Emergency Department (HOSPITAL_COMMUNITY): Payer: Medicare Other

## 2016-11-13 ENCOUNTER — Emergency Department (HOSPITAL_COMMUNITY)
Admission: EM | Admit: 2016-11-13 | Discharge: 2016-11-13 | Disposition: A | Discharge status: Home / Self Care | Payer: Medicare Other | Attending: Emergency Medicine | Admitting: Emergency Medicine

## 2016-11-13 ENCOUNTER — Encounter (HOSPITAL_COMMUNITY): Payer: Self-pay

## 2016-11-13 DIAGNOSIS — Z87891 Personal history of nicotine dependence: Secondary | ICD-10-CM | POA: Diagnosis not present

## 2016-11-13 DIAGNOSIS — F0281 Dementia in other diseases classified elsewhere with behavioral disturbance: Secondary | ICD-10-CM | POA: Diagnosis not present

## 2016-11-13 DIAGNOSIS — R410 Disorientation, unspecified: Secondary | ICD-10-CM | POA: Diagnosis not present

## 2016-11-13 DIAGNOSIS — I10 Essential (primary) hypertension: Secondary | ICD-10-CM | POA: Diagnosis not present

## 2016-11-13 DIAGNOSIS — R402411 Glasgow coma scale score 13-15, in the field [EMT or ambulance]: Secondary | ICD-10-CM | POA: Diagnosis not present

## 2016-11-13 DIAGNOSIS — R404 Transient alteration of awareness: Secondary | ICD-10-CM | POA: Diagnosis not present

## 2016-11-13 DIAGNOSIS — G309 Alzheimer's disease, unspecified: Secondary | ICD-10-CM | POA: Insufficient documentation

## 2016-11-13 DIAGNOSIS — R4189 Other symptoms and signs involving cognitive functions and awareness: Secondary | ICD-10-CM

## 2016-11-13 DIAGNOSIS — R4182 Altered mental status, unspecified: Secondary | ICD-10-CM | POA: Diagnosis present

## 2016-11-13 LAB — URINE MICROSCOPIC-ADD ON

## 2016-11-13 LAB — URINALYSIS, ROUTINE W REFLEX MICROSCOPIC
BILIRUBIN URINE: NEGATIVE
GLUCOSE, UA: NEGATIVE mg/dL
Ketones, ur: NEGATIVE mg/dL
Leukocytes, UA: NEGATIVE
Nitrite: NEGATIVE
PROTEIN: 30 mg/dL — AB
Specific Gravity, Urine: 1.025 (ref 1.005–1.030)
pH: 6.5 (ref 5.0–8.0)

## 2016-11-13 LAB — COMPREHENSIVE METABOLIC PANEL
ALBUMIN: 4.6 g/dL (ref 3.5–5.0)
ALT: 13 U/L — AB (ref 17–63)
AST: 18 U/L (ref 15–41)
Alkaline Phosphatase: 51 U/L (ref 38–126)
Anion gap: 7 (ref 5–15)
BUN: 18 mg/dL (ref 6–20)
CHLORIDE: 107 mmol/L (ref 101–111)
CO2: 27 mmol/L (ref 22–32)
CREATININE: 0.9 mg/dL (ref 0.61–1.24)
Calcium: 9.5 mg/dL (ref 8.9–10.3)
GFR calc Af Amer: >60 mL/min (ref >60)
GFR calc non Af Amer: >60 mL/min (ref >60)
GLUCOSE: 79 mg/dL (ref 65–99)
POTASSIUM: 4.3 mmol/L (ref 3.5–5.1)
Sodium: 141 mmol/L (ref 135–145)
Total Bilirubin: 0.6 mg/dL (ref 0.3–1.2)
Total Protein: 7.9 g/dL (ref 6.5–8.1)

## 2016-11-13 LAB — CBC WITH DIFFERENTIAL/PLATELET
Basophils Absolute: 0 10*3/uL (ref 0.0–0.1)
Basophils Relative: 0 %
EOS PCT: 1 %
Eosinophils Absolute: 0.1 10*3/uL (ref 0.0–0.7)
HCT: 40.2 % (ref 39.0–52.0)
Hemoglobin: 12.8 g/dL — ABNORMAL LOW (ref 13.0–17.0)
LYMPHS ABS: 2.3 10*3/uL (ref 0.7–4.0)
LYMPHS PCT: 30 %
MCH: 28.8 pg (ref 26.0–34.0)
MCHC: 31.8 g/dL (ref 30.0–36.0)
MCV: 90.3 fL (ref 78.0–100.0)
MONO ABS: 0.7 10*3/uL (ref 0.1–1.0)
MONOS PCT: 9 %
Neutro Abs: 4.6 10*3/uL (ref 1.7–7.7)
Neutrophils Relative %: 60 %
PLATELETS: 231 10*3/uL (ref 150–400)
RBC: 4.45 MIL/uL (ref 4.22–5.81)
RDW: 13.8 % (ref 11.5–15.5)
WBC: 7.7 10*3/uL (ref 4.0–10.5)

## 2016-11-13 LAB — I-STAT TROPONIN, ED: Troponin i, poc: 0 ng/mL (ref 0.00–0.08)

## 2016-11-13 LAB — CBG MONITORING, ED: Glucose-Capillary: 84 mg/dL (ref 65–99)

## 2016-11-13 MED ORDER — OLANZAPINE 5 MG PO TBDP
5.0000 mg | ORAL_TABLET | Freq: Once | ORAL | Status: AC
Start: 2016-11-13 — End: 2016-11-13
  Administered 2016-11-13: 5 mg via ORAL
  Filled 2016-11-13: qty 1

## 2016-11-13 NOTE — Discharge Instructions (Signed)
Please return immediately if Christopher Summers has another episode of unresponsiveness or a major change from his baseline behavior.

## 2016-11-13 NOTE — ED Notes (Signed)
Bed: WA07 Expected date:  Expected time:  Means of arrival:  Comments: [facility]/lethargy

## 2016-11-13 NOTE — ED Notes (Signed)
ED Provider at bedside. 

## 2016-11-13 NOTE — ED Notes (Signed)
Patient transported to CT 

## 2016-11-13 NOTE — ED Notes (Signed)
Hold lab draw per MD @ this time.

## 2016-11-13 NOTE — ED Triage Notes (Signed)
Per [facility]- Witnessed- Pt at adult care center. It was reported pt finished apple sauce and laid head down on table and went to sleep. Pt was unable to be awakened by staff. Attempts with voice commands, sternum stimulation gave no response. EMS performed painful stimulation and received spontaneous withdraws without eyes opening. No verbal output. No route in change. No incontinent, no emesis, No seizure noted. Upon arrival to ED. Spontaneous recovery. Pt alert and oriented per his baseline. Pt unable to recall event. However pt has short term memory deficit issues as baseline. Family in route.

## 2016-11-13 NOTE — ED Provider Notes (Signed)
[facility] DEPT Provider Note   CSN: [id number] Arrival date & time: 11/13/16  1232     History   Chief Complaint Chief Complaint  Patient presents with  . Altered Mental Status    HPI Christopher Summers is a 75 y.o. male.  75yo M w/ PMH including dementia, BPH, HTN who p/w unresponsiveness. History obtained from EMS, who report that the patient was at his adult care center today. After finishing lunch he laid his head down on the table and went to sleep. When staff went to awake him they were unable to wake him up and both verbal commands and sternal rub did not yield any response. EMS noted that painful stimulation caused patient to withdrawal with no eye opening. He did not have any incontinence or vomiting, no seizure activity. By the time he arrived to ED, his symptoms had spontaneously resolved. Wife, who just arrived a few minutes ago, states that he is at his baseline. She reports that he has not been sick recently including no fevers, vomiting, or cough/cold symptoms. No history of seizure activity or heart problems.  LEVEL 5 CAVEAT DUE TO DEMENTIA   The history is provided by the EMS personnel and the spouse.    Past Medical History:  Diagnosis Date  . Alzheimer's disease   . BPH (benign prostatic hypertrophy)   . Dementia   . Depression   . Essential hypertension, benign   . Reflux     Patient Active Problem List   Diagnosis Date Noted  . Right leg pain 10/19/2016  . Left leg pain 06/30/2016  . Abscess of axilla, left 06/15/2016  . Anxiety state 10/26/2014  . 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 Acute blood loss anemia 10/15/2014  . Hip fracture, right ([place]) 10/08/2014  . Alzheimer's dementia with behavioral disturbance 10/08/2014  . Preoperative evaluation 10/08/2014  . Major depressive disorder, recurrent episode ([place]) 10/05/2007  . HYPERTENSION, BENIGN 10/05/2007  . CYSTITIS, ACUTE 10/05/2007  . BPH (benign prostatic hyperplasia) 10/05/2007     Past Surgical History:  Procedure Laterality Date  . HIP ARTHROPLASTY Right 10/09/2014   Procedure: Right Hemiarthroplasty;  Surgeon: Rozanna Box, MD;  Location: [place];  Service: Orthopedics;  Laterality: Right;  . TRANSURETHRAL RESECTION OF PROSTATE         Home Medications    Prior to Admission medications   Medication Sig Start Date End Date Taking? Authorizing Provider  acetaminophen (TYLENOL) 500 MG tablet Take 500 mg by mouth every 6 (six) hours as needed (pain).   Yes Historical Provider, MD  divalproex (DEPAKOTE ER) 250 MG 24 hr tablet Take 250 mg by mouth daily.    Yes Historical Provider, MD  donepezil (ARICEPT) 10 MG tablet Take 10 mg by mouth at bedtime.    Yes Historical Provider, MD  finasteride (PROSCAR) 5 MG tablet TAKE 1 TABLET BY MOUTH DAILY 05/03/15  Yes Golden Circle, FNP  hydrOXYzine (ATARAX/VISTARIL) 10 MG tablet Take 10 mg by mouth 2 (two) times daily.    Yes Historical Provider, MD  memantine (NAMENDA) 10 MG tablet Take 10 mg by mouth daily.   Yes Historical Provider, MD  QUEtiapine Fumarate (SEROQUEL PO) Take 25 mg by mouth at bedtime.    Yes Historical Provider, MD  sertraline (ZOLOFT) 50 MG tablet Take 50 mg by mouth daily.   Yes Historical Provider, MD  traZODone (DESYREL) 50 MG tablet Take 50 mg by mouth at bedtime.   Yes Historical Provider, MD  diclofenac  sodium (VOLTAREN) 1 % GEL Apply 2 g topically 4 (four) times daily. Patient not taking: Reported on 11/13/2016 06/30/16   Hoyt Koch, MD  loratadine (CLARITIN) 10 MG tablet Take 1 tablet (10 mg total) by mouth daily. Patient not taking: Reported on 11/13/2016 06/15/16   Golden Circle, FNP  LORazepam (ATIVAN) 0.5 MG tablet Take 1 mg by mouth every 8 (eight) hours.     Historical Provider, MD  polyethylene glycol (MIRALAX / GLYCOLAX) packet Take 17 g by mouth daily. Patient not taking: Reported on 11/13/2016 07/07/16   Sherlene Shams, MD    Family History Family History  Problem  Relation Age of Onset  . Heart disease Brother   . Heart attack Father     Social History Social History  Substance Use Topics  . Smoking status: Former Smoker    Packs/day: 0.50    Types: Cigarettes    Quit date: 10/09/2014  . Smokeless tobacco: Never Used  . Alcohol use No     Allergies   Niacin and related and Terazosin   Review of Systems Review of Systems  Unable to perform ROS: Dementia     Physical Exam Updated Vital Signs BP 126/77   Pulse (!) 58   Temp 97.5 F (36.4 C) (Axillary)   Resp 16   SpO2 99%   Physical Exam  Constitutional: He appears well-developed and well-nourished. No distress.  HENT:  Head: Normocephalic and atraumatic.  Moist mucous membranes  Eyes: Conjunctivae are normal. Pupils are equal, round, and reactive to light.  Neck: Neck supple.  Cardiovascular: Normal rate, regular rhythm and normal heart sounds.   No murmur heard. Pulmonary/Chest: Effort normal and breath sounds normal.  Abdominal: Soft. Bowel sounds are normal. He exhibits no distension. There is no tenderness.  Musculoskeletal: He exhibits no edema.  Neurological: He is alert.  Oriented to person w/ some confusion, able to follow basic commands, moving all 4 extremities equally  Skin: Skin is warm and dry.  Nursing note and vitals reviewed.    ED Treatments / Results  Labs (all labs ordered are listed, but only abnormal results are displayed) Labs Reviewed  COMPREHENSIVE METABOLIC PANEL - Abnormal; Notable for the following:       Result Value   ALT 13 (*)    All other components within normal limits  CBC WITH DIFFERENTIAL/PLATELET - Abnormal; Notable for the following:    Hemoglobin 12.8 (*)    All other components within normal limits  URINALYSIS, ROUTINE W REFLEX MICROSCOPIC (NOT AT [facility])  CBG MONITORING, ED  I-STAT TROPOININ, ED    EKG  EKG Interpretation  Date/Time:  Friday November 13 2016 13:18:14 EST Ventricular Rate:  58 PR Interval:    QRS  Duration: 110 QT Interval:  440 QTC Calculation: 433 R Axis:   13 Text Interpretation:  Sinus rhythm Abnormal R-wave progression, early transition No significant change since last tracing Confirmed by LITTLE MD, RACHEL [id number]) on 11/13/2016 2:01:48 PM       Radiology Ct Head Wo Contrast  Result Date: 11/13/2016 CLINICAL DATA:  Altered mental status with transient loss of consciousness. Underlying short-term memory deficits EXAM: CT HEAD WITHOUT CONTRAST TECHNIQUE: Contiguous axial images were obtained from the base of the skull through the vertex without intravenous contrast. COMPARISON:  October 08, 2014 FINDINGS: Brain: There is moderate diffuse atrophy. There is no intracranial mass, hemorrhage, extra-axial fluid collection, or midline shift. There is small vessel disease throughout the centra semiovale bilaterally. 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 No acute infarct is demonstrable on this study. Vascular: There is no hyperdense vessel. There is calcification in each carotid siphon region. There is also calcification in the distal left vertebral artery. Skull: The bony calvarium appears intact. Sinuses/Orbits: There is mucosal thickening in several ethmoid air cells bilaterally. There is opacification throughout much of the right sphenoid sinus. Other visualized paranasal sinuses are clear. Orbits appear symmetric bilaterally. Other: Mastoid air cells are clear. IMPRESSION: Stable atrophy with periventricular small vessel disease. 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 No evidence of intracranial mass, hemorrhage, extra-axial fluid collection, or acute appearing infarct. There are foci of arterial vascular calcification. There is paranasal sinus disease, most notably in the right sphenoid sinus. Electronically Signed   By: Lowella Grip III M.D.   On: 11/13/2016 13:51    Procedures Procedures (including  critical care time)  Medications Ordered in ED Medications  OLANZapine zydis (ZYPREXA) disintegrating tablet 5 mg (5 mg Oral Given 11/13/16 1335)     Initial Impression / Assessment and Plan / ED Course  I have reviewed the triage vital signs and the nursing notes.  Pertinent labs & imaging results that were available during my care of the patient were reviewed by me and considered in my medical decision making (see chart for details).  Clinical Course    Pt w/ dementia p/w unresponsiveness episode at nursing facility today witnessed by staff and then by EMS, spontaneous resolution on arrival to ED. He was awake and alert, oriented to person on my exam. No focal weakness. He became very agitated w/ attempt at IV. Gave zyprexa due to agitation.  Head CT negative for acute process. EKG unchanged from previous. All labs including CBC, CMP, troponin were unremarkable. On reexamination, the patient continues to be at his mental status baseline according to family. Family member states that he does sometimes "play games" pretending that he is asleep when he is actually awake. Family member suspects that this is possibly happens. No concerning features of story to suggest seizure activity and no cardiac abnormalities on workup. I discussed options with family including overnight observation versus discharge home, reviewing risks and benefits of each. Because he has been at baseline during ED stay, Family felt comfortable with him going home and voiced understanding of return precautions. Patient discharged in satisfactory condition.  Final Clinical Impressions(s) / ED Diagnoses   Final diagnoses:  Episode of unresponsiveness    New Prescriptions New Prescriptions   No medications on file     Sharlett Iles, MD 11/13/16 1534

## 2016-11-13 NOTE — ED Notes (Signed)
Pt unable to use urinal. Multiple attempts made with family to assist. Condom cath placed

## 2016-11-16 DIAGNOSIS — L409 Psoriasis, unspecified: Secondary | ICD-10-CM | POA: Diagnosis not present

## 2016-11-16 DIAGNOSIS — L819 Disorder of pigmentation, unspecified: Secondary | ICD-10-CM | POA: Diagnosis not present

## 2017-03-05 ENCOUNTER — Ambulatory Visit (INDEPENDENT_AMBULATORY_CARE_PROVIDER_SITE_OTHER): Payer: Medicare Other | Admitting: Adult Health

## 2017-03-05 VITALS — BP 176/84

## 2017-03-05 DIAGNOSIS — L0291 Cutaneous abscess, unspecified: Secondary | ICD-10-CM | POA: Diagnosis not present

## 2017-03-05 MED ORDER — CEPHALEXIN 500 MG PO CAPS: 500.0000 mg | ORAL_CAPSULE | Freq: Three times a day (TID) | ORAL | 0 refills | Status: DC

## 2017-03-05 NOTE — Progress Notes (Signed)
Subjective:    Patient ID: Christopher Summers, male    DOB: [birth year], 76 y.o.   MRN: [id number]  HPI  76 year old male who  has a past medical history of Alzheimer's disease; BPH (benign prostatic hypertrophy); Dementia; Depression; Essential hypertension, benign; and Reflux. He presents with caregiver for concern of abscess on left elbow. Caregiver reports that for the last week the patient has had an abscess on his left elbow that has been growing in size. She endorses redness and warmth. No discharge   His mental status has not changed past baseline    Review of Systems Negative unless mentioned above   Past Medical History:  Diagnosis Date  . Alzheimer's disease   . BPH (benign prostatic hypertrophy)   . Dementia   . Depression   . Essential hypertension, benign   . Reflux     Social History   Social History  . Marital status: Married    Spouse name: N/A  . Number of children: 2  . Years of education: 11   Occupational History  . Retired    Social History Main Topics  . Smoking status: Former Smoker    Packs/day: 0.50    Types: Cigarettes    Quit date: 10/09/2014  . Smokeless tobacco: Never Used  . Alcohol use No  . Drug use: No  . Sexual activity: Not on file   Other Topics Concern  . Not on file   Social History Narrative   Fun: Likes to stay at home.   Denies any religious beliefs effecting health care.     Past Surgical History:  Procedure Laterality Date  . HIP ARTHROPLASTY Right 10/09/2014   Procedure: Right Hemiarthroplasty;  Surgeon: Rozanna Box, MD;  Location: [place];  Service: Orthopedics;  Laterality: Right;  . TRANSURETHRAL RESECTION OF PROSTATE      Family History  Problem Relation Age of Onset  . Heart disease Brother   . Heart attack Father     Allergies  Allergen Reactions  . Niacin And Related Other (See Comments)    Turned red all over  . Terazosin     From [facility]    Current Outpatient Prescriptions on File Prior to  Visit  Medication Sig Dispense Refill  . acetaminophen (TYLENOL) 500 MG tablet Take 500 mg by mouth every 6 (six) hours as needed (pain).    Marland Kitchen diclofenac sodium (VOLTAREN) 1 % GEL Apply 2 g topically 4 (four) times daily. (Patient not taking: Reported on 11/13/2016) 100 g 6  . divalproex (DEPAKOTE ER) 250 MG 24 hr tablet Take 250 mg by mouth daily.     Marland Kitchen donepezil (ARICEPT) 10 MG tablet Take 10 mg by mouth at bedtime.     . finasteride (PROSCAR) 5 MG tablet TAKE 1 TABLET BY MOUTH DAILY 30 tablet 5  . hydrOXYzine (ATARAX/VISTARIL) 10 MG tablet Take 10 mg by mouth 2 (two) times daily.     Marland Kitchen loratadine (CLARITIN) 10 MG tablet Take 1 tablet (10 mg total) by mouth daily. (Patient not taking: Reported on 11/13/2016) 30 tablet 0  . LORazepam (ATIVAN) 0.5 MG tablet Take 1 mg by mouth every 8 (eight) hours.     . memantine (NAMENDA) 10 MG tablet Take 10 mg by mouth daily.    . polyethylene glycol (MIRALAX / GLYCOLAX) packet Take 17 g by mouth daily. (Patient not taking: Reported on 11/13/2016) 30 each 0  . QUEtiapine Fumarate (SEROQUEL PO) Take 25 mg by mouth at  bedtime.     . sertraline (ZOLOFT) 50 MG tablet Take 50 mg by mouth daily.    . traZODone (DESYREL) 50 MG tablet Take 50 mg by mouth at bedtime.     No current facility-administered medications on file prior to visit.     There were no vitals taken for this visit.      Objective:   Physical Exam  Constitutional: He appears well-developed and well-nourished. No distress.  Neurological: He is alert.  Skin: Skin is warm and dry. No rash noted. He is not diaphoretic. There is erythema. No pallor.  Quarter sized abscess noted on left elbow. Redness and warmth noted. Painful with palpation. Non fluctuant, no drainage noted.    Psychiatric: He has a normal mood and affect. His behavior is normal.  Nursing note and vitals reviewed.     Assessment & Plan:  1. Abscess - Will start on antibiotics. Advised to follow up with PCP on Monday if no  improvement  - cephALEXin (KEFLEX) 500 MG capsule; Take 1 capsule (500 mg total) by mouth 3 (three) times daily.  Dispense: 30 capsule; Refill: 0 - Can take tylenol for pain  - Use a heating pad   Dorothyann Peng, NP

## 2017-04-30 ENCOUNTER — Encounter: Payer: Self-pay | Admitting: Family

## 2017-04-30 ENCOUNTER — Ambulatory Visit (INDEPENDENT_AMBULATORY_CARE_PROVIDER_SITE_OTHER): Payer: Medicare Other | Admitting: Family

## 2017-04-30 DIAGNOSIS — L02419 Cutaneous abscess of limb, unspecified: Secondary | ICD-10-CM | POA: Diagnosis not present

## 2017-04-30 MED ORDER — DOXYCYCLINE HYCLATE 100 MG PO TABS: 100.0000 mg | ORAL_TABLET | Freq: Two times a day (BID) | ORAL | 0 refills | Status: DC

## 2017-04-30 NOTE — Patient Instructions (Signed)
Thank you for choosing [facility].  SUMMARY AND INSTRUCTIONS:  Start taking the doxycycline.  Keep clean with soap and water.   Medication:  Your prescription(s) have been submitted to your pharmacy or been printed and provided for you. Please take as directed and contact our office if you believe you are having problem(s) with the medication(s) or have any questions.  Follow up:  If your symptoms worsen or fail to improve, please contact our office for further instruction, or in case of emergency go directly to the emergency room at the closest medical facility.     Skin Abscess A skin abscess is an infected area on or under your skin that contains pus and other material. An abscess can happen almost anywhere on your body. Some abscesses break open (rupture) on their own. Most continue to get worse unless they are treated. The infection can spread deeper into the body and into your blood, which can make you feel sick. Treatment usually involves draining the abscess. Follow these instructions at home: Abscess Care   If you have an abscess that has not drained, place a warm, clean, wet washcloth over the abscess several times a day. Do this as told by your doctor.  Follow instructions from your doctor about how to take care of your abscess. Make sure you:  Cover the abscess with a bandage (dressing).  Change your bandage or gauze as told by your doctor.  Wash your hands with soap and water before you change the bandage or gauze. If you cannot use soap and water, use hand sanitizer.  Check your abscess every day for signs that the infection is getting worse. Check for:  More redness, swelling, or pain.  More fluid or blood.  Warmth.  More pus or a bad smell. Medicines    Take over-the-counter and prescription medicines only as told by your doctor.  If you were prescribed an antibiotic medicine, take it as told by your doctor. Do not stop taking the antibiotic even  if you start to feel better. General instructions   To avoid spreading the infection:  Do not share personal care items, towels, or hot tubs with others.  Avoid making skin-to-skin contact with other people.  Keep all follow-up visits as told by your doctor. This is important. Contact a doctor if:  You have more redness, swelling, or pain around your abscess.  You have more fluid or blood coming from your abscess.  Your abscess feels warm when you touch it.  You have more pus or a bad smell coming from your abscess.  You have a fever.  Your muscles ache.  You have chills.  You feel sick. Get help right away if:  You have very bad (severe) pain.  You see red streaks on your skin spreading away from the abscess. This information is not intended to replace advice given to you by your health care provider. Make sure you discuss any questions you have with your health care provider. Document Released: 05/18/2008 Document Revised: 07/26/2016 Document Reviewed: 10/09/2015 Elsevier Interactive Patient Education  2017 [employer].

## 2017-04-30 NOTE — Progress Notes (Signed)
Subjective:    Patient ID: Christopher Summers, male    DOB: 25-Feb-1941, 76 y.o.   MRN: [id number]  Chief Complaint  Patient presents with  . Leg Pain    left leg has a place on the lower part of the leg that is swollen and red, there is some soreness    HPI:  Christopher Summers is a 76 y.o. male who  has a past medical history of Alzheimer's disease; BPH (benign prostatic hypertrophy); Dementia; Depression; Essential hypertension, benign; and Reflux. and presents today for an acute office visit. Wife is present for today's office visit and provides history as patient has severe dementia.   This is a new problem. Associated symptom of a lump located on his lower left leg has been going on for about 1 week. Described as red and sore. No fevers. Symptoms have worsened since initial onset. No modifying factors or attempted treatments.   Allergies  Allergen Reactions  . Niacin And Related Other (See Comments)    Turned red all over  . Terazosin     From [facility]      Outpatient Medications Prior to Visit  Medication Sig Dispense Refill  . acetaminophen (TYLENOL) 500 MG tablet Take 500 mg by mouth every 6 (six) hours as needed (pain).    Marland Kitchen diclofenac sodium (VOLTAREN) 1 % GEL Apply 2 g topically 4 (four) times daily. 100 g 6  . divalproex (DEPAKOTE ER) 250 MG 24 hr tablet Take 250 mg by mouth daily.     Marland Kitchen donepezil (ARICEPT) 10 MG tablet Take 10 mg by mouth at bedtime.     . finasteride (PROSCAR) 5 MG tablet TAKE 1 TABLET BY MOUTH DAILY 30 tablet 5  . hydrOXYzine (ATARAX/VISTARIL) 10 MG tablet Take 10 mg by mouth 2 (two) times daily.     . memantine (NAMENDA) 10 MG tablet Take 10 mg by mouth daily.    . polyethylene glycol (MIRALAX / GLYCOLAX) packet Take 17 g by mouth daily. 30 each 0  . QUEtiapine Fumarate (SEROQUEL PO) Take 25 mg by mouth at bedtime.     . sertraline (ZOLOFT) 50 MG tablet Take 50 mg by mouth daily.    . traZODone (DESYREL) 50 MG tablet Take 50 mg by mouth at bedtime.     . cephALEXin (KEFLEX) 500 MG capsule Take 1 capsule (500 mg total) by mouth 3 (three) times daily. 30 capsule 0  . loratadine (CLARITIN) 10 MG tablet Take 1 tablet (10 mg total) by mouth daily. 30 tablet 0  . LORazepam (ATIVAN) 0.5 MG tablet Take 1 mg by mouth every 8 (eight) hours.      No facility-administered medications prior to visit.       Past Surgical History:  Procedure Laterality Date  . HIP ARTHROPLASTY Right 10/09/2014   Procedure: Right Hemiarthroplasty;  Surgeon: Rozanna Box, MD;  Location: [place];  Service: Orthopedics;  Laterality: Right;  . TRANSURETHRAL RESECTION OF PROSTATE        Past Medical History:  Diagnosis Date  . Alzheimer's disease   . BPH (benign prostatic hypertrophy)   . Dementia   . Depression   . Essential hypertension, benign   . Reflux       Review of Systems  Unable to perform ROS: Dementia      Objective:    BP (!) 154/82 (BP Location: Left Arm, Patient Position: Sitting, Cuff Size: Normal)   Pulse 61   Resp (!) 96   Ht  5\' 11"  (1.803 m)   Wt 161 lb (73 kg)   BMI 22.45 kg/m  Nursing note and vital signs reviewed.  Physical Exam  Constitutional: He is oriented to person, place, and time. He appears well-developed and well-nourished. No distress.  Cardiovascular: Normal rate, regular rhythm, normal heart sounds and intact distal pulses.   Pulmonary/Chest: Effort normal and breath sounds normal.  Neurological: He is alert and oriented to person, place, and time.  Skin: Skin is warm and dry.  Red/swollen lesion located on there anterior aspect of the left ankle that is tender to the touch. No drainage.  Psychiatric: He has a normal mood and affect. His behavior is normal. Judgment and thought content normal.       Assessment & Plan:   Problem List Items Addressed This Visit      Other   Abscess of leg    New onset abscess of distal lower extremity with concern for boil/cyst. Does not appear to be cellulitis at this  time. Treat conservatively with basic wound care and start doxycycline. Given patient's mental status does not appear to be reasonable to incise and drain at this time as it is likely to increased risk for infection and complicate healing. Continue to monitor and follow-up if symptoms worsen or do not improve.          I have discontinued Mr. Ramthun LORazepam, loratadine, and cephALEXin. I am also having him start on doxycycline. Additionally, I am having him maintain his memantine, donepezil, sertraline, hydrOXYzine, traZODone, finasteride, QUEtiapine Fumarate (SEROQUEL PO), diclofenac sodium, polyethylene glycol, divalproex, and acetaminophen.   Meds ordered this encounter  Medications  . doxycycline (VIBRA-TABS) 100 MG tablet    Sig: Take 1 tablet (100 mg total) by mouth 2 (two) times daily.    Dispense:  20 tablet    Refill:  0    Order Specific Question:   Supervising Provider    Answer:   Pricilla Holm A [[id number]]     Follow-up: Return if symptoms worsen or fail to improve.  Mauricio Po, FNP

## 2017-05-01 ENCOUNTER — Encounter: Payer: Self-pay | Admitting: Family

## 2017-05-01 DIAGNOSIS — L02419 Cutaneous abscess of limb, unspecified: Secondary | ICD-10-CM | POA: Insufficient documentation

## 2017-05-01 NOTE — Assessment & Plan Note (Signed)
New onset abscess of distal lower extremity with concern for boil/cyst. Does not appear to be cellulitis at this time. Treat conservatively with basic wound care and start doxycycline. Given patient's mental status does not appear to be reasonable to incise and drain at this time as it is likely to increased risk for infection and complicate healing. Continue to monitor and follow-up if symptoms worsen or do not improve.

## 2017-06-24 ENCOUNTER — Ambulatory Visit (INDEPENDENT_AMBULATORY_CARE_PROVIDER_SITE_OTHER): Payer: Medicare Other | Admitting: Family

## 2017-06-24 ENCOUNTER — Encounter: Payer: Self-pay | Admitting: Family

## 2017-06-24 DIAGNOSIS — L0292 Furuncle, unspecified: Secondary | ICD-10-CM | POA: Diagnosis not present

## 2017-06-24 MED ORDER — DOXYCYCLINE HYCLATE 100 MG PO TABS: 100.0000 mg | ORAL_TABLET | Freq: Two times a day (BID) | ORAL | 0 refills | Status: DC

## 2017-06-24 NOTE — Progress Notes (Signed)
Subjective:    Patient ID: Christopher Summers, male    DOB: 01-15-1941, 76 y.o.   MRN: [id number]  Chief Complaint  Patient presents with  . Abscess    having issues with abscesses coming up all over body    HPI:  Christopher Summers is a 76 y.o. male who  has a past medical history of Alzheimer's disease; BPH (benign prostatic hypertrophy); Dementia; Depression; Essential hypertension, benign; and Reflux. and presents today for an office visit. His daughter is present for today's office visit and provides the history given patient dementia.   Associated symptom of several abscesses located on his stomach, axilla and back that have been going on for several days. History of previously treated abscess with with doxycycline that helps to clear his symptoms. No fevers. Abscess on the back has a small amount of discharge. No fevers. Daughter indicates patient does shower with some assistance, but cannot guarantee that he cleans well.   Allergies  Allergen Reactions  . Niacin And Related Other (See Comments)    Turned red all over  . Terazosin     From [facility]      Outpatient Medications Prior to Visit  Medication Sig Dispense Refill  . acetaminophen (TYLENOL) 500 MG tablet Take 500 mg by mouth every 6 (six) hours as needed (pain).    Marland Kitchen diclofenac sodium (VOLTAREN) 1 % GEL Apply 2 g topically 4 (four) times daily. 100 g 6  . divalproex (DEPAKOTE ER) 250 MG 24 hr tablet Take 250 mg by mouth daily.     Marland Kitchen donepezil (ARICEPT) 10 MG tablet Take 10 mg by mouth at bedtime.     . finasteride (PROSCAR) 5 MG tablet TAKE 1 TABLET BY MOUTH DAILY 30 tablet 5  . hydrOXYzine (ATARAX/VISTARIL) 10 MG tablet Take 10 mg by mouth 2 (two) times daily.     . memantine (NAMENDA) 10 MG tablet Take 10 mg by mouth daily.    . polyethylene glycol (MIRALAX / GLYCOLAX) packet Take 17 g by mouth daily. 30 each 0  . QUEtiapine Fumarate (SEROQUEL PO) Take 25 mg by mouth at bedtime.     . sertraline (ZOLOFT) 50 MG tablet  Take 50 mg by mouth daily.    . traZODone (DESYREL) 50 MG tablet Take 50 mg by mouth at bedtime.    Marland Kitchen doxycycline (VIBRA-TABS) 100 MG tablet Take 1 tablet (100 mg total) by mouth 2 (two) times daily. 20 tablet 0   No facility-administered medications prior to visit.       Past Surgical History:  Procedure Laterality Date  . HIP ARTHROPLASTY Right 10/09/2014   Procedure: Right Hemiarthroplasty;  Surgeon: Rozanna Box, MD;  Location: [place];  Service: Orthopedics;  Laterality: Right;  . TRANSURETHRAL RESECTION OF PROSTATE        Past Medical History:  Diagnosis Date  . Alzheimer's disease   . BPH (benign prostatic hypertrophy)   . Dementia   . Depression   . Essential hypertension, benign   . Reflux       Review of Systems  Unable to perform ROS: Dementia      Objective:    BP 134/80 (BP Location: Left Arm, Patient Position: Sitting, Cuff Size: Normal)   Pulse (!) 59   Temp (!) 97.4 F (36.3 C) (Oral)   Resp 16   Ht 5\' 11"  (1.803 m)   Wt 165 lb (74.8 kg)   SpO2 95%   BMI 23.01 kg/m  Nursing note and  vital signs reviewed.  Physical Exam  Constitutional: He appears well-developed and well-nourished. No distress.  Cardiovascular: Normal rate, regular rhythm, normal heart sounds and intact distal pulses.   Pulmonary/Chest: Effort normal and breath sounds normal.  Neurological: He is alert. He is disoriented.  Skin: Skin is warm and dry.  Several small lesions and abscess located on his lower abdomen, lower back, and right axilla that appear consistent with abscess.   Psychiatric: He has a normal mood and affect. His behavior is normal. Judgment and thought content normal.       Assessment & Plan:   Problem List Items Addressed This Visit      Musculoskeletal and Integument   Boil    Multiple small boils noted in the right axilla, low back, and lower abdomen. Start doxycycline. Recommend cleansing with soap and water daily. When healed start Hibiclens  weekly. Follow-up if symptoms worsen or do not improve.          I am having Mr. Ladnier maintain his memantine, donepezil, sertraline, hydrOXYzine, traZODone, finasteride, QUEtiapine Fumarate (SEROQUEL PO), diclofenac sodium, polyethylene glycol, divalproex, acetaminophen, and doxycycline.   Meds ordered this encounter  Medications  . doxycycline (VIBRA-TABS) 100 MG tablet    Sig: Take 1 tablet (100 mg total) by mouth 2 (two) times daily.    Dispense:  40 tablet    Refill:  0    Order Specific Question:   Supervising Provider    Answer:   Pricilla Holm A [[id number]]     Follow-up: Return if symptoms worsen or fail to improve.  Mauricio Po, FNP

## 2017-06-24 NOTE — Assessment & Plan Note (Signed)
Multiple small boils noted in the right axilla, low back, and lower abdomen. Start doxycycline. Recommend cleansing with soap and water daily. When healed start Hibiclens weekly. Follow-up if symptoms worsen or do not improve.

## 2017-06-24 NOTE — Patient Instructions (Signed)
Thank you for choosing [facility].  SUMMARY AND INSTRUCTIONS:   Start the doxycycline.   Keep sites clean with soap and water until healed.   Start Hibiclens once weekly to cleanse skin.   Follow up if symptoms worsen or do not improve.   Medication:  Your prescription(s) have been submitted to your pharmacy or been printed and provided for you. Please take as directed and contact our office if you believe you are having problem(s) with the medication(s) or have any questions.  Follow up:  If your symptoms worsen or fail to improve, please contact our office for further instruction, or in case of emergency go directly to the emergency room at the closest medical facility.

## 2017-06-30 IMAGING — CT CT HEAD W/O CM
2 of 4 series · 14 of 47 positions shown, 17 images · non-contrast
Comparison: October 08, 2014

CLINICAL DATA: Altered mental status with transient loss of
consciousness. Underlying short-term memory deficits

EXAM:
CT HEAD WITHOUT CONTRAST
TECHNIQUE: Contiguous axial images were obtained from the base of the skull
through the vertex without intravenous contrast.

[Series 2: head w/o · axial · non-contrast · 0.45mm/px · z∈[-172,-52]mm · 11 of 30 slices shown, 14 images]
[im 3/30  brain]
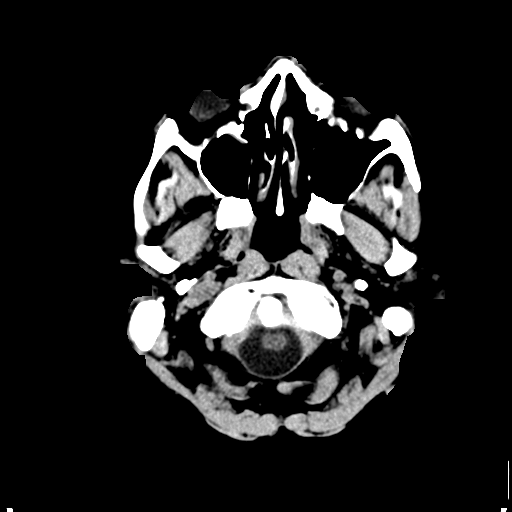
[im 3/30  bone]
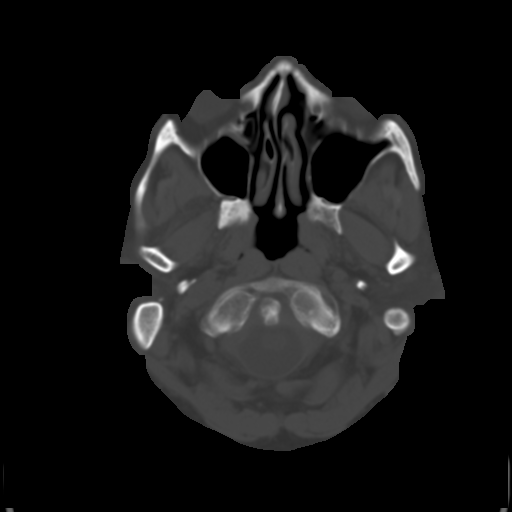
[im 5/30  brain]
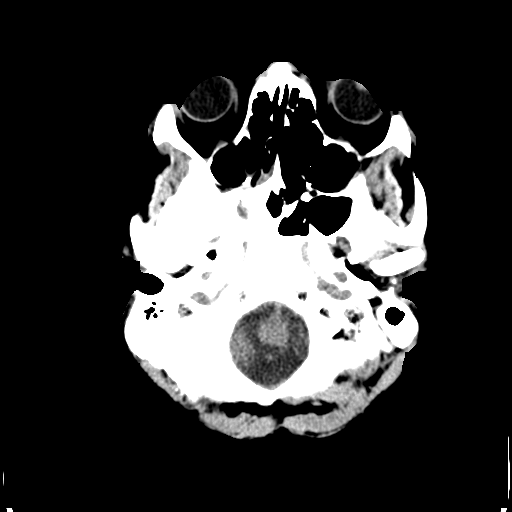
[im 7/30  brain]
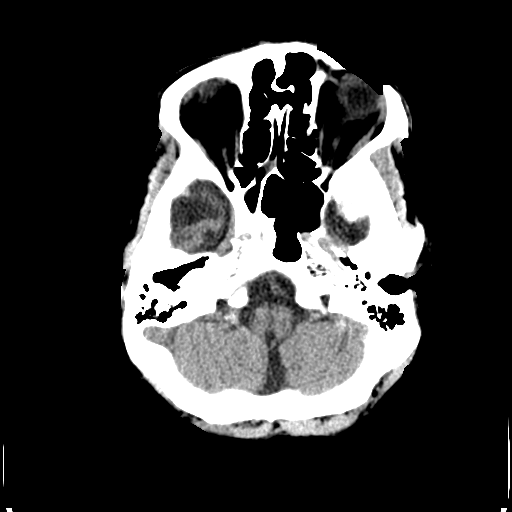
[im 11/30  brain]
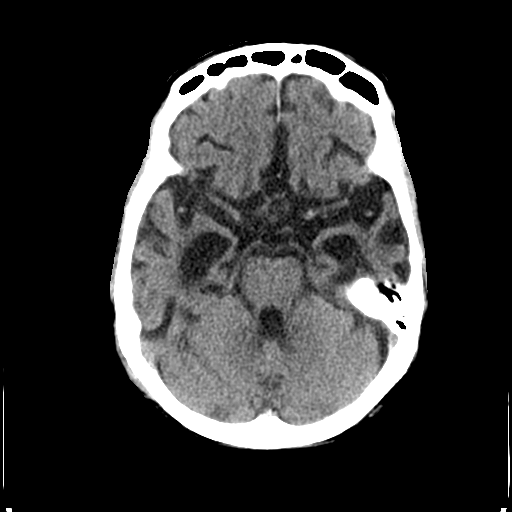
[im 13/30  brain]
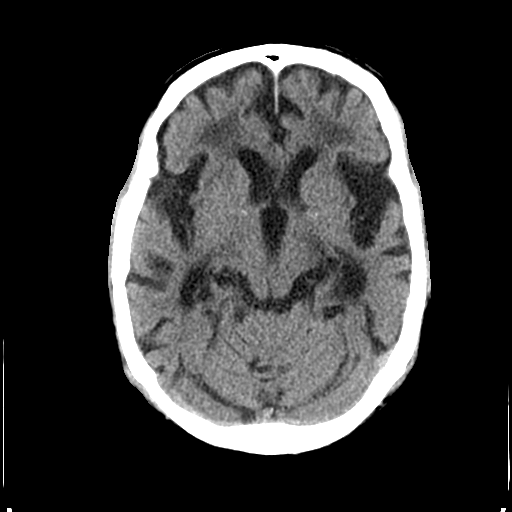
[im 13/30  bone]
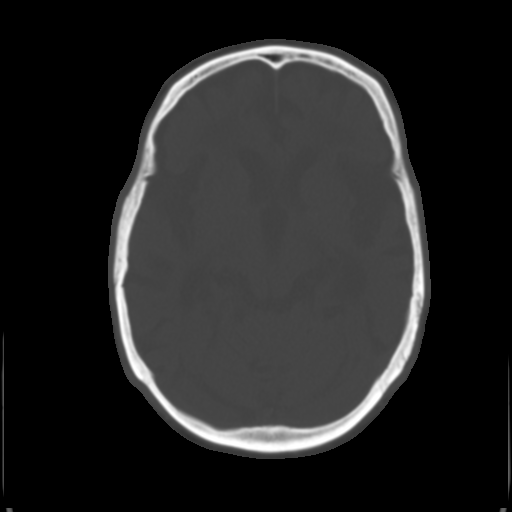
[im 15/30  brain]
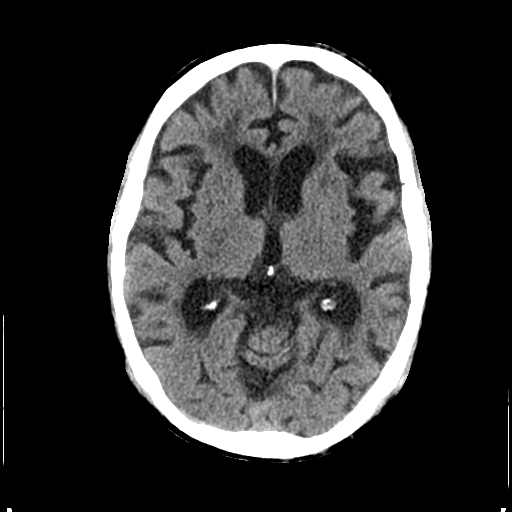
[im 17/30  brain]
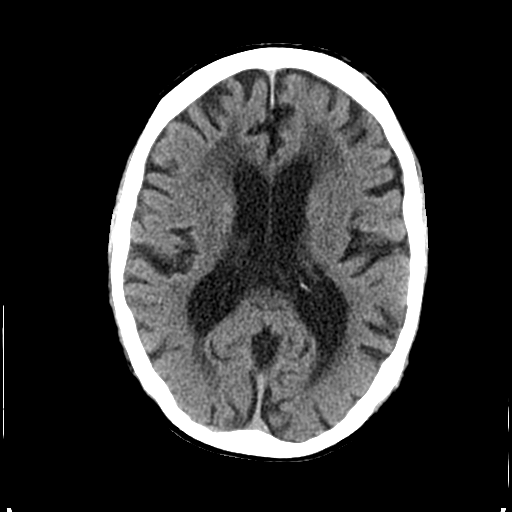
[im 19/30  brain]
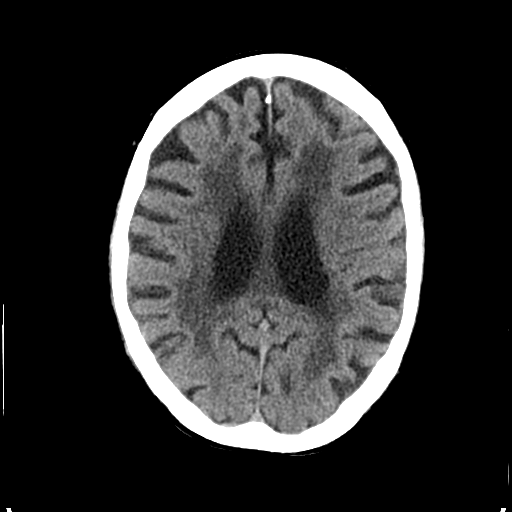
[im 23/30  brain]
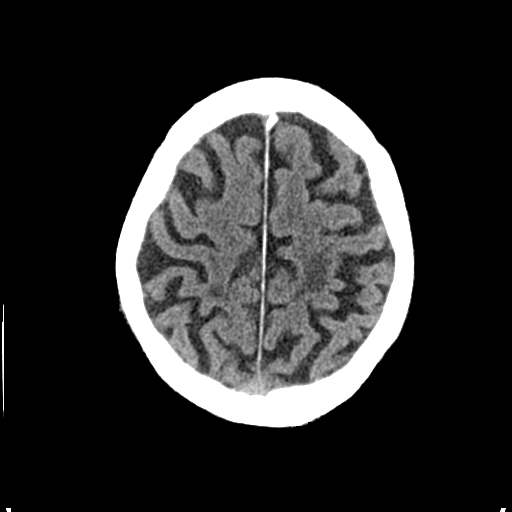
[im 23/30  bone]
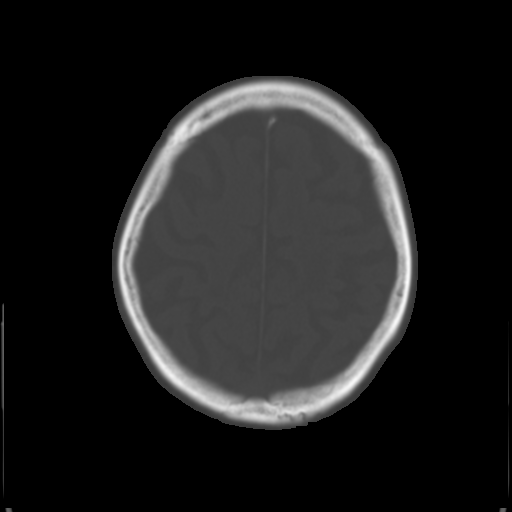
[im 25/30  brain]
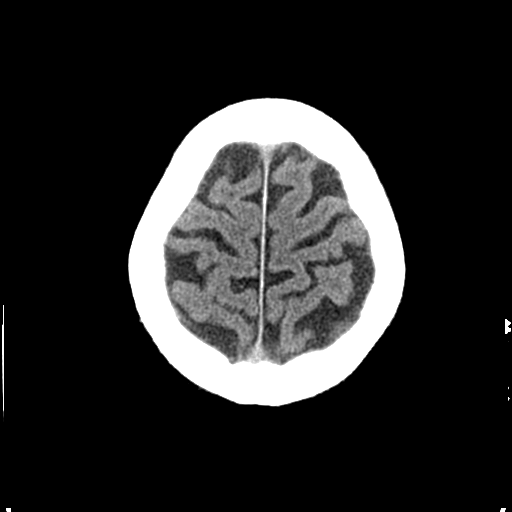
[im 27/30  brain]
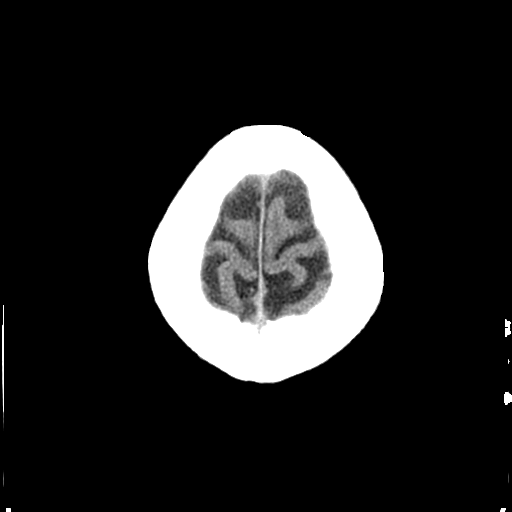

[Series 4: coronal · coronal · 0.29mm/px · 3 of 68 slices shown]
[im 23/68  brain]
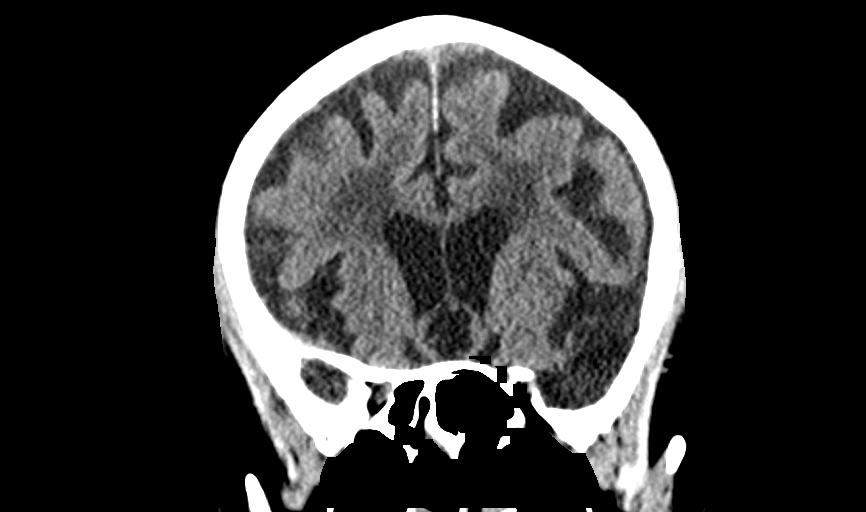
[im 30/68  brain]
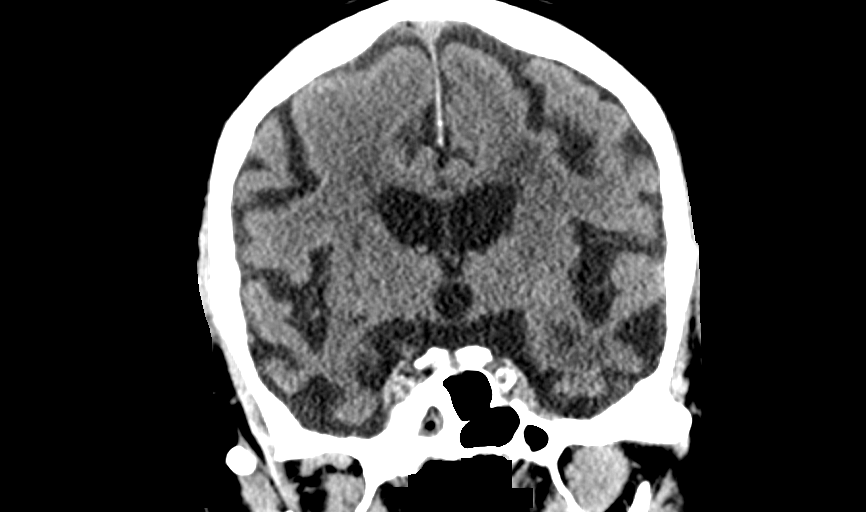
[im 38/68  brain]
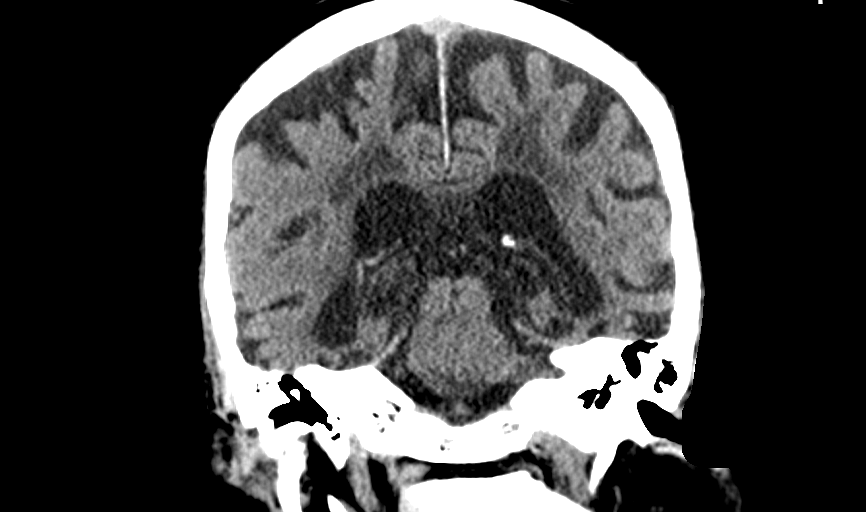

[14 of 47 positions shown; findings below may reference images not displayed]

FINDINGS: Brain: There is moderate diffuse atrophy. There is no intracranial
mass, hemorrhage, extra-axial fluid collection, or midline shift.
There is small vessel disease throughout the centra semiovale
bilaterally. There is evidence of a prior small lacunar infarct at
the genu of the left internal capsule. No acute infarct is
demonstrable on this study.

Vascular: There is no hyperdense vessel. There is calcification in
each carotid siphon region. There is also calcification in the
distal left vertebral artery.

Skull: The bony calvarium appears intact.

Sinuses/Orbits: There is mucosal thickening in several ethmoid air
cells bilaterally. There is opacification throughout much of the
right sphenoid sinus. Other visualized paranasal sinuses are clear.
Orbits appear symmetric bilaterally.

Other: Mastoid air cells are clear.
IMPRESSION: Stable atrophy with periventricular small vessel disease. Prior
small lacunar infarct in the genu of the left internal capsule. No
evidence of intracranial mass, hemorrhage, extra-axial fluid
collection, or acute appearing infarct. There are foci of arterial
vascular calcification. There is paranasal sinus disease, most
notably in the right sphenoid sinus.

## 2017-07-01 ENCOUNTER — Telehealth: Payer: Self-pay

## 2017-07-01 NOTE — Telephone Encounter (Signed)
[facility] physical examination form has been faxed [facility] to [facility]. Pts wife is aware.

## 2017-08-26 ENCOUNTER — Ambulatory Visit (INDEPENDENT_AMBULATORY_CARE_PROVIDER_SITE_OTHER): Payer: Medicare Other | Admitting: Family

## 2017-08-26 ENCOUNTER — Encounter: Payer: Self-pay | Admitting: Family

## 2017-08-26 VITALS — BP 150/82 | HR 67 | Resp 16 | Ht 71.0 in | Wt 165.0 lb

## 2017-08-26 DIAGNOSIS — L0292 Furuncle, unspecified: Secondary | ICD-10-CM

## 2017-08-26 MED ORDER — DOXYCYCLINE HYCLATE 100 MG PO TABS: 100.0000 mg | ORAL_TABLET | Freq: Two times a day (BID) | ORAL | 0 refills | Status: DC

## 2017-08-26 NOTE — Progress Notes (Signed)
Subjective:    Patient ID: Christopher Summers, male    DOB: [birth year], 76 y.o.   MRN: [id number]  Chief Complaint  Patient presents with  . Boil    has a boil on his back that keep reoccuring    HPI:  Christopher Summers is a 76 y.o. male who  has a past medical history of Alzheimer's disease; BPH (benign prostatic hypertrophy); Dementia; Depression; Essential hypertension, benign; and Reflux. and presents today for an office visit.   Continues to experience the associated symptom of boils located in multiple areas including his back, lower abdomen and behind his right ear that has been going on for a couple of weeks. Previously completed course of doxycyline which has improved his symptoms. No fevers. Has been refractory to Hibiclens.    Allergies  Allergen Reactions  . Niacin And Related Other (See Comments)    Turned red all over  . Terazosin     From [facility]      Outpatient Medications Prior to Visit  Medication Sig Dispense Refill  . acetaminophen (TYLENOL) 500 MG tablet Take 500 mg by mouth every 6 (six) hours as needed (pain).    Marland Kitchen diclofenac sodium (VOLTAREN) 1 % GEL Apply 2 g topically 4 (four) times daily. 100 g 6  . divalproex (DEPAKOTE ER) 250 MG 24 hr tablet Take 250 mg by mouth daily.     Marland Kitchen donepezil (ARICEPT) 10 MG tablet Take 10 mg by mouth at bedtime.     . finasteride (PROSCAR) 5 MG tablet TAKE 1 TABLET BY MOUTH DAILY 30 tablet 5  . hydrOXYzine (ATARAX/VISTARIL) 10 MG tablet Take 10 mg by mouth 2 (two) times daily.     . memantine (NAMENDA) 10 MG tablet Take 10 mg by mouth daily.    . polyethylene glycol (MIRALAX / GLYCOLAX) packet Take 17 g by mouth daily. 30 each 0  . QUEtiapine Fumarate (SEROQUEL PO) Take 25 mg by mouth at bedtime.     . sertraline (ZOLOFT) 50 MG tablet Take 50 mg by mouth daily.    . traZODone (DESYREL) 50 MG tablet Take 50 mg by mouth at bedtime.    Marland Kitchen doxycycline (VIBRA-TABS) 100 MG tablet Take 1 tablet (100 mg total) by mouth 2 (two) times  daily. 40 tablet 0   No facility-administered medications prior to visit.       Past Surgical History:  Procedure Laterality Date  . HIP ARTHROPLASTY Right 10/09/2014   Procedure: Right Hemiarthroplasty;  Surgeon: Rozanna Box, MD;  Location: [place];  Service: Orthopedics;  Laterality: Right;  . TRANSURETHRAL RESECTION OF PROSTATE        Past Medical History:  Diagnosis Date  . Alzheimer's disease   . BPH (benign prostatic hypertrophy)   . Dementia   . Depression   . Essential hypertension, benign   . Reflux       Review of Systems  Unable to perform ROS: Dementia      Objective:    BP (!) 150/82 (BP Location: Left Arm, Patient Position: Sitting, Cuff Size: Normal)   Pulse 67   Resp 16   Ht 5\' 11"  (1.803 m)   Wt 165 lb (74.8 kg)   BMI 23.01 kg/m  Nursing note and vital signs reviewed.  Physical Exam  Constitutional: He is oriented to person, place, and time. He appears well-developed and well-nourished. No distress.  Cardiovascular: Normal rate, regular rhythm, normal heart sounds and intact distal pulses.   Pulmonary/Chest: Effort normal  and breath sounds normal.  Neurological: He is alert and oriented to person, place, and time.  Skin: Skin is warm and dry.  Approximately half-dollar-sized annular lesion elevated with reddish purple coloring an opening in the center located in the center of the lower thoracic and upper lumbar spine with tenderness to the touch. Several smaller lesions noted.  Psychiatric: He has a normal mood and affect. His behavior is normal. Judgment and thought content normal.       Assessment & Plan:   Problem List Items Addressed This Visit      Musculoskeletal and Integument   Boil - Primary    Symptoms remain consistent with multiple boils/cysts. Patient unlikely to tolerate incision and drainage. Start doxycycline. If symptoms worsen or do not improve consider referral to dermatology for additional treatment. Continue cleansing  with soap and water and Hibiclens weekly.          I am having Mr. Cournoyer maintain his memantine, donepezil, sertraline, hydrOXYzine, traZODone, finasteride, QUEtiapine Fumarate (SEROQUEL PO), diclofenac sodium, polyethylene glycol, divalproex, acetaminophen, and doxycycline.   Meds ordered this encounter  Medications  . doxycycline (VIBRA-TABS) 100 MG tablet    Sig: Take 1 tablet (100 mg total) by mouth 2 (two) times daily.    Dispense:  20 tablet    Refill:  0    Order Specific Question:   Supervising Provider    Answer:   Pricilla Holm A [[id number]]     Follow-up: Return if symptoms worsen or fail to improve.  Mauricio Po, FNP

## 2017-08-26 NOTE — Assessment & Plan Note (Addendum)
Symptoms remain consistent with multiple boils/cysts. Patient unlikely to tolerate incision and drainage. Start doxycycline. If symptoms worsen or do not improve consider referral to dermatology for additional treatment. Continue cleansing with soap and water and Hibiclens weekly.

## 2017-08-26 NOTE — Patient Instructions (Addendum)
Thank you for choosing [facility].  SUMMARY AND INSTRUCTIONS:  Please continue to take your medicaitons as prescribed.  A referral to dermatology has been placed.   Please continue to cleans with soap and water.   Increase Hibiclens to twice weekly.   Medication:  Your prescription(s) have been submitted to your pharmacy or been printed and provided for you. Please take as directed and contact our office if you believe you are having problem(s) with the medication(s) or have any questions.  Follow up:  If your symptoms worsen or fail to improve, please contact our office for further instruction, or in case of emergency go directly to the emergency room at the closest medical facility.

## 2017-10-14 ENCOUNTER — Encounter: Payer: Self-pay | Admitting: Nurse Practitioner

## 2017-10-14 ENCOUNTER — Ambulatory Visit (INDEPENDENT_AMBULATORY_CARE_PROVIDER_SITE_OTHER): Payer: Medicare Other | Admitting: Nurse Practitioner

## 2017-10-14 VITALS — BP 134/84 | HR 74 | Resp 16 | Ht 71.0 in | Wt 162.0 lb

## 2017-10-14 DIAGNOSIS — L0292 Furuncle, unspecified: Secondary | ICD-10-CM | POA: Diagnosis not present

## 2017-10-14 DIAGNOSIS — F0281 Dementia in other diseases classified elsewhere with behavioral disturbance: Secondary | ICD-10-CM | POA: Diagnosis not present

## 2017-10-14 DIAGNOSIS — G309 Alzheimer's disease, unspecified: Secondary | ICD-10-CM

## 2017-10-14 MED ORDER — CEPHALEXIN 500 MG PO CAPS: 500.0000 mg | ORAL_CAPSULE | Freq: Three times a day (TID) | ORAL | 0 refills | Status: DC

## 2017-10-14 MED ORDER — ALPRAZOLAM 0.5 MG PO TABS: 0.5000 mg | ORAL_TABLET | Freq: Two times a day (BID) | ORAL | 0 refills | Status: DC | PRN

## 2017-10-14 NOTE — Progress Notes (Signed)
Subjective:    Patient ID: Christopher Summers, male    DOB: 1941-09-04, 76 y.o.   MRN: [id number]  HPI Christopher Summers is a 76 yo male who presents today to establish care. He is transferring to me from another provider in the same clinic. He has advanced alzheimer's dementia. His wife brought him in today for annual physical but unable today due to patients unwillingness to cooperate for examination or testing.  Alzheimers dementia- maintained on namenda, aricept. His wife says hes been incresasingly unccooperative this year. He often refuses showers or other ADLS, but does eat well and remains alert and active. He is able to ambulate easily and can get dressed without much physical assistance. He does need constant redirection and verbal instructions. He often does not listen to her verbal instructions and she "pretty much lets him do whatever he wants" to avoid aggravating him. He does get irritated often but has not physcially harmed himself or others. His wife has been considering long term care for him but she is not sure she is ready to take that step yet. She takes him to day care daily so that she is able to have respite and take care of herself. They are both well groomed and have a good appearance today.  Boil- buttock- Hes recently over the past year been developing increasing boils to various parts of his body. Hes not been agreeable to incision and drainage and has been treated with oral doxycycline with resolution of the boils. His wife noticed a pus filled boil to his buttocks about 2 days ago. He will not allow her to place warm compresses to the area. The area has not been draining.  Review of Systems  Constitutional: Negative for activity change, chills and fever.  HENT: Positive for dental problem.   Eyes: Negative for discharge and redness.  Respiratory: Negative for cough.   Cardiovascular: Negative for leg swelling.  Gastrointestinal: Negative for constipation and diarrhea.    Endocrine: Negative for cold intolerance and heat intolerance.  Genitourinary: Positive for enuresis. Negative for difficulty urinating.  Musculoskeletal: Positive for arthralgias.  Skin: Negative for rash.  Allergic/Immunologic: Negative for environmental allergies and food allergies.  Neurological: Negative for headaches.  Hematological: Does not bruise/bleed easily.  Psychiatric/Behavioral: Positive for agitation and confusion.     Past Medical History:  Diagnosis Date  . Alzheimer's disease   . BPH (benign prostatic hypertrophy)   . Dementia   . Depression   . Essential hypertension, benign   . Reflux      Social History   Social History  . Marital status: Married    Spouse name: N/A  . Number of children: 2  . Years of education: 11   Occupational History  . Retired    Social History Main Topics  . Smoking status: Former Smoker    Packs/day: 0.50    Types: Cigarettes    Quit date: 10/09/2014  . Smokeless tobacco: Never Used  . Alcohol use No  . Drug use: No  . Sexual activity: Not on file   Other Topics Concern  . Not on file   Social History Narrative   Fun: Likes to stay at home.   Denies any religious beliefs effecting health care.     Past Surgical History:  Procedure Laterality Date  . HIP ARTHROPLASTY Right 10/09/2014   Procedure: Right Hemiarthroplasty;  Surgeon: Rozanna Box, MD;  Location: [place];  Service: Orthopedics;  Laterality: Right;  . TRANSURETHRAL RESECTION  OF PROSTATE      Family History  Problem Relation Age of Onset  . Heart disease Brother   . Heart attack Father     Allergies  Allergen Reactions  . Niacin And Related Other (See Comments)    Turned red all over  . Terazosin     From [facility]    Current Outpatient Prescriptions on File Prior to Visit  Medication Sig Dispense Refill  . acetaminophen (TYLENOL) 500 MG tablet Take 500 mg by mouth every 6 (six) hours as needed (pain).    Marland Kitchen diclofenac sodium (VOLTAREN) 1 %  GEL Apply 2 g topically 4 (four) times daily. 100 g 6  . divalproex (DEPAKOTE ER) 250 MG 24 hr tablet Take 250 mg by mouth daily.     Marland Kitchen donepezil (ARICEPT) 10 MG tablet Take 10 mg by mouth at bedtime.     Marland Kitchen doxycycline (VIBRA-TABS) 100 MG tablet Take 1 tablet (100 mg total) by mouth 2 (two) times daily. 20 tablet 0  . finasteride (PROSCAR) 5 MG tablet TAKE 1 TABLET BY MOUTH DAILY 30 tablet 5  . hydrOXYzine (ATARAX/VISTARIL) 10 MG tablet Take 10 mg by mouth 2 (two) times daily.     . memantine (NAMENDA) 10 MG tablet Take 10 mg by mouth daily.    . polyethylene glycol (MIRALAX / GLYCOLAX) packet Take 17 g by mouth daily. 30 each 0  . QUEtiapine Fumarate (SEROQUEL PO) Take 25 mg by mouth at bedtime.     . sertraline (ZOLOFT) 50 MG tablet Take 50 mg by mouth daily.    . traZODone (DESYREL) 50 MG tablet Take 50 mg by mouth at bedtime.     No current facility-administered medications on file prior to visit.     BP 134/84 (BP Location: Left Arm, Patient Position: Sitting, Cuff Size: Large)   Pulse 74   Resp 16   Ht 5\' 11"  (1.803 m)   Wt 162 lb (73.5 kg)   SpO2 98%   BMI 22.59 kg/m       Objective:   Physical Exam  Constitutional: He appears well-developed and well-nourished.  Cardiovascular: Normal rate, regular rhythm and intact distal pulses.   Pulmonary/Chest: Effort normal and breath sounds normal.  Neurological: He is alert. Coordination and gait normal.  Skin: Skin is warm and dry.  Pus filled, erythematous pustule to upper buttocks/lower back  Psychiatric:  Uncooperative, does not follow commands. Can be redirected at times.      Assessment & Plan:  Boil- upper buttocks, lower back erythematous pustule. Difficulty to visualize due to patients unwillingness to cooperate. hes been on doxycyline multiple times in the psat for boils. Will prescribe keflex 500mg  tid today with concern for doxycycline resistance. Also given a prescription for xanax BID for mood. Wife will bring the  patient back on Monday for follow up to see if he will allow I&D of abscess. Encouraged to apply warm compresses at home if patient will allow

## 2017-10-14 NOTE — Patient Instructions (Signed)
Please schedule a follow up for Monday. Well see if he is calmer so that we can take a better look at the boil and get some lab work.  He may take the xanax twice daily as needed for agitation.  Please start keflex three times daily for the boil. Try your best to keep warm compresses on the boil to allow it to drain.  It was nice to meet you. Thanks for letting me take care of you today :)

## 2017-10-14 NOTE — Progress Notes (Deleted)
Subjective:   Christopher Summers is a 76 y.o. male who presents for an Initial Medicare Annual Wellness Visit.  Review of Systems  No ROS.  Medicare Wellness Visit. Additional risk factors are reflected in the social history.    Sleep patterns: {SX; SLEEP PATTERNS:18802::"feels rested on waking","does not get up to void","gets up *** times nightly to void","sleeps *** hours nightly"}.    Home Safety/Smoke Alarms: Feels safe in home. Smoke alarms in place.  Living environment; residence and Firearm Safety: {Rehab home environment / accessibility:30080::"no firearms","firearms stored safely"}. Seat Belt Safety/Bike Helmet: Wears seat belt.    Objective:    There were no vitals filed for this visit. There is no height or weight on file to calculate BMI.  Current Medications (verified) Outpatient Encounter Prescriptions as of 10/14/2017  Medication Sig  . acetaminophen (TYLENOL) 500 MG tablet Take 500 mg by mouth every 6 (six) hours as needed (pain).  Marland Kitchen diclofenac sodium (VOLTAREN) 1 % GEL Apply 2 g topically 4 (four) times daily.  . divalproex (DEPAKOTE ER) 250 MG 24 hr tablet Take 250 mg by mouth daily.   Marland Kitchen donepezil (ARICEPT) 10 MG tablet Take 10 mg by mouth at bedtime.   Marland Kitchen doxycycline (VIBRA-TABS) 100 MG tablet Take 1 tablet (100 mg total) by mouth 2 (two) times daily.  . finasteride (PROSCAR) 5 MG tablet TAKE 1 TABLET BY MOUTH DAILY  . hydrOXYzine (ATARAX/VISTARIL) 10 MG tablet Take 10 mg by mouth 2 (two) times daily.   . memantine (NAMENDA) 10 MG tablet Take 10 mg by mouth daily.  . polyethylene glycol (MIRALAX / GLYCOLAX) packet Take 17 g by mouth daily.  . QUEtiapine Fumarate (SEROQUEL PO) Take 25 mg by mouth at bedtime.   . sertraline (ZOLOFT) 50 MG tablet Take 50 mg by mouth daily.  . traZODone (DESYREL) 50 MG tablet Take 50 mg by mouth at bedtime.   No facility-administered encounter medications on file as of 10/14/2017.     Allergies (verified) Niacin and related and  Terazosin   History: Past Medical History:  Diagnosis Date  . Alzheimer's disease   . BPH (benign prostatic hypertrophy)   . Dementia   . Depression   . Essential hypertension, benign   . Reflux    Past Surgical History:  Procedure Laterality Date  . HIP ARTHROPLASTY Right 10/09/2014   Procedure: Right Hemiarthroplasty;  Surgeon: Rozanna Box, MD;  Location: [facility];  Service: Orthopedics;  Laterality: Right;  . TRANSURETHRAL RESECTION OF PROSTATE     Family History  Problem Relation Age of Onset  . Heart disease Brother   . Heart attack Father    Social History   Occupational History  . Retired    Social History Main Topics  . Smoking status: Former Smoker    Packs/day: 0.50    Types: Cigarettes    Quit date: 10/09/2014  . Smokeless tobacco: Never Used  . Alcohol use No  . Drug use: No  . Sexual activity: Not on file   Tobacco Counseling Counseling given: Not Answered   Activities of Daily Living No flowsheet data found.  Immunizations and Health Maintenance Immunization History  Administered Date(s) Administered  . Tdap 06/15/2016   Health Maintenance Due  Topic Date Due  . COLONOSCOPY  11/01/2007  . PNA vac Low Risk Adult (1 of 2 - PCV13) 11/25/2007  . INFLUENZA VACCINE  07/14/2017    Patient Care Team: Golden Circle, FNP as PCP - General (Family Medicine)  Indicate  any recent Medical Services you may have received from other than [facility] providers in the past year (date may be approximate).    Assessment:   This is a routine wellness examination for Christopher Summers.Physical assessment deferred to PCP.   Hearing/Vision screen No exam data present  Dietary issues and exercise activities discussed:   Diet (meal preparation, eat out, water intake, caffeinated beverages, dairy products, fruits and vegetables): {Desc; diets:16563}    Goals    None     Depression Screen PHQ 2/9 Scores 06/30/2016  PHQ - 2 Score 0    Fall Risk Fall Risk  06/30/2016   Falls in the past year? No    Cognitive Function:        Screening Tests Health Maintenance  Topic Date Due  . COLONOSCOPY  11/01/2007  . PNA vac Low Risk Adult (1 of 2 - PCV13) 11/25/2007  . INFLUENZA VACCINE  07/14/2017  . TETANUS/TDAP  06/15/2026        Plan:     I have personally reviewed and noted the following in the patient's chart:   . Medical and social history . Use of alcohol, tobacco or illicit drugs  . Current medications and supplements . Functional ability and status . Nutritional status . Physical activity . Advanced directives . List of other physicians . Vitals . Screenings to include cognitive, depression, and falls . Referrals and appointments  In addition, I have reviewed and discussed with patient certain preventive protocols, quality metrics, and best practice recommendations. A written personalized care plan for preventive services as well as general preventive health recommendations were provided to patient.     Michiel Cowboy, RN   10/14/2017

## 2017-10-14 NOTE — Progress Notes (Deleted)
Pre visit review using our clinic review tool, if applicable. No additional management support is needed unless otherwise documented below in the visit note. 

## 2017-10-16 NOTE — Assessment & Plan Note (Signed)
Increasingly Uncooperative this year. Wife cares for him at home and is considering long term care. Will try a 5 day course of xanax BID to see if this calms his mood and increases his cooperation.

## 2017-10-18 ENCOUNTER — Telehealth: Payer: Self-pay | Admitting: Nurse Practitioner

## 2017-10-18 ENCOUNTER — Ambulatory Visit (INDEPENDENT_AMBULATORY_CARE_PROVIDER_SITE_OTHER): Payer: Medicare Other | Admitting: Nurse Practitioner

## 2017-10-18 ENCOUNTER — Encounter: Payer: Self-pay | Admitting: Nurse Practitioner

## 2017-10-18 VITALS — BP 126/82 | HR 81 | Resp 16 | Ht 71.0 in | Wt 161.0 lb

## 2017-10-18 DIAGNOSIS — F0281 Dementia in other diseases classified elsewhere with behavioral disturbance: Secondary | ICD-10-CM

## 2017-10-18 DIAGNOSIS — G309 Alzheimer's disease, unspecified: Secondary | ICD-10-CM | POA: Diagnosis not present

## 2017-10-18 MED ORDER — ALPRAZOLAM 0.5 MG PO TABS: 0.5000 mg | ORAL_TABLET | ORAL | 0 refills | Status: AC | PRN

## 2017-10-18 NOTE — Progress Notes (Addendum)
Subjective:    Patient ID: Christopher Summers, male    DOB: 1941-07-08, 76 y.o.   MRN: [id number]  HPI Mr. Jurney is a 76 yo male who presents today for a follow up. He has advanced alzheimers disease. He is accompanied by his wife and friend. He returns today for follow up of a boil. The boil has been present for about one week. He was seen here last week for the boil but was not able to cooperate for examination or treatment. He was prescribed a short course of xanax BID for agitation and a follow up appointment to assess his willingness for treatment of the boil. Over the last 5 days his wife says hes been more compliant with the xanax BID. hes been following some of her commands and even took a shower on his own, which he had not done in some time prior. Today, his mood is fairly similar to his most recent office visit. He is not willing to cooperate for examination or treatment. His wife reports the boil did open this weekend and has been draining pus. She has not been able to apply warm compresses due to patients unwillingness. She does feel like the boil is improving and "shrinking up" on the oral antibiotic. The patient has been taking the antibiotic when given   Review of Systems  See HPI  Past Medical History:  Diagnosis Date  . Alzheimer's disease   . BPH (benign prostatic hypertrophy)   . Dementia   . Depression   . Essential hypertension, benign   . Reflux      Social History   Socioeconomic History  . Marital status: Married    Spouse name: Not on file  . Number of children: 2  . Years of education: 78  . Highest education level: Not on file  Social Needs  . Financial resource strain: Not on file  . Food insecurity - worry: Not on file  . Food insecurity - inability: Not on file  . Transportation needs - medical: Not on file  . Transportation needs - non-medical: Not on file  Occupational History  . Occupation: Retired  Tobacco Use  . Smoking status: Former  Smoker    Packs/day: 0.50    Types: Cigarettes    Last attempt to quit: 10/09/2014    Years since quitting: 3.0  . Smokeless tobacco: Never Used  Substance and Sexual Activity  . Alcohol use: No  . Drug use: No  . Sexual activity: Not on file  Other Topics Concern  . Not on file  Social History Narrative   Fun: Likes to stay at home.   Denies any religious beliefs effecting health care.     Past Surgical History:  Procedure Laterality Date  . TRANSURETHRAL RESECTION OF PROSTATE      Family History  Problem Relation Age of Onset  . Heart disease Brother   . Heart attack Father     Allergies  Allergen Reactions  . Niacin And Related Other (See Comments)    Turned red all over  . Terazosin     From [facility]    Current Outpatient Medications on File Prior to Visit  Medication Sig Dispense Refill  . acetaminophen (TYLENOL) 500 MG tablet Take 500 mg by mouth every 6 (six) hours as needed (pain).    Marland Kitchen ALPRAZolam (XANAX) 0.5 MG tablet Take 1 tablet (0.5 mg total) by mouth 2 (two) times daily as needed for anxiety. 10 tablet 0  . cephALEXin (  KEFLEX) 500 MG capsule Take 1 capsule (500 mg total) by mouth 3 (three) times daily. 21 capsule 0  . diclofenac sodium (VOLTAREN) 1 % GEL Apply 2 g topically 4 (four) times daily. 100 g 6  . divalproex (DEPAKOTE ER) 250 MG 24 hr tablet Take 250 mg by mouth daily.     Marland Kitchen donepezil (ARICEPT) 10 MG tablet Take 10 mg by mouth at bedtime.     Marland Kitchen doxycycline (VIBRA-TABS) 100 MG tablet Take 1 tablet (100 mg total) by mouth 2 (two) times daily. 20 tablet 0  . finasteride (PROSCAR) 5 MG tablet TAKE 1 TABLET BY MOUTH DAILY 30 tablet 5  . hydrOXYzine (ATARAX/VISTARIL) 10 MG tablet Take 10 mg by mouth 2 (two) times daily.     . memantine (NAMENDA) 10 MG tablet Take 10 mg by mouth daily.    . polyethylene glycol (MIRALAX / GLYCOLAX) packet Take 17 g by mouth daily. 30 each 0  . QUEtiapine Fumarate (SEROQUEL PO) Take 25 mg by mouth at bedtime.     .  sertraline (ZOLOFT) 50 MG tablet Take 50 mg by mouth daily.    . traZODone (DESYREL) 50 MG tablet Take 50 mg by mouth at bedtime.     No current facility-administered medications on file prior to visit.     BP 126/82 (BP Location: Left Arm, Patient Position: Sitting, Cuff Size: Large)   Pulse 81   Resp 16   Ht 5\' 11"  (1.803 m)   Wt 161 lb (73 kg)   SpO2 93%   BMI 22.45 kg/m       Objective:   Physical Exam  Constitutional: He appears well-developed and well-nourished. He is uncooperative.  HENT:  Head: Normocephalic and atraumatic.  Cardiovascular: Intact distal pulses.  Pulmonary/Chest: Effort normal. No respiratory distress.  Neurological: He is alert.  He is not oriented to person, place or time  Skin: Skin is warm and dry.  Reddened abscess to right buttock with central, open area of purlent drainage.   Psychiatric: His affect is labile. He is agitated. Cognition and memory are impaired. He is noncommunicative. He exhibits abnormal recent memory and abnormal remote memory.      Assessment & Plan:  Alzheimer's dementia with behavioral disturbance, unspecified timing of dementia onset- patient remains unwilling to cooperate for examination or treatment. I am concerned for the patient and  his wife as hu wife seems to be trying her best to care for the patient at home but he does refuse some of her care and thus may have infections or medical problems that she can not help treat. He does look well cared for today. His wife reports this behavior has been getting worse over this past year, this does not seem like an acute change. She has considered long term care for him but has called many resources and can not find a place for him. I have placed a referral to [facility] today for social work consultation, as well as sending wife a package of phone numbers and community resources to her home. I provided a refill of xanax TID PRN for the patient for agitation as the wife felt this improved  his mood during a 5 day course. The wife will continue administer oral antibiotics until complete and monitor the abscess for any worsening .

## 2017-10-18 NOTE — Patient Instructions (Addendum)
I have placed a referral to [employer] for a social work consultation. Our office will call you to schedule this. In the meantime, I would like for you to continue calling any resources you have for long term care. You may also try to contact county social services.   Please follow up for increasing swelling or streaking redness from the abscess.   I have provided you with a refill of xanax to use as needed up to three times daily to help with mood and agitation.

## 2017-10-19 ENCOUNTER — Other Ambulatory Visit: Payer: Self-pay

## 2017-10-19 ENCOUNTER — Encounter: Payer: Self-pay | Admitting: *Deleted

## 2017-10-19 NOTE — Telephone Encounter (Signed)
In addition to the referral to [facility], I have also placed a referral to social work. I am not sure if he is eligible for [facility] and I want to be sure that someone reaches out to her. Can we also verify who is prescribing the patients Depakote, zoloft, seroquel and trazodone?

## 2017-10-19 NOTE — Addendum Note (Signed)
Addended by: Lance Sell on: 10/19/2017 10:12 AM   Modules accepted: Orders

## 2017-10-19 NOTE — Telephone Encounter (Signed)
Pts wife is aware of referral and the packet of resources being sent in the mail. Wife states that he gets all the medications he is on from the [place].

## 2017-10-19 NOTE — Patient Outreach (Signed)
[facility]) Care Management  10/19/2017  HAWKINS SEAMAN 1941-07-13 [id number]   Referral Date: 10/19/2017 Referral Source: Physician Office Referral Reason: Assist with placement and resources.   Outreach Attempt: First attempt spoke with wife.  She is able to verify HIPAA. She states that patient is getting too much for her at home.  She states that his alzheimer's continues to progress.  She reports that patient will not cooperate at times and is incontinent as well and refuses care a lot.  Wife looking for assistance with placement and resources while in the home.   Social: Patient and wife live in the home and son and his family live there as well but their assistance is limited due to working schedules.     Conditions: Wife reports patient has alzheimer's.  She denies any other major health issues.     Medications: Wife denies any problems with medications.    Appointments: Wife takes patient to appointments.     Consent: Wife is consenting to services to help with placement and resources in the home to help with patient.   Plan: RN CM will send referral for social to assist with placement and resources while patient in the home.   RN CM will sign off at this time.    Jone Baseman, RN, MSN [facility] Care Management Care Management Coordinator Direct Line [phone] Toll Free: [phone]  Fax: [phone]

## 2017-10-21 ENCOUNTER — Other Ambulatory Visit: Payer: Self-pay | Admitting: *Deleted

## 2017-10-21 NOTE — Patient Outreach (Signed)
[facility]) Care Management  10/21/2017  Christopher Summers 09-07-1941 [id number]   CSW made an initial attempt to try and contact patient today to perform phone assessment, as well as assess and assist with social needs and services, without success.  A HIPAA compliant message was left for patient on voicemail.  CSW is currently awaiting a return call. CSW will make a second outreach attempt within the next week, if CSW does not receive a return call from patient in the meantime. [name], [name], [name], [name]  Licensed Education officer, environmental Health System  Mailing [place] [address], [place], [place] [address] Physical Address-[address], [place], [place] [address] Toll Free Main # [phone] Fax # [phone] Cell # [phone]  Office # [phone] [email or web address]

## 2017-10-25 ENCOUNTER — Encounter: Payer: Self-pay | Admitting: *Deleted

## 2017-10-25 ENCOUNTER — Other Ambulatory Visit: Payer: Self-pay | Admitting: *Deleted

## 2017-10-25 NOTE — Telephone Encounter (Signed)
noted 

## 2017-10-25 NOTE — Patient Outreach (Signed)
[facility]) Care Management  10/25/2017  Christopher Summers 1941-12-12 [id number]   CSW was able to make initial contact with patient's wife, Christopher Summers today to perform the phone assessment, as well as assess and assist with social work needs and services.  CSW introduced self, explained role and types of services provided through [facility] ([facility]).  CSW further explained to Mrs. Conery that CSW works with patient's Nurse Practitioner, Caesar Chestnut.  CSW then explained the reason for the call, indicating that Mrs. Gayla Medicus thought that patient would benefit from social work services and resources to assist with arranging long-term care placement arrangements for patient.  CSW obtained two HIPAA compliant identifiers from Mrs. Szafranski, which included patient's name, date of birth and home address. Mrs. Naval admits that she and her son, Christopher Summers are no longer able to manage patient's care in the home.  Mrs. Pellicane went on to say that patient is total care, unable to bath, dress, feed or care for himself independently.  Mrs. Capaldi further reported that patient is to the point that he requires 24 hour care and supervision, which she and Christopher Summers are unable to provide.  Mrs. Krinke indicated that patient is playing in his feces and becomes combative with her and Christopher Summers at times.  Mrs. Stoiber stated, "I don't know what else to do, other than to have him placed, but I can't afford it".  CSW offered counseling and supportive services, where appropriate, as CSW could tell that Mrs. Minus was getting upset and frustrated with the system. CSW had a very lengthy conversation with Mrs. Mangan about applying for Long-Term Care Medicaid coverage for patient at the [place].  Mrs. Schwer reported that she tried to apply for Adult Medicaid, but was immediately denied once her and patient's  combined income was discussed.  CSW explained to Mrs. Struckman the difference between Adult Medicaid and Long-Term Care Medicaid and how patient may actually be eligible for one but not the other, based on income status.  Mrs. Jablonowski is concerned about losing her house and all her assets, which is not the case when applying for special assistance for a spouse.  CSW explained to Mrs. Taney exactly what she would need to say and do when applying for Long-Term Care Medicaid.  CSW also explained what information she would need to take with her when she goes to apply. In the meantime, CSW agreed to contact patient's Nurse Practitioner, Caesar Chestnut to request a completed and signed FL-2 Form for patient.  Once the FL-2 Form is obtained, CSW will fax to all long-term care facilities within a 25-mile radius.  Once bed offers are received, Mrs. Ambroise has agreed to go and tour the facilities of interest in an attempt to pursue placement options for patient.  Mrs. Kopka was really stuck on her and patient's combined income, continuing to revert back to how they are well over-qualified for any type of financial assistance.  Patient currently receives [place], Public affairs consultant and Aid and Therapist, sports through [employer].  CSW explained to Mrs. Pietrzak that she is allowed to keep her house, one vehicle, her [place] and a certain amount of money in the bank and that the majority, if not all, of patient's [place], in addition to [facility], will be taken at the long-term care facility for financial coverage. CSW agreed to follow-up with Mrs. Ogas as soon as  the FL-2 Form is ready for pick-up, as Mrs. Lasky will also need a copy to take with her to the [place] when applying for [employer].  CSW will make a copy and fax out to all facilities of choice.  CSW will ensure that Mrs. Gayla Medicus  is aware that patient's recommended level of care needs to indicate "Custodial Care/Assisted Living".  Mrs. Wojtkiewicz appeared to have a little more hope after speaking with CSW.  [facility] CM Care Plan Problem One     Most Recent Value  Care Plan Problem One  Level of care issues.  Role Documenting the Problem One  Clinical Social Worker  Care Plan for Problem One  Active  [facility] Long Term Goal   Patient will receive placement in a long-term care facility within the next 45 days.  THN Long Term Goal Start Date  10/25/17  Interventions for Problem One Long Term Goal  CSW will assist patient's wife with arranging long-term care placement arrangements for patient.  [facility] CM Short Term Goal #1   CSW will contact patient's Nurse Practitioner to request a completed and signed FL-2 Form for patient, within the next week.  THN CM Short Term Goal #1 Start Date  10/25/17  Interventions for Short Term Goal #1  CSW will pick up the completed and signed FL-2 Form from patient's nurse practitioner once completed.  THN CM Short Term Goal #2   CSW will fax patient's FL-2 Form to all long-term care facilities of choice, within the next week.  THN CM Short Term Goal #2 Start Date  10/25/17  Interventions for Short Term Goal #2  Patient's wife will provide CSW with a list of long-term care facilities of interest.  THN CM Short Term Goal #3  Patient's wife will apply for Long-Term Care Medicaid at the [place], within the next two weeks.  THN CM Short Term Goal #3 Start Date  10/25/17  Interventions for Short Tern Goal #3  Patient's wife has been given an explicit set of instructions on how to apply for Long-Term Care Medicaid.  THN CM Short Term Goal #4  Bed offers will be given to patient's wife, once received, within the next two weeks.  THN CM Short Term Goal #4 Start Date  10/25/17  Interventions for Short Term Goal #4  Patient's wife will make arrangements to go and view list of  facilities that made bef offers.      [name], [name], [name], [name]  Licensed Education officer, environmental Health System  Mailing [place] [address] [place], [place] [address] Physical Address-[address], [place], [place] [address] Toll Free Main # [phone] Fax # [phone] Cell # [phone]  Office # [phone] [email or web address]

## 2017-10-25 NOTE — Patient Outreach (Signed)
Request received from Joanna Saporito, [name] to mail patient personal care resources.  Information mailed today. 

## 2017-10-28 ENCOUNTER — Other Ambulatory Visit: Payer: Self-pay | Admitting: *Deleted

## 2017-10-28 NOTE — Patient Outreach (Signed)
[facility]) Care Management  10/28/2017  Christopher Summers Jul 24, 1941 [id number]   CSW made an attempt to try and contact patient's wife, Christopher Summers today to follow-up regarding long-term care placement arrangements for patient; however, [name] was not available at the time of CSW's call.  A HIPAA compliant message was left on voicemail and CSW is currently awaiting a return call.  CSW will make a second outreach attempt within the next week, if a return call is not received in the meantime. [name], [name], [name], [name]  Licensed Education officer, environmental Health System  Mailing [place] [address] [place], [place] [address] Physical Address-[address], [place], [place] [address] Toll Free Main # [phone] Fax # [phone] Cell # [phone]  Office # [phone] [email or web address]

## 2017-11-02 ENCOUNTER — Telehealth: Payer: Self-pay | Admitting: Nurse Practitioner

## 2017-11-02 ENCOUNTER — Other Ambulatory Visit: Payer: Self-pay | Admitting: *Deleted

## 2017-11-02 NOTE — Addendum Note (Signed)
Addended by: Delice Bison E on: 11/02/2017 01:29 PM   Modules accepted: Orders

## 2017-11-02 NOTE — Telephone Encounter (Signed)
LVM with Joann letting her know my direct fax number and direct phone number to contact me in regards to pt.

## 2017-11-02 NOTE — Patient Outreach (Signed)
Christopher Summers Christopher Summers) Care Management  11/02/2017  Christopher Summers Jul 11, 1941 [id number]   CSW was able to make contact with patient's wife, Christopher Summers today to follow-up regarding long-term care placement arrangements for patient. CSW explained to Christopher Summers that CSW is still awaiting completion of the FL-2 Form from patient's Nurse Practitioner, Christopher Summers.  CSW has left a HIPAA compliant message on voicemail, as well as faxed over a letter with this specific request.  Christopher Summers agreed to contact Christopher Summers office today to request a completed and signed FL-2 Form to try and help expedite the placement process.  As soon as the completed and signed FL-2 Form is received, CSW will fax to all long-term care assisted living facilities of choice to try and pursue bed offers.  Christopher Summers also needs the FL-2 Form to take with her to the [place] to apply for [facility] for patient.  Christopher Summers agreed to contact CSW to report findings of her conversation with Christopher Summers and/or her staff members.  CSW will make arrangements to contact Christopher Summers within the next week, if a return call is not received in the meantime.  Christopher Summers voiced understanding and was agreeable to this plan. THN CM Care Plan Problem One     Most Recent Value  Care Plan Problem One  Level of care issues.  Role Documenting the Problem One  Clinical Social Worker  Care Plan for Problem One  Active  [facility] Long Term Goal   Patient will receive placement in a long-term care facility within the next 45 days.  THN Long Term Goal Start Date  10/25/17  Interventions for Problem One Long Term Goal  CSW will assist patient's wife with arranging long-term care placement arrangements for patient.  THN CM Short Term Goal #1   CSW will contact patient's Nurse Practitioner to request a completed and signed FL-2 Form for patient, within the next week.  THN  CM Short Term Goal #1 Start Date  10/25/17  [facility] CM Short Term Goal #1 Met Date  11/02/17  Interventions for Short Term Goal #1  CSW will pick up the completed and signed FL-2 Form from patient's nurse practitioner once completed.  THN CM Short Term Goal #2   CSW will fax patient's FL-2 Form to all long-term care facilities of choice, within the next week.  THN CM Short Term Goal #2 Start Date  10/25/17  Interventions for Short Term Goal #2  Patient's wife will provide CSW with a list of long-term care facilities of interest.  THN CM Short Term Goal #3  Patient's wife will apply for Long-Term Care Medicaid at the [place], within the next two weeks.  THN CM Short Term Goal #3 Start Date  10/25/17  Interventions for Short Tern Goal #3  Patient's wife has been given an explicit set of instructions on how to apply for Long-Term Care Medicaid.  THN CM Short Term Goal #4  Bed offers will be given to patient's wife, once received, within the next two weeks.  THN CM Short Term Goal #4 Start Date  10/25/17  Interventions for Short Term Goal #4  Patient's wife will make arrangements to go and view list of facilities that made bef offers.     [name], [name], [name], [name]  Licensed Education officer, [name]  Mailing [place] [address], [place], [place]  [place] [place], [place], [place] [address] Toll Free Main # [phone] Fax # [phone] Cell # [phone]  Office # [phone] [email or web address]

## 2017-11-02 NOTE — Telephone Encounter (Signed)
FL2 form received and faxed back to [place] with [facility].

## 2017-11-02 NOTE — Telephone Encounter (Signed)
Patient's wife called to see if we have received the FL2 form. We informed we have not received nothing from [facility]. We will contact Joann @ [phone] to inform.

## 2017-11-08 DIAGNOSIS — Z0279 Encounter for issue of other medical certificate: Secondary | ICD-10-CM

## 2017-11-08 NOTE — Telephone Encounter (Signed)
FL2 has been sent to scan &charged for.

## 2017-11-11 ENCOUNTER — Other Ambulatory Visit: Payer: Self-pay | Admitting: *Deleted

## 2017-11-11 ENCOUNTER — Telehealth: Payer: Self-pay | Admitting: Nurse Practitioner

## 2017-11-11 NOTE — Telephone Encounter (Signed)
Copied from [place]. Topic: Quick Communication - See Telephone Encounter >> Nov 11, 2017  9:14 AM Robina Ade, Helene Kelp D wrote: CRM for notification. See Telephone encounter for: 11/11/17. Joana from [facility] called and said that she is waiting for the [facility] form to be fax to [phone] for her.

## 2017-11-11 NOTE — Patient Outreach (Signed)
[facility]) Care Management  11/11/2017  PHILIP KOTLYAR [birth year] [id number]   CSW was able to make contact with patient's wife, Aum Caggiano today to follow-up regarding long-term care placement arrangements for patient. CSW explained to Mrs. Paradis that CSW was not aware that patient's Primary Care Physician, Dr. Caesar Chestnut had not received the blank FL-2 Form faxed to her office by CSW over a week ago.  Therefore, CSW agreed to hand deliver a blank copy to her office staff, if necessary. Otherwise, CSW went ahead and faxed a new copy today, first ensuring that CSW has the correct fax number for Dr. Kreg Shropshire office.  In addition, CSW emailed a blank copy to Dr. Kreg Shropshire email address.  CSW will await completion of the FL-2 Form. Once the FL-2 Form is received, CSW will fax patient to all Assisted Living Facilities of choice, but only to facilities that offer memory impaired services and have the wander-guard system.  Once bed offers are received, CSW will contact Mr. Masini to notify her of the offers, as well as encourage her to go and tour the facilities.  Mrs. Poitra has agreed to take the completed FL-2 Form to the [place] to apply for [facility] for patient.  CSW will follow-up with Mrs. Delay as soon as the FL-2 Form has been completed and signed. THN CM Care Plan Problem One     Most Recent Value  Care Plan Problem One  Level of care issues.  Role Documenting the Problem One  Clinical Social Worker  Care Plan for Problem One  Active  [facility] Long Term Goal   Patient will receive placement in a long-term care facility within the next 45 days.  THN Long Term Goal Start Date  10/25/17  Interventions for Problem One Long Term Goal  CSW will assist patient's wife with arranging long-term care placement arrangements for patient.  THN CM Short Term Goal #1   CSW will contact patient's Nurse Practitioner to  request a completed and signed FL-2 Form for patient, within the next week.  THN CM Short Term Goal #1 Start Date  10/25/17  [facility] CM Short Term Goal #1 Met Date  11/02/17  Interventions for Short Term Goal #1  CSW will pick up the completed and signed FL-2 Form from patient's nurse practitioner once completed.  THN CM Short Term Goal #2   CSW will fax patient's FL-2 Form to all long-term care facilities of choice, within the next week.  THN CM Short Term Goal #2 Start Date  10/25/17  Interventions for Short Term Goal #2  Patient's wife will provide CSW with a list of long-term care facilities of interest.  THN CM Short Term Goal #3  Patient's wife will apply for Long-Term Care Medicaid at the [place], within the next two weeks.  THN CM Short Term Goal #3 Start Date  10/25/17  Interventions for Short Tern Goal #3  Patient's wife has been given an explicit set of instructions on how to apply for Long-Term Care Medicaid.  THN CM Short Term Goal #4  Bed offers will be given to patient's wife, once received, within the next two weeks.  THN CM Short Term Goal #4 Start Date  10/25/17  Interventions for Short Term Goal #4  Patient's wife will make arrangements to go and view list of facilities that made bef offers.     [name], [place], [name], [employer]  Licensed  Clinical Social Worker  [place]  Mailing [place] [address], [place], [place] [address] Physical Address-[address], [place], [place] [address] Toll Free Main # [phone] Fax # [phone] Cell # [phone]  Office # [phone] [email or web address]

## 2017-11-11 NOTE — Telephone Encounter (Signed)
Please advise Di Kindle has not received FL2

## 2017-11-15 NOTE — Telephone Encounter (Signed)
I have faxed over the form again. It was faxed before on 11/20.

## 2017-11-16 ENCOUNTER — Other Ambulatory Visit: Payer: Self-pay | Admitting: *Deleted

## 2017-11-16 NOTE — Patient Outreach (Signed)
[facility]) Care Management  11/16/2017  CHLOE BAIG 1941-02-28 [id number]   CSW was able to make contact with patient's wife, Rye Decoste today to follow-up regarding long-term care placement arrangements for patient. CSW explained to Mrs. Pellum that CSW received the completed and signed FL-2 Form on patient and was able to fax to all of the following skilled nursing facilities: [place] and [place] and [place] and [place] and [place] and [place] CSW is currently awaiting bed offers.  CSW has emailed Mrs. Shatzer a copy of the completed and signed FL-2 Form and has encouraged her to take it with her to the [place] to apply for [employer].  CSW agreed to follow-up with Mrs. Mcgrady within the next week to provide bed offers. Mrs. Vandenbrink voiced understanding and was agreeable to this plan. THN CM Care Plan Problem One     Most Recent Value  Care Plan Problem One  Level of care issues.  Role Documenting the Problem One  Clinical Social Worker  Care Plan for Problem One  Active  [facility] Long Term Goal   Patient will receive placement in a long-term care facility within the next 45 days.  THN Long Term Goal Start Date  10/25/17  Interventions for Problem One Long Term Goal  CSW will assist patient's wife with arranging long-term care placement arrangements for patient.  THN CM Short Term Goal #1   CSW will contact patient's Nurse Practitioner to request a completed and signed FL-2 Form for patient, within the next week.  THN CM Short Term Goal #1 Start Date  10/25/17  [facility] CM Short Term Goal #1 Met Date  11/02/17  Interventions for  Short Term Goal #1  CSW will pick up the completed and signed FL-2 Form from patient's nurse practitioner once completed.  THN CM Short Term Goal #2   CSW will fax patient's FL-2 Form to all long-term care facilities of choice, within the next week.  THN CM Short Term Goal #2 Start Date  10/25/17  [facility] CM Short Term Goal #2 Met Date  11/16/17  Interventions for Short Term Goal #2  Patient's wife will provide CSW with a list of long-term care facilities of interest.  THN CM Short Term Goal #3  Patient's wife will apply for Long-Term Care Medicaid at the [place], within the next two weeks.  THN CM Short Term Goal #3 Start Date  10/25/17  Interventions for Short Tern Goal #3  Patient's wife has been given an explicit set of instructions on how to apply for Long-Term Care Medicaid.  THN CM Short Term Goal #4  Bed offers will be given to patient's wife, once received, within the next two weeks.  THN CM Short Term Goal #4 Start Date  10/25/17  Interventions for Short Term Goal #4  Patient's wife will make arrangements to go and view list of facilities that made bef offers.     [name], [name], [name], [name]  Licensed Education officer, environmental [facility] System  Mailing [place] [address] [place], [place] [address] Physical Address-[address], [place], [place] [address] Toll Free Main # [phone] Fax # [phone] Cell # [phone]  Office # [phone] [email or web address]

## 2017-11-25 ENCOUNTER — Other Ambulatory Visit: Payer: Self-pay | Admitting: *Deleted

## 2017-11-25 NOTE — Patient Outreach (Signed)
[place] [facility]) Care Management  11/25/2017  Christopher Summers 09-26-1941 [id number]   CSW made an attempt to try and contact patient's wife, [name] today to follow-up regarding long-term care placement arrangements for patient; however, [name] was unavailable at the time of CSW's call.  A HIPAA compliant message was left for [name] on voicemail and CSW is currently awaiting a return call.  CSW will make a second outreach attempt within the next week, if a return call is not received in the meantime. [name], [name], [name], [name]  Licensed Education officer, environmental Health System  Mailing [place] [address], [place], [place] [address] Physical Address-[address], [place], [place] [address] Toll Free Main # [phone] Fax # [phone] Cell # [phone]  Office # [phone] [email or web address]

## 2017-11-29 ENCOUNTER — Ambulatory Visit (INDEPENDENT_AMBULATORY_CARE_PROVIDER_SITE_OTHER): Payer: Medicare Other | Admitting: Nurse Practitioner

## 2017-11-29 ENCOUNTER — Encounter: Payer: Self-pay | Admitting: Nurse Practitioner

## 2017-11-29 ENCOUNTER — Other Ambulatory Visit: Payer: Self-pay | Admitting: *Deleted

## 2017-11-29 VITALS — BP 120/80 | HR 87 | Resp 18 | Ht 71.0 in

## 2017-11-29 DIAGNOSIS — R8290 Unspecified abnormal findings in urine: Secondary | ICD-10-CM

## 2017-11-29 DIAGNOSIS — L03116 Cellulitis of left lower limb: Secondary | ICD-10-CM

## 2017-11-29 MED ORDER — SULFAMETHOXAZOLE-TRIMETHOPRIM 400-80 MG PO TABS: 1.0000 | ORAL_TABLET | Freq: Two times a day (BID) | ORAL | 0 refills | Status: AC

## 2017-11-29 NOTE — Progress Notes (Signed)
Subjective:    Patient ID: Christopher Summers, male    DOB: 1941-08-26, 76 y.o.   MRN: [id number]  HPI Mr. Christopher Summers is a 76 yo male who presents today for an acute visit. He has advanced alzheimer's dementia and comes in today with his wife, who is his primary caregiver. His wife has been having increasing difficulty providing care for the patient at home and is currently working with [facility] social work for long term care placement. On his past two visits into the clinic, he was agitated and not able to cooperate for examination or treatment. His wife is bringing him in today with concern for UTI and skin infection. His wife provided history for todays visit, he is mostly non-verbal.  Foul urine- This is a new problem. He is incontinent, which is not new. His Incontinence is almost daily now.  He will still occasionally remember to use toilet. For the past several days his urine has had strong, foul smell He Has acted uncomfortable when urinating and grabbed his groin for the past several days. His urine appeared to have blood in it last week but this has not been noted this week. He's not had fevers, vomiting. He has had normal bowel movements. He has been Eating and drinking well.  Skin problem- This is an acute problem. This problem began about one week ago. The problem is a reddened, warm, swollen area to his left anterior ankle.  He has past history of eczema to this area, and his wife has tried applying topical steroid cream but the symptoms are getting worse. He has been treated recently with keflex and doxycycline for skin boils. He has been scratching the area frequently.  Review of Systems  See HPI  Past Medical History:  Diagnosis Date  . Alzheimer's disease   . BPH (benign prostatic hypertrophy)   . Dementia   . Depression   . Essential hypertension, benign   . Reflux      Social History   Socioeconomic History  . Marital status: Married    Spouse name: Not on  file  . Number of children: 2  . Years of education: 36  . Highest education level: Not on file  Social Needs  . Financial resource strain: Not on file  . Food insecurity - worry: Not on file  . Food insecurity - inability: Not on file  . Transportation needs - medical: Not on file  . Transportation needs - non-medical: Not on file  Occupational History  . Occupation: Retired  Tobacco Use  . Smoking status: Former Smoker    Packs/day: 0.50    Types: Cigarettes    Last attempt to quit: 10/09/2014    Years since quitting: 3.1  . Smokeless tobacco: Never Used  Substance and Sexual Activity  . Alcohol use: No  . Drug use: No  . Sexual activity: Not on file  Other Topics Concern  . Not on file  Social History Narrative   Fun: Likes to stay at home.   Denies any religious beliefs effecting health care.     Past Surgical History:  Procedure Laterality Date  . HIP ARTHROPLASTY Right 10/09/2014   Procedure: Right Hemiarthroplasty;  Surgeon: Rozanna Box, MD;  Location: [facility];  Service: Orthopedics;  Laterality: Right;  . TRANSURETHRAL RESECTION OF PROSTATE      Family History  Problem Relation Age of Onset  . Heart disease Brother   . Heart attack Father     Allergies  Allergen Reactions  . Niacin And Related Other (See Comments)    Turned red all over  . Terazosin     From [facility]    Current Outpatient Medications on File Prior to Visit  Medication Sig Dispense Refill  . ALPRAZolam (XANAX) 0.5 MG tablet Take 1 tablet (0.5 mg total) as needed by mouth for anxiety (agitation). 30 tablet 0  . cephALEXin (KEFLEX) 500 MG capsule Take 1 capsule (500 mg total) by mouth 3 (three) times daily. 21 capsule 0  . divalproex (DEPAKOTE ER) 250 MG 24 hr tablet Take 250 mg by mouth daily.     Marland Kitchen donepezil (ARICEPT) 10 MG tablet Take 10 mg by mouth at bedtime.     . finasteride (PROSCAR) 5 MG tablet TAKE 1 TABLET BY MOUTH DAILY 30 tablet 5  . hydrOXYzine (ATARAX/VISTARIL) 10 MG tablet  Take 10 mg by mouth 2 (two) times daily.     . memantine (NAMENDA) 10 MG tablet Take 10 mg by mouth daily.    . polyethylene glycol (MIRALAX / GLYCOLAX) packet Take 17 g by mouth daily. 30 each 0  . QUEtiapine Fumarate (SEROQUEL PO) Take 25 mg by mouth at bedtime.     . sertraline (ZOLOFT) 50 MG tablet Take 50 mg by mouth daily.    . traZODone (DESYREL) 50 MG tablet Take 50 mg by mouth at bedtime.     No current facility-administered medications on file prior to visit.     BP 120/80   Pulse 87   Resp 18   Ht 5\' 11"  (1.803 m)   SpO2 92%   BMI 22.45 kg/m      Objective:   Physical Exam  Constitutional: He is oriented to person, place, and time. He appears well-developed and well-nourished.  HENT:  Head: Normocephalic and atraumatic.  Cardiovascular: Normal rate, regular rhythm and intact distal pulses.  Pulmonary/Chest: Effort normal.  Neurological: He is alert and oriented to person, place, and time.  Skin: Skin is warm and dry.     Scaly, thickened, irritated skin to left lateral malleolus with overlying reddened, swollen, warm skin.  Psychiatric: He has a normal mood and affect. Judgment and thought content normal.      Assessment & Plan:  Mr. Christopher Summers did allow me to listen to his heart and lungs, and look at his leg today. He was not able to provide a urine sample or undress for further examination.  Cellulitis of left lower extremity PE consistent with cellulitis, possibly from patient continually scratching inflamed eczema patches.  Medications ordered: - sulfamethoxazole-trimethoprim (BACTRIM,SEPTRA) 400-80 MG tablet; Take 1 tablet by mouth 2 (two) times daily for 10 days.  Dispense: 20 tablet; Refill: 0 Return precautions given.  Abnormal urine odor Diagnostic testing ordered: - Urinalysis; Future - Urine Culture; Future Medications ordered: - sulfamethoxazole-trimethoprim (BACTRIM,SEPTRA) 400-80 MG tablet; Take 1 tablet by mouth 2 (two) times daily for 10 days.   Dispense: 20 tablet; Refill: 0 Return precautions given. Wife was given urine collection supplies, she will return urine sample to lab for UA if she is able to collect sample. She will also notify me if she notices any rash to patients groin, so that we can consider treatment for yeast infection

## 2017-11-29 NOTE — Patient Instructions (Addendum)
I have sent a prescription for bactrim. Please given 1 pill twice daily for 10 days.  Please follow up for high fevers, nausea, vomiting, redness that increases in size or begins to streak up his leg, if his symptoms do not improve by the end of this week, or if symptoms worsen.  If you notice a rash or redness to his groin, please let me know, he may have a yeast infection.  If you are able to catch a urine sample, you can bring it back to the lab and we will check for bacteria.

## 2017-11-29 NOTE — Patient Outreach (Signed)
[place] [facility]) Care Management  11/29/2017  Christopher Summers 12-28-1940 [id number]   A return call was received from patient's wife, Gerod Caligiuri today, in response to the HIPAA compliant message left for Mrs. Mabey on voicemail last week by CSW. Mrs. Nodarse was planning to go by patient's Primary Care Physician, Dr. Willene Hatchet office today to pick up the original FL-2 Form to take to the [facility] to apply for [facility] for patient.  CSW went through the checklist of all the items that Mrs. Treece will need to take with her when she goes to apply.  CSW encouraged Mrs. Horger to contact CSW directly, once she gets to the Department of Social Services, if she has questions or needs further assistance with the application process.  Otherwise, CSW will make arrangements to follow-up with Mrs. Haugen next week to ensure that the application was accepted and being processed for approval/denial.  Mrs. Hewes voiced understanding and was agreeable to this plan. THN CM Care Plan Problem One     Most Recent Value  Care Plan Problem One  Level of care issues.  Role Documenting the Problem One  Clinical Social Worker  Care Plan for Problem One  Active  [facility] Long Term Goal   Patient will receive placement in a long-term care facility within the next 45 days.  THN Long Term Goal Start Date  10/25/17  Interventions for Problem One Long Term Goal  CSW will assist patient's wife with arranging long-term care placement arrangements for patient.  THN CM Short Term Goal #1   CSW will contact patient's Nurse Practitioner to request a completed and signed FL-2 Form for patient, within the next week.  THN CM Short Term Goal #1 Start Date  10/25/17  [facility] CM Short Term Goal #1 Met Date  11/02/17  Interventions for Short Term Goal #1  CSW will pick up the completed and signed FL-2 Form from patient's nurse practitioner once  completed.  THN CM Short Term Goal #2   CSW will fax patient's FL-2 Form to all long-term care facilities of choice, within the next week.  THN CM Short Term Goal #2 Start Date  10/25/17  [facility] CM Short Term Goal #2 Met Date  11/16/17  Interventions for Short Term Goal #2  Patient's wife will provide CSW with a list of long-term care facilities of interest.  THN CM Short Term Goal #3  Patient's wife will apply for Long-Term Care Medicaid at the [place], within the next two weeks.  THN CM Short Term Goal #3 Start Date  10/25/17  [facility] CM Short Term Goal #3 Met Date  11/29/17  Interventions for Short Tern Goal #3  Patient's wife has been given an explicit set of instructions on how to apply for Long-Term Care Medicaid.  THN CM Short Term Goal #4  Bed offers will be given to patient's wife, once received, within the next two weeks.  THN CM Short Term Goal #4 Start Date  10/25/17  Interventions for Short Term Goal #4  Patient's wife will make arrangements to go and view list of facilities that made bef offers.    [name], [name], [name], [name]  Licensed Education officer, environmental Health System  Mailing [place] [address], [place], [place] [address] Physical Address-[address], [place], [place] [address] Toll Free Main # [phone] Fax # [phone] Cell # [phone]  Office # [phone] [email or web address]

## 2017-11-30 ENCOUNTER — Other Ambulatory Visit (INDEPENDENT_AMBULATORY_CARE_PROVIDER_SITE_OTHER): Payer: Medicare Other

## 2017-11-30 DIAGNOSIS — R8290 Unspecified abnormal findings in urine: Secondary | ICD-10-CM

## 2017-11-30 LAB — URINALYSIS, ROUTINE W REFLEX MICROSCOPIC
Bilirubin Urine: NEGATIVE
KETONES UR: NEGATIVE
LEUKOCYTES UA: NEGATIVE
Nitrite: NEGATIVE
PH: 6 (ref 5.0–8.0)
Specific Gravity, Urine: 1.025 (ref 1.000–1.030)
URINE GLUCOSE: NEGATIVE
UROBILINOGEN UA: 0.2 (ref 0.0–1.0)

## 2017-12-01 LAB — URINE CULTURE
MICRO NUMBER: 81421613
SPECIMEN QUALITY: ADEQUATE

## 2017-12-01 NOTE — Telephone Encounter (Signed)
The patient's wife called stating that the fax was not received by [facility] until 2 weeks after it was completed and then it was emailed over to her so that so could take it around to different facilities. After that, it snowed and she was not able to get out and then her husband was sick so she was not able to leave so at this point with the original date of 11/02/2017, the form would expire tomorrow. She wanted to know if another form could be completed with an updated date to be able to give her a full 30 days to find a facility with the FL2 Form.  She would like a call back when this is done or to let her know the status.

## 2017-12-02 NOTE — Telephone Encounter (Signed)
Date on FL2 form is now starting on 12/02/17.

## 2017-12-02 NOTE — Telephone Encounter (Signed)
LVM letting pts wife know that updated FL2 is ready for pick up at the front desk.

## 2017-12-03 ENCOUNTER — Ambulatory Visit: Payer: Self-pay | Admitting: *Deleted

## 2017-12-09 ENCOUNTER — Other Ambulatory Visit: Payer: Self-pay | Admitting: Nurse Practitioner

## 2017-12-09 ENCOUNTER — Encounter: Payer: Self-pay | Admitting: *Deleted

## 2017-12-09 ENCOUNTER — Other Ambulatory Visit: Payer: Self-pay | Admitting: *Deleted

## 2017-12-09 DIAGNOSIS — G309 Alzheimer's disease, unspecified: Principal | ICD-10-CM

## 2017-12-09 DIAGNOSIS — F0281 Dementia in other diseases classified elsewhere with behavioral disturbance: Secondary | ICD-10-CM

## 2017-12-09 NOTE — Patient Outreach (Signed)
[facility]) Care Management  12/09/2017  Christopher Summers 07-07-1941 [id number]   CSW was able to make contact with patient's wife, Abed Schar today to follow-up regarding long-term care placement arrangements for patient. Mrs. Berns reported that she has been able to secure a male bed for patient at [facility], [place].  [facility] offers memory care services and the wander-guard system to accommodate patient's memory impaired needs.  Mrs. Labrador was planning to go and tour the facility today, drop off the FL-2 Form and discuss finances with the business office.  Mrs. Weatherly was then planning to go to the [place] (DSS) to drop off the FL-2 Form to apply for Long-Term Care Medicaid for patient.  Mrs. Hasten has already spoken with a representative with DSS to learn that patient will more than likely be eligible for financial assistance once he has actually been placed at the assisted living facility.  Mrs. Saiki was most appreciative of all of CSW's assistance with the placement process. CSW will perform a case closure on patient, as all goals of treatment have been met from social work standpoint and no additional social work needs have been identified at this time.  CSW will fax an update to patient's Primary Care Physician, Dr. Caesar Chestnut to ensure that they are aware of CSW's involvement with patient's plan of care.  CSW will submit a case closure request to Alycia Rossetti, Care Management Assistant with [place] [facility], in the form of an [employer].   THN CM Care Plan Problem One     Most Recent Value  Care Plan Problem One  Level of care issues.  Role Documenting the Problem One  Clinical Social Worker  Care Plan for Problem One  Active  [facility] Long Term Goal   Patient will receive placement in a long-term care facility within the next 45 days.  [facility] Long Term  Goal Start Date  10/25/17  [facility] Long Term Goal Met Date  12/09/17  Interventions for Problem One Long Term Goal  CSW will assist patient's wife with arranging long-term care placement arrangements for patient.  [facility] CM Short Term Goal #1   CSW will contact patient's Nurse Practitioner to request a completed and signed FL-2 Form for patient, within the next week.  [facility]N CM Short Term Goal #1 Start Date  10/25/17  [facility] CM Short Term Goal #1 Met Date  11/02/17  Interventions for Short Term Goal #1  CSW will pick up the completed and signed FL-2 Form from patient's nurse practitioner once completed.  THN CM Short Term Goal #2   CSW will fax patient's FL-2 Form to all long-term care facilities of choice, within the next week.  [facility]N CM Short Term Goal #2 Start Date  10/25/17  [facility] CM Short Term Goal #2 Met Date  11/16/17  Interventions for Short Term Goal #2  Patient's wife will provide CSW with a list of long-term care facilities of interest.  THN CM Short Term Goal #3  Patient's wife will apply for Long-Term Care Medicaid at the [place], within the next two weeks.  THN CM Short Term Goal #3 Start Date  10/25/17  [facility] CM Short Term Goal #3 Met Date  11/29/17  Interventions for Short Tern Goal #3  Patient's wife has been given an explicit set of instructions on how to apply for Long-Term Care Medicaid.  [facility]N CM Short Term  Goal #4  Bed offers will be given to patient's wife, once received, within the next two weeks.  [facility]N CM Short Term Goal #4 Start Date  10/25/17  [facility] CM Short Term Goal #4 Met Date  12/09/17  Interventions for Short Term Goal #4  Patient's wife will make arrangements to go and view list of facilities that made bef offers.     [name], [name], [name], [name]  Licensed Education officer, [facility]  Mailing [place] [address] [place], [place] [address] Physical Address-[address],  [place],  [address] Toll Free Main # [phone] Fax # [phone] Cell # [phone]  Office # [phone] [email or web address]

## 2017-12-09 NOTE — Progress Notes (Signed)
Orders only

## 2017-12-17 ENCOUNTER — Telehealth: Payer: Self-pay

## 2017-12-17 ENCOUNTER — Other Ambulatory Visit: Payer: Self-pay

## 2017-12-17 DIAGNOSIS — F0281 Dementia in other diseases classified elsewhere with behavioral disturbance: Secondary | ICD-10-CM

## 2017-12-17 DIAGNOSIS — G309 Alzheimer's disease, unspecified: Principal | ICD-10-CM

## 2017-12-17 NOTE — Telephone Encounter (Signed)
Referral for hospice placed. Called hospice and gave pts information and let them know.

## 2017-12-22 NOTE — Telephone Encounter (Signed)
Copied from [place] [id number]. Topic: Inquiry >> Dec 22, 2017  1:52 PM Arletha Grippe wrote: Reason for ZXY:DSWV called - she is trying to place pt into skilled nursing facility - with dementia unit . She needs history of physical condition form for pt.  Wife will pick up form. Cb# [phone]  Please call wife when ready to pick. This is urgent so wife can get husband placed

## 2017-12-22 NOTE — Telephone Encounter (Signed)
Yes will be glad to see him for OV for leg pain. Of course we can only do as much as he will allow, but I can try to take a look at his leg and see if we can find out why it is hurting. We can schedule an appointment that is convenient for her

## 2017-12-22 NOTE — Telephone Encounter (Signed)
Spoke with wife and advised that I was unsure of a physical condition form and advised we do not have one in our office. Wife gave me a number to Michelle Piper the Mudlogger of Admissions to the [place] facility. I called and LVM for Levada Dy to call me back directly to get information that is needed for patient so he can get admitted into facility.

## 2017-12-22 NOTE — Telephone Encounter (Signed)
Pts wife also wanted me to let you know that pt is complaining about his leg hurting and wondered if she should bring him in for an OV and get an Xray. Please advise.

## 2017-12-23 ENCOUNTER — Encounter: Payer: Self-pay | Admitting: Nurse Practitioner

## 2017-12-23 ENCOUNTER — Ambulatory Visit (INDEPENDENT_AMBULATORY_CARE_PROVIDER_SITE_OTHER): Payer: Medicare Other | Admitting: Nurse Practitioner

## 2017-12-23 VITALS — BP 122/80 | Resp 16 | Ht 71.0 in | Wt 160.0 lb

## 2017-12-23 DIAGNOSIS — M79604 Pain in right leg: Secondary | ICD-10-CM

## 2017-12-23 MED ORDER — ACETAMINOPHEN 160 MG/5ML PO LIQD: 500.0000 mg | ORAL | 0 refills | Status: AC | PRN

## 2017-12-23 NOTE — Patient Instructions (Addendum)
For leg pain, you may take tylenol 500 mg liquid every 6 hours for pain.  If your leg pain gets worse, please let me know.  When you are able to get paperwork for the skilled living facility, bring it straight to [place] to complete - this may help the process move more quickly

## 2017-12-23 NOTE — Progress Notes (Signed)
Subjective:    Patient ID: Christopher Summers, male    DOB: November 30, 1941, 77 y.o.   MRN: [id number]  HPI Christopher Summers is a 77 yo male who presents today for an acute visit. He has advanced alzheimer's dementia and comes in today with his wife, who is his primary caregiver. His wife has been having increasing difficulty providing care for the patient at home and is currently working with social work for long term care placement. She has received notice of acceptance from 2 different facilities and is awaiting an open bed. On his past two visits into the clinic, he was agitated and not able to cooperate for examination or treatment. His wife is providing the history today, as he is not verbal.    His wife is bringing him in today for leg pain. She began to notice he was limping, not wanting bear weight on the right leg, about one week ago. The limping has been intermittent, At times he seems to walk normally. She does say he has had a few minor falls at home over the past several weeks. She has not noticed any wounds, bruises, discoloration, swelling or deformity. He has not seemed weaker than normal. His behavior has remained at baseline this week, and does not seem to have changed.  Review of Systems  See HPI  Past Medical History:  Diagnosis Date  . Alzheimer's disease   . BPH (benign prostatic hypertrophy)   . Dementia   . Depression   . Essential hypertension, benign   . Reflux      Social History   Socioeconomic History  . Marital status: Married    Spouse name: Not on file  . Number of children: 2  . Years of education: 33  . Highest education level: Not on file  Social Needs  . Financial resource strain: Not on file  . Food insecurity - worry: Not on file  . Food insecurity - inability: Not on file  . Transportation needs - medical: Not on file  . Transportation needs - non-medical: Not on file  Occupational History  . Occupation: Retired  Tobacco Use  . Smoking status:  Former Smoker    Packs/day: 0.50    Types: Cigarettes    Last attempt to quit: 10/09/2014    Years since quitting: 3.2  . Smokeless tobacco: Never Used  Substance and Sexual Activity  . Alcohol use: No  . Drug use: No  . Sexual activity: Not on file  Other Topics Concern  . Not on file  Social History Narrative   Fun: Likes to stay at home.   Denies any religious beliefs effecting health care.     Past Surgical History:  Procedure Laterality Date  . HIP ARTHROPLASTY Right 10/09/2014   Procedure: Right Hemiarthroplasty;  Surgeon: Rozanna Box, MD;  Location: [facility];  Service: Orthopedics;  Laterality: Right;  . TRANSURETHRAL RESECTION OF PROSTATE      Family History  Problem Relation Age of Onset  . Heart disease Brother   . Heart attack Father     Allergies  Allergen Reactions  . Niacin And Related Other (See Comments)    Turned red all over  . Terazosin     From [facility]    Current Outpatient Medications on File Prior to Visit  Medication Sig Dispense Refill  . ALPRAZolam (XANAX) 0.5 MG tablet Take 1 tablet (0.5 mg total) as needed by mouth for anxiety (agitation). 30 tablet 0  . cephALEXin (KEFLEX)  500 MG capsule Take 1 capsule (500 mg total) by mouth 3 (three) times daily. 21 capsule 0  . divalproex (DEPAKOTE ER) 250 MG 24 hr tablet Take 250 mg by mouth daily.     Marland Kitchen donepezil (ARICEPT) 10 MG tablet Take 10 mg by mouth at bedtime.     . finasteride (PROSCAR) 5 MG tablet TAKE 1 TABLET BY MOUTH DAILY 30 tablet 5  . hydrOXYzine (ATARAX/VISTARIL) 10 MG tablet Take 10 mg by mouth 2 (two) times daily.     . memantine (NAMENDA) 10 MG tablet Take 10 mg by mouth daily.    . polyethylene glycol (MIRALAX / GLYCOLAX) packet Take 17 g by mouth daily. 30 each 0  . QUEtiapine Fumarate (SEROQUEL PO) Take 25 mg by mouth at bedtime.     . sertraline (ZOLOFT) 50 MG tablet Take 50 mg by mouth daily.    . traZODone (DESYREL) 50 MG tablet Take 50 mg by mouth at bedtime.     No current  facility-administered medications on file prior to visit.     BP 122/80 (BP Location: Right Arm, Patient Position: Sitting, Cuff Size: Large)   Resp 16   Ht 5\' 11"  (1.803 m)   Wt 160 lb (72.6 kg)   BMI 22.32 kg/m        Objective:   Physical Exam  Constitutional: He appears well-developed and well-nourished.  HENT:  Head: Normocephalic.  Neck: Normal range of motion. No tracheal deviation present.  Cardiovascular: Normal rate and intact distal pulses.  Pulmonary/Chest: Effort normal. No respiratory distress.  Musculoskeletal:       Right hip: He exhibits no tenderness, no swelling and no deformity.       Right knee: He exhibits no swelling and no deformity. No tenderness found.       Right ankle: He exhibits no swelling and no deformity. No tenderness.  Neurological: He is alert.  Upper extremity grips equal bilaterally.  Skin: Skin is warm and dry. No bruising and no laceration noted.  Psychiatric: Cognition and memory are impaired.  Mildly agitated. Not cooperative.  Vitals reviewed.      Assessment & Plan:    Pain of right lower extremity Mr Townley remains uncooperative today, standing during entire exam and refusing to allow full examination  He did allow me to palpate his leg while standing- palpation of his entire leg from hip to foot did not elicit pain response. He did also allow his wife to pull his pants to his ankles and there are no visible deformities, wounds, or bruises to his leg.  He is ambulating independently, but does avoid bearing weight onto his right foot intermittently in the office today. We discussed the possible causes of his  altered gait including a muscle strain, fracture, or maybe even a stroke. We also discussed the limitation on further diagnostic testing to evaluate cause of his leg pain and his wife agrees that he would not likely cooperate for testing. We will treat with tylenol prn and wife will notify me if his symptoms worsen.

## 2017-12-23 NOTE — Telephone Encounter (Signed)
Spoke with wife and scheduled an appointment for patient to come in today to have leg checked.

## 2017-12-26 ENCOUNTER — Encounter: Payer: Self-pay | Admitting: Nurse Practitioner

## 2017-12-29 ENCOUNTER — Other Ambulatory Visit: Payer: Self-pay

## 2018-01-05 ENCOUNTER — Telehealth: Payer: Self-pay

## 2018-01-05 NOTE — Telephone Encounter (Signed)
Pts wife called me on my direct number and stated that pts leg is bothering him to the point of him refusing to walk. She states that she did not take him to daycare yesterday because he refused to get up. She is asking for something that can be prescribed a little stronger than OTC pain medications. Please advise.

## 2018-01-06 NOTE — Telephone Encounter (Signed)
I dont think we should give him anything stronger because I don't want him to fall. She could try to alternate tylenol and ibuprofen. Also can we try to find out where the referral to palliative care is at? I think palliative care could really help.

## 2018-01-07 ENCOUNTER — Emergency Department (HOSPITAL_COMMUNITY): Payer: Medicare Other

## 2018-01-07 ENCOUNTER — Emergency Department (HOSPITAL_COMMUNITY)
Admission: EM | Admit: 2018-01-07 | Discharge: 2018-01-07 | Disposition: A | Discharge status: Other Acute Inpatient Hospital | Payer: Medicare Other | Attending: Emergency Medicine | Admitting: Emergency Medicine

## 2018-01-07 DIAGNOSIS — F0280 Dementia in other diseases classified elsewhere without behavioral disturbance: Secondary | ICD-10-CM | POA: Diagnosis not present

## 2018-01-07 DIAGNOSIS — M6281 Muscle weakness (generalized): Secondary | ICD-10-CM | POA: Diagnosis not present

## 2018-01-07 DIAGNOSIS — Z87891 Personal history of nicotine dependence: Secondary | ICD-10-CM | POA: Insufficient documentation

## 2018-01-07 DIAGNOSIS — N329 Bladder disorder, unspecified: Secondary | ICD-10-CM | POA: Diagnosis not present

## 2018-01-07 DIAGNOSIS — Z96641 Presence of right artificial hip joint: Secondary | ICD-10-CM | POA: Diagnosis not present

## 2018-01-07 DIAGNOSIS — Z79899 Other long term (current) drug therapy: Secondary | ICD-10-CM | POA: Insufficient documentation

## 2018-01-07 DIAGNOSIS — W19XXXA Unspecified fall, initial encounter: Secondary | ICD-10-CM | POA: Diagnosis not present

## 2018-01-07 DIAGNOSIS — R2689 Other abnormalities of gait and mobility: Secondary | ICD-10-CM | POA: Diagnosis not present

## 2018-01-07 DIAGNOSIS — G309 Alzheimer's disease, unspecified: Secondary | ICD-10-CM | POA: Diagnosis not present

## 2018-01-07 DIAGNOSIS — I10 Essential (primary) hypertension: Secondary | ICD-10-CM | POA: Insufficient documentation

## 2018-01-07 DIAGNOSIS — G300 Alzheimer's disease with early onset: Secondary | ICD-10-CM | POA: Diagnosis not present

## 2018-01-07 DIAGNOSIS — E860 Dehydration: Secondary | ICD-10-CM | POA: Diagnosis not present

## 2018-01-07 DIAGNOSIS — R4182 Altered mental status, unspecified: Secondary | ICD-10-CM | POA: Insufficient documentation

## 2018-01-07 DIAGNOSIS — R319 Hematuria, unspecified: Secondary | ICD-10-CM | POA: Diagnosis not present

## 2018-01-07 DIAGNOSIS — R410 Disorientation, unspecified: Secondary | ICD-10-CM | POA: Diagnosis not present

## 2018-01-07 DIAGNOSIS — Z471 Aftercare following joint replacement surgery: Secondary | ICD-10-CM | POA: Diagnosis not present

## 2018-01-07 DIAGNOSIS — G308 Other Alzheimer's disease: Secondary | ICD-10-CM | POA: Diagnosis not present

## 2018-01-07 DIAGNOSIS — R05 Cough: Secondary | ICD-10-CM | POA: Diagnosis not present

## 2018-01-07 LAB — COMPREHENSIVE METABOLIC PANEL
ALBUMIN: 3.8 g/dL (ref 3.5–5.0)
ALT: 15 U/L — AB (ref 17–63)
AST: 27 U/L (ref 15–41)
Alkaline Phosphatase: 55 U/L (ref 38–126)
Anion gap: 10 (ref 5–15)
BUN: 14 mg/dL (ref 6–20)
CHLORIDE: 104 mmol/L (ref 101–111)
CO2: 26 mmol/L (ref 22–32)
CREATININE: 1.08 mg/dL (ref 0.61–1.24)
Calcium: 9.6 mg/dL (ref 8.9–10.3)
GFR calc Af Amer: >60 mL/min (ref >60)
GFR calc non Af Amer: >60 mL/min (ref >60)
GLUCOSE: 70 mg/dL (ref 65–99)
POTASSIUM: 4.2 mmol/L (ref 3.5–5.1)
Sodium: 140 mmol/L (ref 135–145)
Total Bilirubin: 0.7 mg/dL (ref 0.3–1.2)
Total Protein: 7.2 g/dL (ref 6.5–8.1)

## 2018-01-07 LAB — CBC WITH DIFFERENTIAL/PLATELET
BASOS ABS: 0 10*3/uL (ref 0.0–0.1)
Basophils Relative: 0 %
Eosinophils Absolute: 0.1 10*3/uL (ref 0.0–0.7)
Eosinophils Relative: 1 %
HEMATOCRIT: 39.6 % (ref 39.0–52.0)
HEMOGLOBIN: 12.4 g/dL — AB (ref 13.0–17.0)
LYMPHS PCT: 31 %
Lymphs Abs: 2.2 10*3/uL (ref 0.7–4.0)
MCH: 28.4 pg (ref 26.0–34.0)
MCHC: 31.3 g/dL (ref 30.0–36.0)
MCV: 90.6 fL (ref 78.0–100.0)
Monocytes Absolute: 0.5 10*3/uL (ref 0.1–1.0)
Monocytes Relative: 7 %
NEUTROS ABS: 4.1 10*3/uL (ref 1.7–7.7)
NEUTROS PCT: 61 %
PLATELETS: 243 10*3/uL (ref 150–400)
RBC: 4.37 MIL/uL (ref 4.22–5.81)
RDW: 14 % (ref 11.5–15.5)
WBC: 6.9 10*3/uL (ref 4.0–10.5)

## 2018-01-07 LAB — URINALYSIS, ROUTINE W REFLEX MICROSCOPIC
BILIRUBIN URINE: NEGATIVE
Glucose, UA: NEGATIVE mg/dL
Ketones, ur: NEGATIVE mg/dL
LEUKOCYTES UA: NEGATIVE
NITRITE: NEGATIVE
PH: 5 (ref 5.0–8.0)
Protein, ur: 30 mg/dL — AB
SPECIFIC GRAVITY, URINE: 1.024 (ref 1.005–1.030)

## 2018-01-07 LAB — I-STAT TROPONIN, ED: Troponin i, poc: 0 ng/mL (ref 0.00–0.08)

## 2018-01-07 LAB — VALPROIC ACID LEVEL

## 2018-01-07 MED ORDER — SODIUM CHLORIDE 0.9 % IV BOLUS (SEPSIS)
1000.0000 mL | Freq: Once | INTRAVENOUS | Status: AC
  Administered 2018-01-07: 1000 mL via INTRAVENOUS

## 2018-01-07 NOTE — Progress Notes (Addendum)
2:55pm: CSW made aware that pt is able to discharge to [facility] at this time. CSW has arranged transport by [facility] and has faxed over all needed information at this time. At this time there are no further CSW needs. CSW signing off.   CSW made aware that pt may not be ready for transport to [facility] until after 3pm today. CSW has spoken with Santiago Glad from [facility] as well as family at bedside to update them of this at this time. CSW will verbally handoff to evening CSW as needed. CSW continues to follow for current needs.     Virgie Dad Anedra Penafiel, [name], [facility] Clinical Social Worker [phone]

## 2018-01-07 NOTE — ED Triage Notes (Addendum)
Pt w/ hx of dementia lives at home w/ wife. Per EMS, wife states that pt was more confused normal this AM and had difficulty following commands. Pt arrives, alert, disoriented. Wife also states that urine has been dark and discolored lately

## 2018-01-07 NOTE — Telephone Encounter (Signed)
Called wife and she stated she was currently on the phone with 911. She had to call because she could not get pt to get up. I told her to call me back when she could.

## 2018-01-07 NOTE — Discharge Planning (Signed)
Clinical Social Work is seeking post-discharge placement for this patient at the following level of care: Skilled Nursing Facility.    

## 2018-01-07 NOTE — Discharge Instructions (Addendum)
You have been evaluated for your increase confusion.  No identifiable cause for hospital admission.   You will benefit for placement to a skill facility.

## 2018-01-07 NOTE — ED Provider Notes (Signed)
[facility] EMERGENCY DEPARTMENT Provider Note   CSN: [id number] Arrival date & time: 01/07/18  1040     History   Chief Complaint No chief complaint on file.   HPI Christopher Summers is a 77 y.o. male.  HPI   77 year old male with history of Alzheimer disease, depression, hypertension brought here via EMS from home for evaluation of weakness and confusion.  History obtained through wife who is at bedside.  Per wife, for the past 3-4 weeks patient has dementia and confusion has worsened.  It is difficult to communicate with him more than usual.  He is less active.  He did had cold symptoms for several days last week which has improved.  Wife did reach out to PCP who offered an FL2 to help find placement for this patient.  Furthermore for the past week is more difficult to the patient to move about.  The patient has a right hip replacement in 2015.  Wife states patient appears to have some pain to his right hip causing him to have trouble moving about.  She noticed that within the past week.  She normally will take patient to daycare but she is having difficulty getting him up to get in and out of the car because of his hip pain.  She also noticed that his urine is darker than usual for the past week.  She brought patient here for further evaluation of his condition.  Given his dementia, limited history was able to obtain from the patient himself.  Past Medical History:  Diagnosis Date  . Alzheimer's disease   . BPH (benign prostatic hypertrophy)   . Dementia   . Depression   . Essential hypertension, benign   . Reflux     Patient Active Problem List   Diagnosis Date Noted  . Boil 06/24/2017  . Abscess of leg 05/01/2017  . Right leg pain 10/19/2016  . Left leg pain 06/30/2016  . Abscess of axilla, left 06/15/2016  . Anxiety state 10/26/2014  . 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 Acute blood loss anemia 10/15/2014  . Hip fracture, right ([place]) 10/08/2014    . Alzheimer's dementia with behavioral disturbance 10/08/2014  . Preoperative evaluation 10/08/2014  . Major depressive disorder, recurrent episode ([place]) 10/05/2007  . HYPERTENSION, BENIGN 10/05/2007  . CYSTITIS, ACUTE 10/05/2007  . BPH (benign prostatic hyperplasia) 10/05/2007    Past Surgical History:  Procedure Laterality Date  . HIP ARTHROPLASTY Right 10/09/2014   Procedure: Right Hemiarthroplasty;  Surgeon: Rozanna Box, MD;  Location: [facility];  Service: Orthopedics;  Laterality: Right;  . TRANSURETHRAL RESECTION OF PROSTATE         Home Medications    Prior to Admission medications   Medication Sig Start Date End Date Taking? Authorizing Provider  acetaminophen (TYLENOL) 160 MG/5ML liquid Take 15.6 mLs (500 mg total) by mouth every 4 (four) hours as needed for fever. 12/23/17   Lance Sell, NP  ALPRAZolam Duanne Moron) 0.5 MG tablet Take 1 tablet (0.5 mg total) as needed by mouth for anxiety (agitation). 10/18/17   Lance Sell, NP  divalproex (DEPAKOTE ER) 250 MG 24 hr tablet Take 250 mg by mouth daily.     [provider]  donepezil (ARICEPT) 10 MG tablet Take 10 mg by mouth at bedtime.     [provider]  finasteride (PROSCAR) 5 MG tablet TAKE 1 TABLET BY MOUTH DAILY 05/03/15   Golden Circle, FNP  hydrOXYzine (ATARAX/VISTARIL) 10 MG  tablet Take 10 mg by mouth 2 (two) times daily.     [provider]  memantine (NAMENDA) 10 MG tablet Take 10 mg by mouth daily.    [provider]  polyethylene glycol (MIRALAX / GLYCOLAX) packet Take 17 g by mouth daily. 07/07/16   Wynona Luna, MD  QUEtiapine Fumarate (SEROQUEL PO) Take 25 mg by mouth at bedtime.     [provider]  sertraline (ZOLOFT) 50 MG tablet Take 50 mg by mouth daily.    [provider]  traZODone (DESYREL) 50 MG tablet Take 50 mg by mouth at bedtime.    [provider]    Family History Family History  Problem Relation Age of  Onset  . Heart disease Brother   . Heart attack Father     Social History Social History   Tobacco Use  . Smoking status: Former Smoker    Packs/day: 0.50    Types: Cigarettes    Last attempt to quit: 10/09/2014    Years since quitting: 3.2  . Smokeless tobacco: Never Used  Substance Use Topics  . Alcohol use: No  . Drug use: No     Allergies   Niacin and related and Terazosin   Review of Systems Review of Systems  Unable to perform ROS: Dementia     Physical Exam Updated Vital Signs There were no vitals taken for this visit.  Physical Exam  Constitutional: He appears well-developed and well-nourished. No distress.  Elderly male laying in bed in no acute discomfort  HENT:  Head: Atraumatic.  Eyes: Conjunctivae and EOM are normal. Pupils are equal, round, and reactive to light.  Neck: Normal range of motion. Neck supple.  Cardiovascular: Normal rate and regular rhythm.  Pulmonary/Chest: Effort normal and breath sounds normal.  Abdominal: Soft. He exhibits no distension. There is no tenderness.  Genitourinary:  Genitourinary Comments: Wearing adult diapers  Musculoskeletal: He exhibits tenderness (Right hip: Pain with hip flexion, no deformity, no short of leg).  Neurological: He is alert.  Patient alert, answers questions inappropriately and does not follow command.  Skin: No rash noted.  Psychiatric: He has a normal mood and affect.  Nursing note and vitals reviewed.    ED Treatments / Results  Labs (all labs ordered are listed, but only abnormal results are displayed) Labs Reviewed  COMPREHENSIVE METABOLIC PANEL - Abnormal; Notable for the following components:      Result Value   ALT 15 (*)    All other components within normal limits  CBC WITH DIFFERENTIAL/PLATELET - Abnormal; Notable for the following components:   Hemoglobin 12.4 (*)    All other components within normal limits  VALPROIC ACID LEVEL - Abnormal; Notable for the following  components:   Valproic Acid Lvl <10 (*)    All other components within normal limits  URINALYSIS, ROUTINE W REFLEX MICROSCOPIC - Abnormal; Notable for the following components:   APPearance HAZY (*)    Hgb urine dipstick LARGE (*)    Protein, ur 30 (*)    Bacteria, UA RARE (*)    Squamous Epithelial / LPF 0-5 (*)    All other components within normal limits  URINE CULTURE  I-STAT TROPONIN, ED  CBG MONITORING, ED    EKG  EKG Interpretation  Date/Time:  Friday January 07 2018 13:35:14 EST Ventricular Rate:  58 PR Interval:    QRS Duration: 106 QT Interval:  420 QTC Calculation: 413 R Axis:   0 Text Interpretation:  Sinus rhythm  Low voltage, precordial leads Abnormal R-wave progression, early transition No significant change since last tracing Confirmed by Wandra Arthurs ([id number]) on 01/07/2018 2:34:22 PM       Radiology Dg Chest 2 View  Result Date: 01/07/2018 CLINICAL DATA:  Cough.  History of dementia EXAM: CHEST  2 VIEW COMPARISON:  07/07/2016 FINDINGS: Borderline heart size accentuated by technique. Mildly low lung volumes with interstitial crowding. There is no edema, consolidation, effusion, or pneumothorax. IMPRESSION: Low volume chest without acute finding. Electronically Signed   By: Monte Fantasia M.D.   On: 01/07/2018 12:54   Ct Head Wo Contrast  Result Date: 01/07/2018 CLINICAL DATA:  History of dementia with increased confusion and difficulty following commands. EXAM: CT HEAD WITHOUT CONTRAST TECHNIQUE: Contiguous axial images were obtained from the base of the skull through the vertex without intravenous contrast. COMPARISON:  11/13/2016 FINDINGS: Brain: There is no evidence of acute infarct, intracranial hemorrhage, mass, midline shift, or extra-axial fluid collection. Advanced bilateral temporal lobe atrophy is again seen. Patchy to confluent subcortical and periventricular white matter hypodensities are unchanged and nonspecific but compatible with moderate chronic  small vessel ischemic disease. 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 Vascular: Calcified atherosclerosis at the skull base. No hyperdense vessel. Skull: No fracture or focal osseous lesion. Sinuses/Orbits: Sclerotic thickening of the right sphenoid sinus walls, unchanged and consistent with history of chronic sinusitis. Associated right sphenoid sinus mucosal thickening on the prior CT has nearly completely resolved. Clear mastoid air cells. Unremarkable orbits. Other: None. IMPRESSION: 1. No evidence of acute intracranial abnormality. 2. Unchanged chronic small vessel ischemic disease and advanced temporal lobe atrophy. Electronically Signed   By: Logan Bores M.D.   On: 01/07/2018 12:20   Dg Hip Unilat W Or Wo Pelvis 2-3 Views Right  Result Date: 01/07/2018 CLINICAL DATA:  Altered mental status beginning this morning. EXAM: DG HIP (WITH OR WITHOUT PELVIS) 2-3V RIGHT COMPARISON:  None. FINDINGS: No acute bony or joint abnormality is identified. Right hip arthroplasty is in place without complicating feature. 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 IMPRESSION: No acute abnormality. Left hip osteoarthritis. Right hip replacement without complicating feature. Electronically Signed   By: Inge Rise M.D.   On: 01/07/2018 12:55    Procedures Procedures (including critical care time)  Medications Ordered in ED Medications  sodium chloride 0.9 % bolus 1,000 mL (1,000 mLs Intravenous Transfusing/Transfer 01/07/18 1340)     Initial Impression / Assessment and Plan / ED Course  I have reviewed the triage vital signs and the nursing notes.  Pertinent labs & imaging results that were available during my care of the patient were reviewed by me and considered in my medical decision making (see chart for details).     BP (!) 149/84   Pulse (!) 57   Temp (!) 97.5 F (36.4 C) (Axillary)   Resp 13   Ht 5\' 11"  (1.803 m)   Wt 72.6 kg (160 lb)   SpO2  100%   BMI 22.32 kg/m    Final Clinical Impressions(s) / ED Diagnoses   Final diagnoses:  Altered mental status, unspecified altered mental status type    ED Discharge Orders    None     11:20 AM Patient here due to progressive worsening of his dementia and also has a fall risk because he is having trouble standing..  Will perform screening exam to ensure he does not have any underlying infection causing his symptoms.  Will obtain x-ray of his right hip to ensure no occult fracture.  Will consult with case management for additional help. Care discussed with DR. Yao.    11:40 AM Appreciate the help of Case Management who was able to set up placement to [facility] for SNF care.   2:34 PM EKG without concerning changes, normal troponin, urine shows evidence of blood however this is likely from an in and out cath, no signs of urinary tract infection, electrolytes panels are reassuring, normal WBC and H&H.  His valproic acid level is subtherapeutic.View is still sitting on the same x-ray of his chest shows no acute pathology, right hip x-ray without acute fractures or dislocation, head CT scan show no evidence of acute intracranial abnormality.  At this time, patient is stable for discharge.  He will be transferred over to [facility] for outpatient management of his condition.   Domenic Moras, PA-C 01/07/18 1438    Drenda Freeze, MD 01/07/18 [phone]

## 2018-01-07 NOTE — ED Notes (Signed)
Pt to xray

## 2018-01-07 NOTE — ED Notes (Signed)
Pt to CT

## 2018-01-07 NOTE — Progress Notes (Signed)
CSW spoke with pt and wife at bedside. CSW was made aware that pt's information was sent to facilities a few weeks ago. Today wife chose [facility] for pt to go to. CSW spoke with Santiago Glad from [facility] and confirmed that they are able to take pt today after 2pm. At at this time CSW will await test results and then start process to getting pt to [facility]. CSW remains available for support at this time.     Virgie Dad Izan Miron, [name], [facility] Clinical Social Worker [phone]

## 2018-01-08 LAB — URINE CULTURE

## 2018-01-11 DIAGNOSIS — G300 Alzheimer's disease with early onset: Secondary | ICD-10-CM | POA: Diagnosis not present

## 2018-01-11 DIAGNOSIS — I10 Essential (primary) hypertension: Secondary | ICD-10-CM | POA: Diagnosis not present

## 2018-01-11 DIAGNOSIS — E860 Dehydration: Secondary | ICD-10-CM | POA: Diagnosis not present

## 2018-01-21 DIAGNOSIS — M6281 Muscle weakness (generalized): Secondary | ICD-10-CM | POA: Diagnosis not present

## 2018-01-21 DIAGNOSIS — W19XXXA Unspecified fall, initial encounter: Secondary | ICD-10-CM | POA: Diagnosis not present

## 2018-01-24 DIAGNOSIS — R2689 Other abnormalities of gait and mobility: Secondary | ICD-10-CM | POA: Diagnosis not present

## 2018-01-24 DIAGNOSIS — W19XXXA Unspecified fall, initial encounter: Secondary | ICD-10-CM | POA: Diagnosis not present

## 2018-01-24 DIAGNOSIS — M6281 Muscle weakness (generalized): Secondary | ICD-10-CM | POA: Diagnosis not present

## 2018-02-24 DIAGNOSIS — G309 Alzheimer's disease, unspecified: Secondary | ICD-10-CM | POA: Diagnosis not present

## 2018-02-24 DIAGNOSIS — I10 Essential (primary) hypertension: Secondary | ICD-10-CM | POA: Diagnosis not present

## 2018-11-09 ENCOUNTER — Other Ambulatory Visit: Payer: Self-pay

## 2018-11-09 ENCOUNTER — Emergency Department (HOSPITAL_COMMUNITY): Payer: Medicare Other

## 2018-11-09 ENCOUNTER — Encounter (HOSPITAL_COMMUNITY): Payer: Self-pay | Admitting: Emergency Medicine

## 2018-11-09 ENCOUNTER — Inpatient Hospital Stay (HOSPITAL_COMMUNITY)
Admission: EM | Admit: 2018-11-09 | Discharge: 2018-11-13 | DRG: 871 | Disposition: E | Payer: Medicare Other | Attending: Internal Medicine | Admitting: Internal Medicine

## 2018-11-09 DIAGNOSIS — Z87891 Personal history of nicotine dependence: Secondary | ICD-10-CM

## 2018-11-09 DIAGNOSIS — J13 Pneumonia due to Streptococcus pneumoniae: Secondary | ICD-10-CM | POA: Diagnosis not present

## 2018-11-09 DIAGNOSIS — I10 Essential (primary) hypertension: Secondary | ICD-10-CM | POA: Diagnosis present

## 2018-11-09 DIAGNOSIS — J9601 Acute respiratory failure with hypoxia: Secondary | ICD-10-CM | POA: Diagnosis present

## 2018-11-09 DIAGNOSIS — A419 Sepsis, unspecified organism: Secondary | ICD-10-CM | POA: Diagnosis present

## 2018-11-09 DIAGNOSIS — Z66 Do not resuscitate: Secondary | ICD-10-CM | POA: Diagnosis present

## 2018-11-09 DIAGNOSIS — G309 Alzheimer's disease, unspecified: Secondary | ICD-10-CM | POA: Diagnosis present

## 2018-11-09 DIAGNOSIS — G301 Alzheimer's disease with late onset: Secondary | ICD-10-CM

## 2018-11-09 DIAGNOSIS — Z515 Encounter for palliative care: Secondary | ICD-10-CM | POA: Diagnosis present

## 2018-11-09 DIAGNOSIS — Z993 Dependence on wheelchair: Secondary | ICD-10-CM | POA: Diagnosis not present

## 2018-11-09 DIAGNOSIS — J189 Pneumonia, unspecified organism: Secondary | ICD-10-CM

## 2018-11-09 DIAGNOSIS — N179 Acute kidney failure, unspecified: Secondary | ICD-10-CM

## 2018-11-09 DIAGNOSIS — Z7401 Bed confinement status: Secondary | ICD-10-CM | POA: Diagnosis not present

## 2018-11-09 DIAGNOSIS — Z7189 Other specified counseling: Secondary | ICD-10-CM | POA: Diagnosis not present

## 2018-11-09 DIAGNOSIS — F329 Major depressive disorder, single episode, unspecified: Secondary | ICD-10-CM | POA: Diagnosis present

## 2018-11-09 DIAGNOSIS — Z888 Allergy status to other drugs, medicaments and biological substances status: Secondary | ICD-10-CM | POA: Diagnosis not present

## 2018-11-09 DIAGNOSIS — Y95 Nosocomial condition: Secondary | ICD-10-CM | POA: Diagnosis present

## 2018-11-09 DIAGNOSIS — N400 Enlarged prostate without lower urinary tract symptoms: Secondary | ICD-10-CM | POA: Diagnosis present

## 2018-11-09 DIAGNOSIS — Z8249 Family history of ischemic heart disease and other diseases of the circulatory system: Secondary | ICD-10-CM | POA: Diagnosis not present

## 2018-11-09 DIAGNOSIS — F0280 Dementia in other diseases classified elsewhere without behavioral disturbance: Secondary | ICD-10-CM | POA: Diagnosis present

## 2018-11-09 DIAGNOSIS — Z96641 Presence of right artificial hip joint: Secondary | ICD-10-CM | POA: Diagnosis present

## 2018-11-09 DIAGNOSIS — R6520 Severe sepsis without septic shock: Secondary | ICD-10-CM | POA: Diagnosis present

## 2018-11-09 DIAGNOSIS — J690 Pneumonitis due to inhalation of food and vomit: Secondary | ICD-10-CM | POA: Diagnosis present

## 2018-11-09 DIAGNOSIS — F419 Anxiety disorder, unspecified: Secondary | ICD-10-CM | POA: Diagnosis present

## 2018-11-09 HISTORY — DX: Pneumonia, unspecified organism: J18.9

## 2018-11-09 HISTORY — DX: Acute embolism and thrombosis of unspecified deep veins of unspecified lower extremity: I82.409

## 2018-11-09 HISTORY — DX: Acute respiratory failure with hypoxia: J96.01

## 2018-11-09 HISTORY — DX: Gastro-esophageal reflux disease without esophagitis: K21.9

## 2018-11-09 HISTORY — DX: Dementia in other diseases classified elsewhere, unspecified severity, without behavioral disturbance, psychotic disturbance, mood disturbance, and anxiety: F02.80

## 2018-11-09 HISTORY — DX: Alzheimer's disease, unspecified: G30.9

## 2018-11-09 LAB — COMPREHENSIVE METABOLIC PANEL
ALBUMIN: 2.4 g/dL — AB (ref 3.5–5.0)
ALK PHOS: 94 U/L (ref 38–126)
ALT: 27 U/L (ref 0–44)
AST: 31 U/L (ref 15–41)
Anion gap: 17 — ABNORMAL HIGH (ref 5–15)
BILIRUBIN TOTAL: 0.4 mg/dL (ref 0.3–1.2)
BUN: 29 mg/dL — AB (ref 8–23)
CALCIUM: 9 mg/dL (ref 8.9–10.3)
CO2: 17 mmol/L — ABNORMAL LOW (ref 22–32)
CREATININE: 1.7 mg/dL — AB (ref 0.61–1.24)
Chloride: 111 mmol/L (ref 98–111)
GFR calc Af Amer: 45 mL/min — ABNORMAL LOW (ref >60)
GFR calc non Af Amer: 39 mL/min — ABNORMAL LOW (ref >60)
GLUCOSE: 181 mg/dL — AB (ref 70–99)
Potassium: 4 mmol/L (ref 3.5–5.1)
Sodium: 145 mmol/L (ref 135–145)
TOTAL PROTEIN: 7.6 g/dL (ref 6.5–8.1)

## 2018-11-09 LAB — CBC WITH DIFFERENTIAL/PLATELET
Abs Immature Granulocytes: 0 10*3/uL (ref 0.00–0.07)
BASOS PCT: 1 %
Basophils Absolute: 0.2 10*3/uL — ABNORMAL HIGH (ref 0.0–0.1)
EOS ABS: 0 10*3/uL (ref 0.0–0.5)
EOS PCT: 0 %
HCT: 34.6 % — ABNORMAL LOW (ref 39.0–52.0)
Hemoglobin: 10.1 g/dL — ABNORMAL LOW (ref 13.0–17.0)
Lymphocytes Relative: 5 %
Lymphs Abs: 1.2 10*3/uL (ref 0.7–4.0)
MCH: 24.6 pg — AB (ref 26.0–34.0)
MCHC: 29.2 g/dL — ABNORMAL LOW (ref 30.0–36.0)
MCV: 84.2 fL (ref 80.0–100.0)
MONO ABS: 0.5 10*3/uL (ref 0.1–1.0)
MONOS PCT: 2 %
Neutro Abs: 22.4 10*3/uL — ABNORMAL HIGH (ref 1.7–7.7)
Neutrophils Relative %: 92 %
Platelets: 771 10*3/uL — ABNORMAL HIGH (ref 150–400)
RBC: 4.11 MIL/uL — ABNORMAL LOW (ref 4.22–5.81)
RDW: 14.8 % (ref 11.5–15.5)
WBC: 24.3 10*3/uL — ABNORMAL HIGH (ref 4.0–10.5)
nRBC: 0 % (ref 0.0–0.2)
nRBC: 0 /100 WBC

## 2018-11-09 LAB — I-STAT CG4 LACTIC ACID, ED
Lactic Acid, Venous: 8.27 mmol/L (ref 0.5–1.9)
Lactic Acid, Venous: 8.05 mmol/L (ref 0.5–1.9)

## 2018-11-09 LAB — LACTIC ACID, PLASMA: Lactic Acid, Venous: 2.6 mmol/L (ref 0.5–1.9)

## 2018-11-09 MED ORDER — SODIUM CHLORIDE 0.9 % IV BOLUS (SEPSIS)
250.0000 mL | Freq: Once | INTRAVENOUS | Status: AC
  Administered 2018-11-09: 250 mL via INTRAVENOUS

## 2018-11-09 MED ORDER — SODIUM CHLORIDE 0.9 % IV BOLUS (SEPSIS)
1000.0000 mL | Freq: Once | INTRAVENOUS | Status: AC
  Administered 2018-11-09: 1000 mL via INTRAVENOUS

## 2018-11-09 MED ORDER — SODIUM CHLORIDE 0.9 % IV SOLN
2.0000 g | Freq: Once | INTRAVENOUS | Status: AC
  Administered 2018-11-09: 2 g via INTRAVENOUS
  Filled 2018-11-09: qty 2

## 2018-11-09 MED ORDER — VANCOMYCIN HCL IN DEXTROSE 1-5 GM/200ML-% IV SOLN
1000.0000 mg | INTRAVENOUS | Status: DC
  Filled 2018-11-09: qty 200

## 2018-11-09 MED ORDER — ENOXAPARIN SODIUM 40 MG/0.4ML ~~LOC~~ SOLN
40.0000 mg | SUBCUTANEOUS | Status: DC
  Administered 2018-11-09: 40 mg via SUBCUTANEOUS
  Filled 2018-11-09: qty 0.4

## 2018-11-09 MED ORDER — ACETAMINOPHEN 650 MG RE SUPP: 650.0000 mg | Freq: Four times a day (QID) | RECTAL | Status: DC | PRN

## 2018-11-09 MED ORDER — SODIUM CHLORIDE 0.9 % IV SOLN: 1000.0000 mL | INTRAVENOUS | Status: DC

## 2018-11-09 MED ORDER — IPRATROPIUM-ALBUTEROL 0.5-2.5 (3) MG/3ML IN SOLN
3.0000 mL | Freq: Four times a day (QID) | RESPIRATORY_TRACT | Status: DC
  Administered 2018-11-09 – 2018-11-10 (×5): 3 mL via RESPIRATORY_TRACT
  Filled 2018-11-09 (×5): qty 3

## 2018-11-09 MED ORDER — IPRATROPIUM-ALBUTEROL 0.5-2.5 (3) MG/3ML IN SOLN
3.0000 mL | RESPIRATORY_TRACT | Status: DC | PRN
  Administered 2018-11-11: 3 mL via RESPIRATORY_TRACT
  Filled 2018-11-09: qty 3

## 2018-11-09 MED ORDER — ACETAMINOPHEN 650 MG RE SUPP
650.0000 mg | Freq: Once | RECTAL | Status: AC
  Administered 2018-11-09: 650 mg via RECTAL
  Filled 2018-11-09: qty 1

## 2018-11-09 MED ORDER — PIPERACILLIN-TAZOBACTAM 3.375 G IVPB 30 MIN
3.3750 g | Freq: Three times a day (TID) | INTRAVENOUS | Status: DC
  Administered 2018-11-09 – 2018-11-10 (×2): 3.375 g via INTRAVENOUS
  Filled 2018-11-09 (×5): qty 50

## 2018-11-09 MED ORDER — SODIUM CHLORIDE 0.9 % IV SOLN
INTRAVENOUS | Status: DC
  Administered 2018-11-10: 01:00:00 via INTRAVENOUS

## 2018-11-09 MED ORDER — ONDANSETRON HCL 4 MG PO TABS: 4.0000 mg | ORAL_TABLET | Freq: Four times a day (QID) | ORAL | Status: DC | PRN

## 2018-11-09 MED ORDER — SODIUM CHLORIDE 0.9 % IV SOLN: 1.0000 g | INTRAVENOUS | Status: DC

## 2018-11-09 MED ORDER — VANCOMYCIN HCL IN DEXTROSE 1-5 GM/200ML-% IV SOLN
1000.0000 mg | Freq: Once | INTRAVENOUS | Status: AC
  Administered 2018-11-09: 1000 mg via INTRAVENOUS
  Filled 2018-11-09: qty 200

## 2018-11-09 MED ORDER — SODIUM CHLORIDE 0.9 % IV SOLN
1000.0000 mL | INTRAVENOUS | Status: DC
  Administered 2018-11-09: 1000 mL via INTRAVENOUS

## 2018-11-09 MED ORDER — SODIUM CHLORIDE 0.9 % IV BOLUS
500.0000 mL | Freq: Once | INTRAVENOUS | Status: AC
  Administered 2018-11-09: 500 mL via INTRAVENOUS

## 2018-11-09 MED ORDER — SODIUM CHLORIDE 0.9 % IV SOLN: 1.5000 g | Freq: Four times a day (QID) | INTRAVENOUS | Status: DC

## 2018-11-09 MED ORDER — ONDANSETRON HCL 4 MG/2ML IJ SOLN: 4.0000 mg | Freq: Four times a day (QID) | INTRAMUSCULAR | Status: DC | PRN

## 2018-11-09 MED ORDER — ACETAMINOPHEN 325 MG PO TABS: 650.0000 mg | ORAL_TABLET | Freq: Four times a day (QID) | ORAL | Status: DC | PRN

## 2018-11-09 NOTE — Progress Notes (Signed)
Pharmacy Antibiotic Note  Christopher Summers is a 77 y.o. male admitted on 10/26/2018 with pneumonia.  Pharmacy has been consulted for vancomycin and cefepime dosing. Pt with Tmax 102.5 and WBC is pending. Scr is elevated well above baseline and lactic acid is significantly elevated. First doses per EDP .  Plan: Vancomycin 1gm IV Q24H Cefepime 1gm IV Q24H F/u renal fxn, C&S, clinical status and trough at SS     Temp (24hrs), Avg:102.5 F (39.2 C), Min:102.5 F (39.2 C), Max:102.5 F (39.2 C)  Recent Labs  Lab 11/01/2018 1457  LATICACIDVEN 8.27*    CrCl cannot be calculated (Patient's most recent lab result is older than the maximum 21 days allowed.).    Allergies  Allergen Reactions  . Niacin And Related Other (See Comments)    Turned red all over  . Terazosin     From [facility]    Antimicrobials this admission: Vanc 11/27>> Cefepime 11/27>>  Dose adjustments this admission: N/A  Microbiology results: Pending  Thank you for allowing pharmacy to be a part of this patient's care.  Kalene Cutler, Rande Lawman 10/23/2018 3:04 PM

## 2018-11-09 NOTE — Progress Notes (Signed)
CRITICAL VALUE ALERT  Critical Value:  Lactic acid 2.6  Date & Time Notied:  11/01/2018 at 2250  Provider Notified: K schorr  Orders Received/Actions taken: Normal saline 500 ml bolus

## 2018-11-09 NOTE — ED Triage Notes (Signed)
Pt from 68% on RA, pt placed on 15 L nonrebreather at 90%, temp 101, CBG 202, 132/70, 142 irregular.  AMS. Rales and Rhonchi present. DNR, only responsive to painful stimuli.

## 2018-11-09 NOTE — ED Notes (Addendum)
MD Sabra Heck aware of oxygen saturations in the 60's-70's.  Per MD keep pt on non-rebreather as he is not alert enough for other interventions.  Per wife, pt would not want to be intubated. Will continue to monitor.

## 2018-11-09 NOTE — ED Provider Notes (Signed)
[facility] EMERGENCY DEPARTMENT Provider Note   CSN: [id number] Arrival date & time: 10/18/2018  1417     History   Chief Complaint Chief Complaint  Patient presents with  . Altered Mental Status    HPI Christopher Summers is a 77 y.o. male.  HPI  The patient is a 77 year old male, he has a known history of Alzheimer's disease and advanced dementia, history of essential hypertension, he is currently living in a nursing facility where he presents after the paramedics were called for respiratory distress and a fever.  The patient has had progressive altered mental status and was found to be febrile with severe tachypnea, hypoxia, low 60% on arrival, 90% on a nonrebreather in route to the hospital but no improvement in mental status.  The patient has DO NOT RESUSCITATE orders with him.  He is unable to give any information, level 5 caveat applies.  I spoke with the wife on the phone and states she is on her way to the hospital  Past Medical History:  Diagnosis Date  . Alzheimer's disease ([place])   . BPH (benign prostatic hypertrophy)   . Dementia ([place])   . Depression   . Essential hypertension, benign   . Reflux     Patient Active Problem List   Diagnosis Date Noted  . Boil 06/24/2017  . Abscess of leg 05/01/2017  . Right leg pain 10/19/2016  . Left leg pain 06/30/2016  . Abscess of axilla, left 06/15/2016  . Anxiety state 10/26/2014  . 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 Acute blood loss anemia 10/15/2014  . Hip fracture, right ([place]) 10/08/2014  . Alzheimer's dementia with behavioral disturbance ([place]) 10/08/2014  . Preoperative evaluation 10/08/2014  . Major depressive disorder, recurrent episode ([place]) 10/05/2007  . HYPERTENSION, BENIGN 10/05/2007  . CYSTITIS, ACUTE 10/05/2007  . BPH (benign prostatic hyperplasia) 10/05/2007    Past Surgical History:  Procedure Laterality Date  . HIP ARTHROPLASTY Right 10/09/2014   Procedure: Right  Hemiarthroplasty;  Surgeon: Rozanna Box, MD;  Location: [facility];  Service: Orthopedics;  Laterality: Right;  . TRANSURETHRAL RESECTION OF PROSTATE          Home Medications    Prior to Admission medications   Medication Sig Start Date End Date Taking? Authorizing Provider  divalproex (DEPAKOTE SPRINKLE) 125 MG capsule Take 250 mg by mouth daily. 10/28/18  Yes [provider]  finasteride (PROSCAR) 5 MG tablet TAKE 1 TABLET BY MOUTH DAILY Patient taking differently: Take 5 mg by mouth daily.  05/03/15  Yes Golden Circle, FNP  lisinopril (PRINIVIL,ZESTRIL) 2.5 MG tablet Take 2.5 mg by mouth daily. 11/07/18  Yes [provider]  mirtazapine (REMERON) 7.5 MG tablet Take 7.5 mg by mouth at bedtime. 10/20/18  Yes [provider]  QUEtiapine (SEROQUEL) 25 MG tablet Take 12.5 mg by mouth at bedtime.    Yes [provider]  sertraline (ZOLOFT) 50 MG tablet Take 50 mg by mouth daily.   Yes [provider]  traZODone (DESYREL) 50 MG tablet Take 25 mg by mouth at bedtime.    Yes [provider]  acetaminophen (TYLENOL) 160 MG/5ML liquid Take 15.6 mLs (500 mg total) by mouth every 4 (four) hours as needed for fever. 12/23/17   Lance Sell, NP  ALPRAZolam Duanne Moron) 0.5 MG tablet Take 1 tablet (0.5 mg total) as needed by mouth for anxiety (agitation). 10/18/17   Lance Sell, NP  donepezil (ARICEPT) 10 MG tablet Take  10 mg by mouth at bedtime.     [provider]  hydrOXYzine (ATARAX/VISTARIL) 10 MG tablet Take 10 mg by mouth 2 (two) times daily.     [provider]  memantine (NAMENDA) 10 MG tablet Take 10 mg by mouth daily.    [provider]  polyethylene glycol (MIRALAX / GLYCOLAX) packet Take 17 g by mouth daily. 07/07/16   Wynona Luna, MD    Family History Family History  Problem Relation Age of Onset  . Heart disease Brother   . Heart attack Father     Social History Social History    Tobacco Use  . Smoking status: Former Smoker    Packs/day: 0.50    Types: Cigarettes    Last attempt to quit: 10/09/2014    Years since quitting: 4.0  . Smokeless tobacco: Never Used  Substance Use Topics  . Alcohol use: No  . Drug use: No     Allergies   Niacin and related and Terazosin   Review of Systems Review of Systems  Unable to perform ROS: Mental status change     Physical Exam Updated Vital Signs BP (!) 164/134   Pulse (!) 36   Temp (!) 102.5 F (39.2 C) (Rectal)   Resp (!) 44   Ht 1.803 m (5\' 11" )   Wt 72.6 kg   SpO2 (!) 80%   BMI 22.32 kg/m   Physical Exam  Constitutional:  Distressed, dehydrated  HENT:  Very dry mucous membranes, dry cracked lips, atraumatic  Eyes:  Pupils are 3 mm and reactive bilaterally, no conjunctival injection or discharge  Neck:  Supple neck, no lymphadenopathy  Cardiovascular:  Tachycardic to 140 bpm, weak peripheral artery pulses, no edema, no JVD  Pulmonary/Chest:  Respiratory distress with a rapid respiratory rate of 40 breaths/min, shallow, rales and rhonchi diffusely  Abdominal:  Abdomen is soft and nontender  Musculoskeletal:  Vision sits in a flexion contractured state, he has skin breakdown over the bilateral buttocks right greater than left  Neurological:  Unresponsive to painful stimuli or voice, he is able to grab with both hands when he is turned on his side to stabilize himself on the rails  Skin:  Breakdown as above.     ED Treatments / Results  Labs (all labs ordered are listed, but only abnormal results are displayed) Labs Reviewed  COMPREHENSIVE METABOLIC PANEL - Abnormal; Notable for the following components:      Result Value   CO2 17 (*)    Glucose, Bld 181 (*)    BUN 29 (*)    Creatinine, Ser 1.70 (*)    Albumin 2.4 (*)    GFR calc non Af Amer 39 (*)    GFR calc Af Amer 45 (*)    Anion gap 17 (*)    All other components within normal limits  CBC WITH DIFFERENTIAL/PLATELET -  Abnormal; Notable for the following components:   RBC 4.11 (*)    Hemoglobin 10.1 (*)    HCT 34.6 (*)    MCH 24.6 (*)    MCHC 29.2 (*)    Platelets 771 (*)    All other components within normal limits  I-STAT CG4 LACTIC ACID, ED - Abnormal; Notable for the following components:   Lactic Acid, Venous 8.27 (*)    All other components within normal limits  CULTURE, BLOOD (ROUTINE X 2)  CULTURE, BLOOD (ROUTINE X 2)  URINALYSIS, ROUTINE W REFLEX MICROSCOPIC  I-STAT CG4 LACTIC ACID, ED  EKG None  Radiology Dg Chest Port 1 View  Result Date: 10/30/2018 CLINICAL DATA:  Cough respiratory distress EXAM: PORTABLE CHEST 1 VIEW COMPARISON:  01/07/2018 FINDINGS: Left lower lobe infiltrate has developed since the prior study. Mild volume loss left lower lobe with mediastinal shift to the left. No effusion. Right lung clear Atherosclerotic aortic arch.  Negative for heart failure. IMPRESSION: Left lower lobe infiltrate compatible with pneumonia. Electronically Signed   By: Franchot Gallo M.D.   On: 10/27/2018 15:07    Procedures .Critical Care Performed by: Noemi Chapel, MD Authorized by: Noemi Chapel, MD   Critical care provider statement:    Critical care time (minutes):  35   Critical care time was exclusive of:  Separately billable procedures and treating other patients and teaching time   Critical care was necessary to treat or prevent imminent or life-threatening deterioration of the following conditions:  Respiratory failure and sepsis   Critical care was time spent personally by me on the following activities:  Blood draw for specimens, development of treatment plan with patient or surrogate, discussions with consultants, evaluation of patient's response to treatment, examination of patient, obtaining history from patient or surrogate, ordering and performing treatments and interventions, ordering and review of laboratory studies, ordering and review of radiographic studies, pulse  oximetry, re-evaluation of patient's condition and review of old charts   (including critical care time)  Medications Ordered in ED Medications  0.9 %  sodium chloride infusion (1,000 mLs Intravenous New Bag/Given 11/03/2018 1457)  sodium chloride 0.9 % bolus 1,000 mL (1,000 mLs Intravenous New Bag/Given 11/02/2018 1551)    And  sodium chloride 0.9 % bolus 1,000 mL (has no administration in time range)    And  sodium chloride 0.9 % bolus 250 mL (has no administration in time range)  acetaminophen (TYLENOL) suppository 650 mg (has no administration in time range)  vancomycin (VANCOCIN) IVPB 1000 mg/200 mL premix (0 mg Intravenous Stopped 11/05/2018 1600)  ceFEPIme (MAXIPIME) 2 g in sodium chloride 0.9 % 100 mL IVPB (0 g Intravenous Stopped 10/15/2018 1542)     Initial Impression / Assessment and Plan / ED Course  I have reviewed the triage vital signs and the nursing notes.  Pertinent labs & imaging results that were available during my care of the patient were reviewed by me and considered in my medical decision making (see chart for details).  Clinical Course as of Nov 09 1604  Wed Nov 09, 2018  1453 Around 102.5, heart rate of 140, respiratory rate of 50 and an oxygen saturation of 73% on high flow nonrebreather.  I spoken with the wife at the bedside who agrees that we should do everything we can with IV fluids and antibiotics but she is agreeable that BiPAP or intubation is not appropriate and agrees to uphold the DO NOT RESUSCITATE order.  We will proceed with this treatment, discuss with inpatient team, I have personally viewed the x-ray and there does appear to be a developing left-sided pneumonia which explains the patient's symptoms.   [BM]  1548 Acute kidney injury present, lactic acid is greater than 8, IV fluids ordered at 30 cc/kg.  We will consult with admitting team for admission.  Wife states that the patient is in a nursing facility and seen by the nursing facility physician, she  does not know who it is   [BM]    Clinical Course User Index [BM] Noemi Chapel, MD   The patient is critically ill with what appears  to be severe sepsis, whether he aspirated her as bilateral pneumonia it is not clear however he is febrile, severely tachycardic tachypneic and hypoxic despite a nonrebreather, he is currently at approximately 75%.  He has DO NOT RESUSCITATE orders.  His mental status is such that he would not tolerate BiPAP safely without further aspirating or vomiting.  His wife is in route to the hospital, we will discuss further interventions at that time.  I do not think it would be right to intubate the patient at this time as I think that his prognosis is so grim that it would be unwise  Patient was most recently seen by family doctor in January, [place] -we will consult hospitalist for admission  Final Clinical Impressions(s) / ED Diagnoses   Final diagnoses:  Severe sepsis ([place])  HCAP (healthcare-associated pneumonia)      Noemi Chapel, MD 10/16/2018 1605

## 2018-11-09 NOTE — Progress Notes (Signed)
Took pt off NRB and placed pt on HFNC at 5L. Will wean O2 throughout the night if able.

## 2018-11-09 NOTE — H&P (Addendum)
History and Physical    DOA: 10/19/2018  PCP: Lance Sell, NP  Patient coming from: Skilled nursing facility  Chief Complaint: Acute respiratory failure  HPI: Christopher Summers is a 77 y.o. male with history h/o Alzheimer's dementia who is minimally communicative at baseline and mostly wheelchair-bound, depression, hypertension, GERD, BPH who resides at a skilled nursing facility transferred here with acute hypoxic respiratory failure.  Most of the history obtained by talking to wife and son at bedside.  According to wife, patient appeared to be coughing when she visited him over the weekend and was prescribed a cough syrup.  She states over the last few months she has also noticed that his swallowing had gotten slower and usually eats only 30 to 70% of meals with assistance.  He is on pured diet at nursing facility.  He apparently acutely declined this afternoon with fever, tachypnea and hypoxia. Patient on arrival here noted to be tachycardic with heart rate in 120s, febrile with temp 102.1, tachypneic with respiratory rate 35-42.  Labs showed white count of 24,000, AKI with creatinine at 1.7, lactate significantly elevated at 8.  Chest x-ray showed left lower lobe infiltrate.  Patient placed on 100% NRB a.m., received 2 L of IV fluids and antibiotics including cefepime/vancomycin.  After discussing with wife DNR status was confirmed.  Wife also understands his overall poor prognosis with low quality of life and requesting nonaggressive medical interventions.  Patient requested to be admitted for further evaluation and management.  Review of Systems: As per HPI otherwise 10 point review of systems negative.    Past Medical History:  Diagnosis Date  . Alzheimer's disease ([place])   . BPH (benign prostatic hypertrophy)   . Dementia ([place])   . Depression   . Essential hypertension, benign   . Reflux     Past Surgical History:  Procedure Laterality Date  . HIP ARTHROPLASTY Right  10/09/2014   Procedure: Right Hemiarthroplasty;  Surgeon: Rozanna Box, MD;  Location: [facility];  Service: Orthopedics;  Laterality: Right;  . TRANSURETHRAL RESECTION OF PROSTATE      Social history:  reports that he quit smoking about 4 years ago. His smoking use included cigarettes. He smoked 0.50 packs per day. He has never used smokeless tobacco. He reports that he does not drink alcohol or use drugs.   Allergies  Allergen Reactions  . Niacin And Related Other (See Comments)    Turned red all over  . Terazosin     From [facility]    Family History  Problem Relation Age of Onset  . Heart disease Brother   . Heart attack Father       Prior to Admission medications   Medication Sig Start Date End Date Taking? Authorizing Provider  acetaminophen (TYLENOL) 160 MG/5ML liquid Take 15.6 mLs (500 mg total) by mouth every 4 (four) hours as needed for fever. 12/23/17  Yes Lance Sell, NP  acetaminophen (TYLENOL) 325 MG tablet Take 650 mg by mouth every 4 (four) hours as needed for moderate pain.   Yes [provider]  cefTRIAXone (ROCEPHIN) 2 g injection Inject 2 g into the muscle once.   Yes [provider]  divalproex (DEPAKOTE SPRINKLE) 125 MG capsule Take 250 mg by mouth daily. 10/28/18  Yes [provider]  finasteride (PROSCAR) 5 MG tablet TAKE 1 TABLET BY MOUTH DAILY Patient taking differently: Take 5 mg by mouth daily.  05/03/15  Yes Golden Circle, FNP  guaiFENesin (ROBITUSSIN) 100  MG/5ML liquid Take 20 mLs by mouth 4 (four) times daily as needed for cough.   Yes [provider]  hydrOXYzine (ATARAX/VISTARIL) 10 MG tablet Take 10 mg by mouth every 6 (six) hours as needed for itching.    Yes [provider]  ipratropium-albuterol (DUONEB) 0.5-2.5 (3) MG/3ML SOLN Take 3 mLs by nebulization every 4 (four) hours as needed.   Yes [provider]  lisinopril (PRINIVIL,ZESTRIL) 2.5 MG tablet Take 2.5 mg by mouth daily. 11/07/18   Yes [provider]  mirtazapine (REMERON) 7.5 MG tablet Take 7.5 mg by mouth at bedtime. 10/20/18  Yes [provider]  OVER THE COUNTER MEDICATION Take 120 mLs by mouth 3 (three) times daily. Med Plus 2.0 For Nutrition Balance   Yes [provider]  QUEtiapine (SEROQUEL) 25 MG tablet Take 12.5 mg by mouth at bedtime.    Yes [provider]  sertraline (ZOLOFT) 50 MG tablet Take 50 mg by mouth daily.   Yes [provider]  traMADol (ULTRAM) 50 MG tablet Take 50 mg by mouth every 8 (eight) hours.   Yes [provider]  traZODone (DESYREL) 50 MG tablet Take 25 mg by mouth at bedtime.    Yes [provider]  Vitamin D, Ergocalciferol, (DRISDOL) 1.25 MG (50000 UT) CAPS capsule Take 50,000 Units by mouth every 7 (seven) days.   Yes [provider]  ALPRAZolam Duanne Moron) 0.5 MG tablet Take 1 tablet (0.5 mg total) as needed by mouth for anxiety (agitation). Patient not taking: Reported on 10/21/2018 10/18/17   Lance Sell, NP  polyethylene glycol (MIRALAX / GLYCOLAX) packet Take 17 g by mouth daily. Patient not taking: Reported on 11/10/2018 07/07/16   Wynona Luna, MD    Physical Exam: Vitals:   10/26/2018 1645 11/12/2018 1715 11/02/2018 1730 11/02/2018 1800  BP: (!) 157/53 (!) 114/92 (!) 117/54 117/65  Pulse: (!) 126 (!) 128 96 (!) 120  Resp: (!) 40 (!) 42 (!) 42 (!) 42  Temp:      TempSrc:      SpO2: 90% (!) 75% (!) 84% (!) 88%  Weight:      Height:         Vitals:   10/18/2018 1645 11/01/2018 1715 10/17/2018 1730 10/27/2018 1800  BP: (!) 157/53 (!) 114/92 (!) 117/54 117/65  Pulse: (!) 126 (!) 128 96 (!) 120  Resp: (!) 40 (!) 42 (!) 42 (!) 42  Temp:      TempSrc:      SpO2: 90% (!) 75% (!) 84% (!) 88%  Weight:      Height:      Constitutional: Patient lethargic, on 100% NRB M Eyes: PERRL, lids and conjunctivae normal ENMT: Mucous membranes are dry. Posterior pharynx clear of any exudate or lesions.Normal  dentition.  Neck: normal, supple, no masses, no thyromegaly Respiratory: Upper airway sounds with bibasilar crackles Cardiovascular: Regular rate and rhythm, no murmurs / rubs / gallops. No extremity edema. 2+ pedal pulses. No carotid bruits.  Abdomen:?  Mild diffuse tenderness (patient moaning during abdominal exam), no masses palpated. No hepatosplenomegaly. Bowel sounds positive.  Musculoskeletal: no clubbing / cyanosis. No joint deformity upper and lower extremities. Good ROM, no contractures. Normal muscle tone.  Neurologic: Moving all extremities could not assess further, lethargic and non verbal currently .  SKIN/catheters: no rashes, lesions, ulcers. No induration  Labs on Admission: I have personally reviewed following labs and imaging studies  CBC: Recent Labs  Lab 10/27/2018 1448  WBC  24.3*  NEUTROABS 22.4*  HGB 10.1*  HCT 34.6*  MCV 84.2  PLT 284*   Basic Metabolic Panel: Recent Labs  Lab 11/06/2018 1448  NA 145  K 4.0  CL 111  CO2 17*  GLUCOSE 181*  BUN 29*  CREATININE 1.70*  CALCIUM 9.0   GFR: Estimated Creatinine Clearance: 38.6 mL/min (A) (by C-G formula based on SCr of 1.7 mg/dL (H)). Liver Function Tests: Recent Labs  Lab 10/16/2018 1448  AST 31  ALT 27  ALKPHOS 94  BILITOT 0.4  PROT 7.6  ALBUMIN 2.4*   No results for input(s): LIPASE, AMYLASE in the last 168 hours. No results for input(s): AMMONIA in the last 168 hours. Coagulation Profile: No results for input(s): INR, PROTIME in the last 168 hours. Cardiac Enzymes: No results for input(s): CKTOTAL, CKMB, CKMBINDEX, TROPONINI in the last 168 hours. BNP (last 3 results) No results for input(s): PROBNP in the last 8760 hours. HbA1C: No results for input(s): HGBA1C in the last 72 hours. CBG: No results for input(s): GLUCAP in the last 168 hours. Lipid Profile: No results for input(s): CHOL, HDL, LDLCALC, TRIG, CHOLHDL, LDLDIRECT in the last 72 hours. Thyroid Function Tests: No results for  input(s): TSH, T4TOTAL, FREET4, T3FREE, THYROIDAB in the last 72 hours. Anemia Panel: No results for input(s): VITAMINB12, FOLATE, FERRITIN, TIBC, IRON, RETICCTPCT in the last 72 hours. Urine analysis:    Component Value Date/Time   COLORURINE YELLOW 01/07/2018 1116   APPEARANCEUR HAZY (A) 01/07/2018 1116   LABSPEC 1.024 01/07/2018 1116   PHURINE 5.0 01/07/2018 1116   GLUCOSEU NEGATIVE 01/07/2018 1116   GLUCOSEU NEGATIVE 11/30/2017 1106   HGBUR LARGE (A) 01/07/2018 1116   [place] 01/07/2018 1116   BILIRUBINUR negative 04/08/2015 [place] 01/07/2018 1116   PROTEINUR 30 (A) 01/07/2018 1116   UROBILINOGEN 0.2 11/30/2017 1106   NITRITE NEGATIVE 01/07/2018 1116   LEUKOCYTESUR NEGATIVE 01/07/2018 1116    Radiological Exams on Admission: Dg Chest Port 1 View  Result Date: 11/06/2018 CLINICAL DATA:  Cough respiratory distress EXAM: PORTABLE CHEST 1 VIEW COMPARISON:  01/07/2018 FINDINGS: Left lower lobe infiltrate has developed since the prior study. Mild volume loss left lower lobe with mediastinal shift to the left. No effusion. Right lung clear Atherosclerotic aortic arch.  Negative for heart failure. IMPRESSION: Left lower lobe infiltrate compatible with pneumonia. Electronically Signed   By: Franchot Gallo M.D.   On: 11/02/2018 15:07    EKG: Independently reviewed.  Not performed yet     Assessment and Plan:   1.  Severe sepsis/left lower lobe pneumonia: Patient received 2 L of IV fluid so far, systolic blood pressure around 110s.  He is still lethargic and on 100% NRB M saturating 91%.  Discussed with wife regarding overall poor prognosis.  At this time she would like to pursue IV fluids/IV antibiotics but no aggressive interventions like BiPAP, pressors, ICU transfers or intubation.  Blood cultures sent from ED.  Patient at high risk for aspiration although pneumonia seems to be more on the left side.  Will treat for H CAP as well as aspiration  organisms.  Watch renal function while on Zosyn plus vancomycin.  Given upper airway secretions, request periodic suctioning and n.p.o. status for now.  Continue IV hydration and repeat lactate levels per protocol.  Scheduled and PRN neb treatments.  Consider swallow evaluation in a.m. if more awake.  2.  AKI: IV hydration and labs in a.m.  3.  BPH: Hold medications  for now, watch urine output.  4.  Hypertension: Hold medications given n.p.o. status and problem #1.  Will have as needed IV medications available.  5. Advanced dementia: Overall poor quality of life even at baseline.  Family understanding and would like to consider comfort care if patient were to deteriorate further.  Patient on multiple psych medications at baseline which also puts him at risk for aspiration.  Will hold those for now as lethargic and n.p.o.   DVT prophylaxis: Lovenox  Code Status: DNR  Family Communication: Discussed with patient. Health care proxy would be his wife who could be reached at home number [phone] or cell phone [phone]. Consults called: Palliative care Admission status:  Patient admitted as inpatient as anticipated LOS greater than 2 midnights    Guilford Shi MD [facility] Pager [phone]  If 7PM-7AM, please contact night-coverage [email or web address] Password [facility]  11/12/2018, 6:19 PM

## 2018-11-10 DIAGNOSIS — J189 Pneumonia, unspecified organism: Secondary | ICD-10-CM

## 2018-11-10 DIAGNOSIS — Z515 Encounter for palliative care: Secondary | ICD-10-CM

## 2018-11-10 DIAGNOSIS — Z7189 Other specified counseling: Secondary | ICD-10-CM

## 2018-11-10 DIAGNOSIS — Z66 Do not resuscitate: Secondary | ICD-10-CM

## 2018-11-10 DIAGNOSIS — I10 Essential (primary) hypertension: Secondary | ICD-10-CM

## 2018-11-10 LAB — BASIC METABOLIC PANEL
ANION GAP: 8 (ref 5–15)
BUN: 35 mg/dL — ABNORMAL HIGH (ref 8–23)
CALCIUM: 8.1 mg/dL — AB (ref 8.9–10.3)
CO2: 21 mmol/L — ABNORMAL LOW (ref 22–32)
Chloride: 117 mmol/L — ABNORMAL HIGH (ref 98–111)
Creatinine, Ser: 1.48 mg/dL — ABNORMAL HIGH (ref 0.61–1.24)
GFR, EST AFRICAN AMERICAN: 53 mL/min — AB (ref >60)
GFR, EST NON AFRICAN AMERICAN: 46 mL/min — AB (ref >60)
Glucose, Bld: 133 mg/dL — ABNORMAL HIGH (ref 70–99)
Potassium: 3.6 mmol/L (ref 3.5–5.1)
SODIUM: 146 mmol/L — AB (ref 135–145)

## 2018-11-10 LAB — URINALYSIS, ROUTINE W REFLEX MICROSCOPIC
BILIRUBIN URINE: NEGATIVE
GLUCOSE, UA: NEGATIVE mg/dL
Ketones, ur: NEGATIVE mg/dL
NITRITE: NEGATIVE
PROTEIN: 30 mg/dL — AB
Specific Gravity, Urine: 1.021 (ref 1.005–1.030)
pH: 5 (ref 5.0–8.0)

## 2018-11-10 LAB — CBC
HCT: 27.8 % — ABNORMAL LOW (ref 39.0–52.0)
HEMOGLOBIN: 8.2 g/dL — AB (ref 13.0–17.0)
MCH: 24 pg — ABNORMAL LOW (ref 26.0–34.0)
MCHC: 29.5 g/dL — ABNORMAL LOW (ref 30.0–36.0)
MCV: 81.3 fL (ref 80.0–100.0)
PLATELETS: 599 10*3/uL — AB (ref 150–400)
RBC: 3.42 MIL/uL — ABNORMAL LOW (ref 4.22–5.81)
RDW: 14.8 % (ref 11.5–15.5)
WBC: 15.5 10*3/uL — AB (ref 4.0–10.5)
nRBC: 0 % (ref 0.0–0.2)

## 2018-11-10 LAB — LACTIC ACID, PLASMA
LACTIC ACID, VENOUS: 1.9 mmol/L (ref 0.5–1.9)
Lactic Acid, Venous: 1.4 mmol/L (ref 0.5–1.9)

## 2018-11-10 LAB — MRSA PCR SCREENING: MRSA BY PCR: POSITIVE — AB

## 2018-11-10 MED ORDER — HALOPERIDOL LACTATE 2 MG/ML PO CONC
0.5000 mg | ORAL | Status: DC | PRN
  Filled 2018-11-10: qty 0.3

## 2018-11-10 MED ORDER — CHLORHEXIDINE GLUCONATE CLOTH 2 % EX PADS
6.0000 | MEDICATED_PAD | Freq: Every day | CUTANEOUS | Status: DC
  Administered 2018-11-10: 6 via TOPICAL

## 2018-11-10 MED ORDER — ACETAMINOPHEN 325 MG PO TABS: 650.0000 mg | ORAL_TABLET | Freq: Four times a day (QID) | ORAL | Status: DC | PRN

## 2018-11-10 MED ORDER — BIOTENE DRY MOUTH MT LIQD: 15.0000 mL | OROMUCOSAL | Status: DC | PRN

## 2018-11-10 MED ORDER — HALOPERIDOL 0.5 MG PO TABS
0.5000 mg | ORAL_TABLET | ORAL | Status: DC | PRN
  Filled 2018-11-10: qty 1

## 2018-11-10 MED ORDER — RESOURCE THICKENUP CLEAR PO POWD
ORAL | Status: DC | PRN
  Filled 2018-11-10: qty 125

## 2018-11-10 MED ORDER — GLYCOPYRROLATE 0.2 MG/ML IJ SOLN: 0.2000 mg | INTRAMUSCULAR | Status: DC | PRN

## 2018-11-10 MED ORDER — BISACODYL 10 MG RE SUPP: 10.0000 mg | Freq: Every day | RECTAL | Status: DC | PRN

## 2018-11-10 MED ORDER — ACETAMINOPHEN 650 MG RE SUPP: 650.0000 mg | Freq: Four times a day (QID) | RECTAL | Status: DC | PRN

## 2018-11-10 MED ORDER — GLYCOPYRROLATE 1 MG PO TABS: 1.0000 mg | ORAL_TABLET | ORAL | Status: DC | PRN

## 2018-11-10 MED ORDER — MORPHINE SULFATE (PF) 2 MG/ML IV SOLN
1.0000 mg | INTRAVENOUS | Status: DC | PRN
  Administered 2018-11-11: 1 mg via INTRAVENOUS
  Filled 2018-11-10: qty 1

## 2018-11-10 MED ORDER — MUPIROCIN 2 % EX OINT
1.0000 "application " | TOPICAL_OINTMENT | Freq: Two times a day (BID) | CUTANEOUS | Status: DC
  Administered 2018-11-10: 1 via NASAL
  Filled 2018-11-10: qty 22

## 2018-11-10 MED ORDER — LACTATED RINGERS IV SOLN
INTRAVENOUS | Status: DC
  Administered 2018-11-10: 08:00:00 via INTRAVENOUS

## 2018-11-10 MED ORDER — HALOPERIDOL LACTATE 5 MG/ML IJ SOLN
0.5000 mg | INTRAMUSCULAR | Status: DC | PRN
  Administered 2018-11-11: 0.5 mg via INTRAVENOUS
  Filled 2018-11-10: qty 1

## 2018-11-10 MED ORDER — POLYVINYL ALCOHOL 1.4 % OP SOLN
1.0000 [drp] | Freq: Four times a day (QID) | OPHTHALMIC | Status: DC | PRN
  Filled 2018-11-10: qty 15

## 2018-11-10 NOTE — Progress Notes (Signed)
PROGRESS NOTE                                                                                                                                                                                                             Patient Demographics:    Christopher Summers, is a 77 y.o. male, DOB - [birth year], ZOX:[id number]  Admit date - 10/26/2018   Admitting Physician Guilford Shi, MD  Outpatient Primary MD for the patient is [facility]  LOS - 1  Chief Complaint  Patient presents with  . Altered Mental Status       Brief Narrative Christopher Summers is a 77 y.o. male with history h/o Alzheimer's dementia who is minimally communicative at baseline and mostly wheelchair-bound, depression, hypertension, GERD, BPH who resides at a skilled nursing facility transferred here with acute hypoxic respiratory failure, diagnosed with pneumonia with severe sepsis and admitted to the hospital.   Subjective:    Christopher Summers today in bed obtunded   Assessment  & Plan :      1.  Sepsis due to pneumonia could be aspiration in a patient with advanced dementia who is bedbound, does not communicate at baseline and unable to feed himself.  Extremely poor quality of life.  DNR at baseline with wife wanting medical care only and no heroics.  Had detailed discussions with wife he has had poor quality of life for several years and she does not want him to suffer anymore, at this time full comfort measures will be initiated.  We will stop all non-comfort medications.  We will arrange for residential hospice on November 21, 2018 if he survives today.  Comfort care order set initiated.    Family Communication  :  Wife  Code Status :  DNR  Disposition Plan  :  Residential Hospice on Nov 21, 2018  Consults  :  None  Procedures  :     DVT Prophylaxis  :  None  Lab Results  Component Value Date   PLT 599 (H) 11/10/2018    Diet :  Diet Order            Diet full liquid Room service  appropriate? Yes; Fluid consistency: Nectar Thick  Diet effective now               Inpatient Medications Scheduled Meds: . Chlorhexidine Gluconate Cloth  6 each Topical Q0600  . enoxaparin (LOVENOX) injection  40 mg Subcutaneous Q24H  . ipratropium-albuterol  3 mL Nebulization Q6H   Continuous  Infusions: PRN Meds:.acetaminophen **OR** acetaminophen, antiseptic oral rinse, bisacodyl, glycopyrrolate **OR** glycopyrrolate **OR** glycopyrrolate, haloperidol **OR** haloperidol **OR** haloperidol lactate, ipratropium-albuterol, morphine injection, [DISCONTINUED] ondansetron **OR** ondansetron (ZOFRAN) IV, polyvinyl alcohol  Antibiotics  :   Anti-infectives (From admission, onward)   Start     Dose/Rate Route Frequency Ordered Stop   11/10/18 1500  vancomycin (VANCOCIN) IVPB 1000 mg/200 mL premix  Status:  Discontinued     1,000 mg 200 mL/hr over 60 Minutes Intravenous Every 24 hours 11/04/2018 1618 11/10/18 0841   11/10/18 1500  ceFEPIme (MAXIPIME) 1 g in sodium chloride 0.9 % 100 mL IVPB  Status:  Discontinued     1 g 200 mL/hr over 30 Minutes Intravenous Every 24 hours 10/24/2018 1618 10/14/2018 1822   10/26/2018 2200  piperacillin-tazobactam (ZOSYN) IVPB 3.375 g  Status:  Discontinued     3.375 g 12.5 mL/hr over 240 Minutes Intravenous Every 8 hours 11/07/2018 1823 11/10/18 0841   10/17/2018 1830  ampicillin-sulbactam (UNASYN) 1.5 g in sodium chloride 0.9 % 100 mL IVPB  Status:  Discontinued     1.5 g 200 mL/hr over 30 Minutes Intravenous Every 6 hours 10/18/2018 1822 10/20/2018 1823   10/29/2018 1430  vancomycin (VANCOCIN) IVPB 1000 mg/200 mL premix     1,000 mg 200 mL/hr over 60 Minutes Intravenous  Once 11/01/2018 1427 11/10/2018 1600   11/03/2018 1430  ceFEPIme (MAXIPIME) 2 g in sodium chloride 0.9 % 100 mL IVPB     2 g 200 mL/hr over 30 Minutes Intravenous  Once 10/27/2018 1427 11/02/2018 1542          Objective:   Vitals:   11/10/18 0436 11/10/18 0738 11/10/18 0816 11/10/18 0855  BP:  (!)  143/71    Pulse:  (!) 107 99 98  Resp:  (!) 30 (!) 26 (!) 28  Temp: 99.5 F (37.5 C) 99.6 F (37.6 C)    TempSrc: Axillary Axillary    SpO2:  (!) 85% 96% 91%  Weight:      Height:        Wt Readings from Last 3 Encounters:  11/10/2018 53.9 kg  01/07/18 72.6 kg  12/23/17 72.6 kg     Intake/Output Summary (Last 24 hours) at 11/10/2018 1031 Last data filed at 11/10/2018 0615 Gross per 24 hour  Intake 1600 ml  Output 700 ml  Net 900 ml     Physical Exam  Patient in bed obtunded [name].AT,PERRAL Supple Neck,No JVD, No cervical lymphadenopathy appriciated.  Symmetrical Chest wall movement, Good air movement bilaterally, coarse bilateral breath sounds but left lower lobe Rales RRR,No Gallops,Rubs or new Murmurs, No Parasternal Heave +ve B.Sounds, Abd Soft, No tenderness, No organomegaly appriciated, No rebound - guarding or rigidity. No Cyanosis, Clubbing or edema, No new Rash or bruise      Data Review:    CBC Recent Labs  Lab 11/05/2018 1448 11/10/18 0358  WBC 24.3* 15.5*  HGB 10.1* 8.2*  HCT 34.6* 27.8*  PLT 771* 599*  MCV 84.2 81.3  MCH 24.6* 24.0*  MCHC 29.2* 29.5*  RDW 14.8 14.8  LYMPHSABS 1.2  --   MONOABS 0.5  --   EOSABS 0.0  --   BASOSABS 0.2*  --     Chemistries  Recent Labs  Lab 10/29/2018 1448 11/10/18 0358  NA 145 146*  K 4.0 3.6  CL 111 117*  CO2 17* 21*  GLUCOSE 181* 133*  BUN 29* 35*  CREATININE 1.70* 1.48*  CALCIUM 9.0 8.1*  AST 31  --  ALT 27  --   ALKPHOS 94  --   BILITOT 0.4  --    ------------------------------------------------------------------------------------------------------------------ No results for input(s): CHOL, HDL, LDLCALC, TRIG, CHOLHDL, LDLDIRECT in the last 72 hours.  No results found for: HGBA1C ------------------------------------------------------------------------------------------------------------------ No results for input(s): TSH, T4TOTAL, T3FREE, THYROIDAB in the last 72 hours.  Invalid input(s):  FREET3 ------------------------------------------------------------------------------------------------------------------ No results for input(s): VITAMINB12, FOLATE, FERRITIN, TIBC, IRON, RETICCTPCT in the last 72 hours.  Coagulation profile No results for input(s): INR, PROTIME in the last 168 hours.  No results for input(s): DDIMER in the last 72 hours.  Cardiac Enzymes No results for input(s): CKMB, TROPONINI, MYOGLOBIN in the last 168 hours.  Invalid input(s): CK ------------------------------------------------------------------------------------------------------------------ No results found for: BNP  Micro Results Recent Results (from the past 240 hour(s))  Blood Culture (routine x 2)     Status: None (Preliminary result)   Collection Time: 10/20/2018  2:41 PM  Result Value Ref Range Status   Specimen Description BLOOD RIGHT ANTECUBITAL  Final   Special Requests   Final    BOTTLES DRAWN AEROBIC AND ANAEROBIC Blood Culture adequate volume   Culture   Final    NO GROWTH < 24 HOURS Performed at [facility], [address], [place], [place] [address]    Report Status PENDING  Incomplete  Blood Culture (routine x 2)     Status: None (Preliminary result)   Collection Time: 10/26/2018  2:45 PM  Result Value Ref Range Status   Specimen Description BLOOD RIGHT HAND  Final   Special Requests   Final    BOTTLES DRAWN AEROBIC AND ANAEROBIC Blood Culture results may not be optimal due to an inadequate volume of blood received in culture bottles   Culture   Final    NO GROWTH < 24 HOURS Performed at [facility], [place] [address], [place], [place] [address]    Report Status PENDING  Incomplete  MRSA PCR Screening     Status: Abnormal   Collection Time: 11/10/2018  9:51 PM  Result Value Ref Range Status   MRSA by PCR POSITIVE (A) NEGATIVE Final    Comment:        The GeneXpert MRSA Assay (FDA approved for NASAL specimens only), is one component of a comprehensive MRSA  colonization surveillance program. It is not intended to diagnose MRSA infection nor to guide or monitor treatment for MRSA infections. RESULT CALLED TO, READ BACK BY AND VERIFIED WITHDeloria Lair RN [id number] 11/10/18 A BROWNING Performed at [facility], [place] [address] [place], [place] [address]     Radiology Reports Dg Chest Port 1 View  Result Date: 10/31/2018 CLINICAL DATA:  Cough respiratory distress EXAM: PORTABLE CHEST 1 VIEW COMPARISON:  01/07/2018 FINDINGS: Left lower lobe infiltrate has developed since the prior study. Mild volume loss left lower lobe with mediastinal shift to the left. No effusion. Right lung clear Atherosclerotic aortic arch.  Negative for heart failure. IMPRESSION: Left lower lobe infiltrate compatible with pneumonia. Electronically Signed   By: Franchot Gallo M.D.   On: 10/26/2018 15:07    Time Spent in minutes  30   Lala Lund M.D on 11/10/2018 at 10:31 AM  To page go to [email or web address] - password [facility]

## 2018-11-10 NOTE — Consult Note (Signed)
Consultation Note Date: 11/10/2018   Patient Name: Christopher Summers  DOB: December 06, 1941  MRN: [id number]  Age / Sex: 77 y.o., male  PCP: Care, [place] Referring Physician: Thurnell Lose, MD  Reason for Consultation: Establishing goals of care  HPI/Patient Profile: 77 y.o. male admitted on 10/23/2018 from SNF with complaints of hypoxia and altered mental status. He has a past medical history significant of Alzheimer's dementia, wheelchair-bound, hypertension, BPH, depression, and GERD. Family was at bedside during ED course and provided history. Wife reported noticeable delay and slowing of swallowing over the past several months. He was on a pureed diet prior to admission. Patient was tachycardic (120s), febrile 102.1, tachypneic (35-42), WBC 24.3, CR 1.7, lactic acid 8.27, BUN 29, Albumin 2.4, Hgb 10.1, Chest x-ray showed left lower lobe infiltrate. He was started on cefepime and vancomycin. Since admission patient continues to show signs of decline despite medical intervention. Palliative Medicine was originally consulted for goals of care discussion.   Clinical Assessment and Goals of Care: I have reviewed medical records including lab results, imaging, Epic notes, and MAR, received report from the bedside RN, and assessed the patient. I then met at the bedside with patient's wife, Christopher Summers and son Christopher Summers and his wife  to discuss diagnosis prognosis, GOC, EOL wishes, disposition and options. Patient remains lethargic and unresponsive. He is unable to engage in goals of care discussion.   I introduced Palliative Medicine as specialized medical care for people living with serious illness. It focuses on providing relief from the symptoms and stress of a serious illness. The goal is to improve quality of life for both the patient and the family.  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 He was forced  to retire in 2004 after beginning to show signs of dementia. He has 2 sons, Christopher Summers and Christopher Summers. He and his wife have been married for 52 years.   Wife reports patient has continued to show decline in his health over the past several months. She states he is minimally verbal and wheelchair bound. He requires total care. She reports noticeable difficulty and slowing to swallow.   We discussed his current illness and what it means in the larger context of his on-going co-morbidities.  Natural disease trajectory and expectations at EOL were discussed. Wife has recently discussed patient's care and decline with Dr. Candiss Norse. She and her son verbalized understanding of his condition specifically pneumonia and progression of dementia.   I attempted to elicit values and goals of care important to the patient and family.   Wife expressed wishes as discussed with Dr. Candiss Norse to proceed with comfort care measures for end-of-life. She had questions regarding medications and symptom management in the event patient is unable to express needs. I spent a detailed amount of time educating family that nursing staff will frequently assess patient for nonverbal signs such as changes in breathing, shortness of breath, fidgeting, signs of agitation, brow furring, moaning, or increased heart rate as signs of distress. Nursing staff will administer medications  for comfort based on assessed symptoms. Family verbalized understanding and appreciation.   Wife confirmed DNR/DNI and again expressed wishes to proceed with comfort care measures.   Hospice services outpatient were explained. Family inquired about all available options and goals of hospice care. We discussed options of returning to SNF with outpatient hospice, home with hospice, and also residential hospice. Family expressed interest in hospice home. We discussed goals and expectations of care at the local hospice home. Family verbalized understanding and appreciation.    Questions and concerns were addressed. The family was encouraged to call with questions or concerns.  PMT will continue to support holistically.   Primary Decision Maker NEXT OF KIN/WIFE: Christopher Summers    SUMMARY OF RECOMMENDATIONS    DNR/DNI-as confirmed  Full Comfort Care-as discussed with Dr. Candiss Norse previously. We discussed further in detail and wife feels more comfortable with decision to transfer to residential hospice.   Agree with CSW referral for hospice home placement  Comfort Cart for family  Family requesting chaplain support  Agree with:  Zofran PRN for nausea   Haldol PRN for agitation/anxiety  Morphine PRN for pain/air hunger  Code Status/Advance Care Planning:  DNR   Symptom Management:   Zofran PRN for nausea   Haldol PRN for agitation/anxiety  Morphine PRN for pain/air hunger  Palliative Prophylaxis:   Aspiration, Frequent Pain Assessment, Oral Care and Turn Reposition  Additional Recommendations (Limitations, Scope, Preferences):  Full Comfort Care  Psycho-social/Spiritual:   Desire for further Chaplaincy support:YES  Prognosis:   < 2 weeks-in the setting of full comfort care, advanced Alzheimer's dementia, poor po intake, wheelchair bound, sepsis, hypertension, pneumonia, and BPH.   Discharge Planning: Hospice facility      Primary Diagnoses: Present on Admission: . Sepsis ([place]) . HYPERTENSION, BENIGN   I have reviewed the medical record, interviewed the patient and family, and examined the patient. The following aspects are pertinent.  Past Medical History:  Diagnosis Date  . Acute hypoxemic respiratory failure ([place])    Archie Endo 10/15/2018  . Alzheimer's dementia ([place])    Archie Endo 10/26/2018  . Alzheimer's disease ([place])   . BPH (benign prostatic hypertrophy)   . Depression   . DVT (deep venous thrombosis) ([place])    ?RLE"  . Essential hypertension, benign   . GERD (gastroesophageal reflux disease)   . Pneumonia ~  07/2018; 11/12/2018   Social History   Socioeconomic History  . Marital status: Married    Spouse name: Not on file  . Number of children: 2  . Years of education: 83  . Highest education level: Not on file  Occupational History  . Occupation: Retired  Scientific laboratory technician  . Financial resource strain: Not on file  . Food insecurity:    Worry: Not on file    Inability: Not on file  . Transportation needs:    Medical: Not on file    Non-medical: Not on file  Tobacco Use  . Smoking status: Former Smoker    Packs/day: 0.50    Years: 53.00    Pack years: 26.50    Types: Cigarettes    Last attempt to quit: 10/09/2014    Years since quitting: 4.0  . Smokeless tobacco: Never Used  Substance and Sexual Activity  . Alcohol use: Not Currently    Comment: 10/30/2018 "none in 50 years"  . Drug use: Never  . Sexual activity: Not on file  Lifestyle  . Physical activity:    Days per week: Not on file  Minutes per session: Not on file  . Stress: Not on file  Relationships  . Social connections:    Talks on phone: Not on file    Gets together: Not on file    Attends religious service: Not on file    Active member of club or organization: Not on file    Attends meetings of clubs or organizations: Not on file    Relationship status: Not on file  Other Topics Concern  . Not on file  Social History Narrative   Fun: Likes to stay at home.   Denies any religious beliefs effecting health care.    Family History  Problem Relation Age of Onset  . Heart disease Brother   . Heart attack Father    Scheduled Meds: . Chlorhexidine Gluconate Cloth  6 each Topical Q0600  . ipratropium-albuterol  3 mL Nebulization Q6H   Continuous Infusions: PRN Meds:.acetaminophen **OR** acetaminophen, antiseptic oral rinse, bisacodyl, glycopyrrolate **OR** glycopyrrolate **OR** glycopyrrolate, haloperidol **OR** haloperidol **OR** haloperidol lactate, ipratropium-albuterol, morphine injection, [DISCONTINUED]  ondansetron **OR** ondansetron (ZOFRAN) IV, polyvinyl alcohol Medications Prior to Admission:  Prior to Admission medications   Medication Sig Start Date End Date Taking? Authorizing Provider  acetaminophen (TYLENOL) 160 MG/5ML liquid Take 15.6 mLs (500 mg total) by mouth every 4 (four) hours as needed for fever. 12/23/17  Yes Lance Sell, NP  acetaminophen (TYLENOL) 325 MG tablet Take 650 mg by mouth every 4 (four) hours as needed for moderate pain.   Yes [provider]  cefTRIAXone (ROCEPHIN) 2 g injection Inject 2 g into the muscle once.   Yes [provider]  divalproex (DEPAKOTE SPRINKLE) 125 MG capsule Take 250 mg by mouth daily. 10/28/18  Yes [provider]  finasteride (PROSCAR) 5 MG tablet TAKE 1 TABLET BY MOUTH DAILY Patient taking differently: Take 5 mg by mouth daily.  05/03/15  Yes Golden Circle, FNP  guaiFENesin (ROBITUSSIN) 100 MG/5ML liquid Take 20 mLs by mouth 4 (four) times daily as needed for cough.   Yes [provider]  hydrOXYzine (ATARAX/VISTARIL) 10 MG tablet Take 10 mg by mouth every 6 (six) hours as needed for itching.    Yes [provider]  ipratropium-albuterol (DUONEB) 0.5-2.5 (3) MG/3ML SOLN Take 3 mLs by nebulization every 4 (four) hours as needed.   Yes [provider]  lisinopril (PRINIVIL,ZESTRIL) 2.5 MG tablet Take 2.5 mg by mouth daily. 11/07/18  Yes [provider]  mirtazapine (REMERON) 7.5 MG tablet Take 7.5 mg by mouth at bedtime. 10/20/18  Yes [provider]  OVER THE COUNTER MEDICATION Take 120 mLs by mouth 3 (three) times daily. Med Plus 2.0 For Nutrition Balance   Yes [provider]  QUEtiapine (SEROQUEL) 25 MG tablet Take 12.5 mg by mouth at bedtime.    Yes [provider]  sertraline (ZOLOFT) 50 MG tablet Take 50 mg by mouth daily.   Yes [provider]  traMADol (ULTRAM) 50 MG tablet Take 50 mg by mouth every 8 (eight) hours.   Yes  [provider]  traZODone (DESYREL) 50 MG tablet Take 25 mg by mouth at bedtime.    Yes [provider]  Vitamin D, Ergocalciferol, (DRISDOL) 1.25 MG (50000 UT) CAPS capsule Take 50,000 Units by mouth every 7 (seven) days.   Yes [provider]  ALPRAZolam Duanne Moron) 0.5 MG tablet Take 1 tablet (0.5 mg total) as needed by mouth for anxiety (agitation). Patient not taking: Reported on 10/23/2018 10/18/17   Gayla Medicus,  Delphia Grates, NP  polyethylene glycol (MIRALAX / GLYCOLAX) packet Take 17 g by mouth daily. Patient not taking: Reported on 10/31/2018 07/07/16   Wynona Luna, MD   Allergies  Allergen Reactions  . Niacin And Related Other (See Comments)    Turned red all over  . Terazosin     From MAR   Review of Systems  Unable to perform ROS: Dementia    Physical Exam  Constitutional: Vital signs are normal. He appears cachectic. He has a sickly appearance.  Thin, frail, chronically ill appearing   Cardiovascular: Normal rate, regular rhythm and normal heart sounds. Exam reveals decreased pulses.  Pulmonary/Chest: Effort normal. He has decreased breath sounds. He has rales.  Abdominal: Soft. Normal appearance. Bowel sounds are decreased.  Neurological: He is unresponsive.  Minimally responsive   Skin: Skin is warm and dry.  Psychiatric: Cognition and memory are impaired. He expresses inappropriate judgment.  Nursing note and vitals reviewed.   Vital Signs: BP (!) 143/71   Pulse 98   Temp 99.6 F (37.6 C) (Axillary)   Resp (!) 28   Ht '5\' 11"'$  (1.803 m)   Wt 53.9 kg   SpO2 91%   BMI 16.57 kg/m  Pain Scale: Faces       SpO2: SpO2: 91 % O2 Device:SpO2: 91 % O2 Flow Rate: .O2 Flow Rate (L/min): 8 L/min  IO: Intake/output summary:   Intake/Output Summary (Last 24 hours) at 11/10/2018 1111 Last data filed at 11/10/2018 0615 Gross per 24 hour  Intake 1600 ml  Output 700 ml  Net 900 ml    LBM:   Baseline Weight: Weight: 72.6 kg Most  recent weight: Weight: 53.9 kg     Palliative Assessment/Data: PPS 10%     Time In: 1030 Time Out: 1135 Time Total: 65 min  Greater than 50%  of this time was spent counseling and coordinating care related to the above assessment and plan.  Signed by:  Alda Lea, AGPCNP-BC Palliative Medicine Team  Phone: [phone] Fax: [phone] Pager: [phone] Amion: Bjorn Pippin    Please contact Palliative Medicine Team phone at [phone] for questions and concerns.  For individual provider: See Shea Evans

## 2018-11-10 NOTE — Progress Notes (Signed)
Patient received from ED via bed. Patient response to voice. Vital signs are stable. Iv in place and running fluid. Patient is on bed side monitor. Skin assessment done with another nurse. Patient and family given instructions about phone and call bell. Bed in low position and side rail up x 3. Call bell in reach.

## 2018-11-10 NOTE — Evaluation (Signed)
SLP Cancellation Note  Patient Details Name: Christopher Summers MRN: [id number] DOB: 1940/12/30   Cancelled treatment:       Reason Eval/Treat Not Completed: Fatigue/lethargy limiting ability to participate  Pt not alert adequately for po, congested breathing noted - gurgling audibly at larynx.  Concern for secretion aspiration.  Wife, daughter and son present and wife states pt has been having problems swallowing and is on a puree diet.  Advised them to npo rec and raise HOB when pt awakens to facilitate airway clearance.  Will continue efforts.   Luanna Salk, MS [facility] SLP Acute Rehab Services Pager [phone] Office [phone]   Macario Golds 11/10/2018, 10:22 AM

## 2018-11-11 MED ORDER — LORAZEPAM 2 MG/ML IJ SOLN
INTRAMUSCULAR | Status: AC
  Filled 2018-11-11: qty 1

## 2018-11-11 MED ORDER — LORAZEPAM 2 MG/ML IJ SOLN: 1.0000 mg | INTRAMUSCULAR | Status: DC | PRN

## 2018-11-13 NOTE — Progress Notes (Signed)
Patient deceased at 83. Verified no pulse upon palpation or auscultation. No respirations visible or auscultated. Patient voided bowels. Patient's wife at bedside. Patient prepped and placed in body bag and death certificate placed in body bag with patient. Security notified and patient taken to morgue by nurse techs and escorted by security.

## 2018-11-13 DEATH — deceased

## 2018-11-14 LAB — CULTURE, BLOOD (ROUTINE X 2)
CULTURE: NO GROWTH
CULTURE: NO GROWTH
Special Requests: ADEQUATE

## 2018-12-14 NOTE — Discharge Summary (Signed)
[facility] Death Note                                                                                                                                                                                               Christopher Summers, is a 78 y.o. male, DOB - Feb 17, 1941, [place] date - 10/30/2018   Admitting Physician Guilford Shi, MD  Outpatient Primary MD for the patient is Care, [place]  LOS - 2  Chief Complaint  Patient presents with  . Altered Mental Status       Notification: Care, [place] notified of death of 12-08-2018   Date and Time of Death - 12/04/17 @ 03:16  Pronounced by - RN  History of present illness:   Christopher Summers is a 78 y.o. male with a history of - Sepsis due to pneumonia could be aspiration in a patient with advanced dementia who is bedbound, does not communicate at baseline and unable to feed himself.  Extremely poor quality of life.  DNR at baseline with wife wanting medical care only and no heroics.  Had detailed discussions with wife he has had poor quality of life for several years and she does not want him to suffer anymore, at this time full comfort measures will be initiated.  We will stop all non-comfort medications.  We will arrange for residential hospice on 12/04/18 if he survives today.  Comfort care order set initiated.   Final Diagnoses:  Cause if death - Sepsis  Signature  Lala Lund M.D on 12-08-2018 at 12:11 PM  [facility]  Office Phone [phone]  Total clinical and documentation time for today Under 30 minutes   Last Note              PROGRESS NOTE  Patient Demographics:    Christopher Summers,  is a 78 y.o. male, DOB - 01/11/1941, WFU:[id number]  Admit date - 10/19/2018   Admitting Physician Guilford Shi, MD  Outpatient Primary MD for the patient is Care, [place]  LOS - 2  Chief Complaint  Patient presents with  . Altered Mental Status       Brief Narrative Christopher Summers is a 78 y.o. male with history h/o Alzheimer's dementia who is minimally communicative at baseline and mostly wheelchair-bound, depression, hypertension, GERD, BPH who resides at a skilled nursing facility transferred here with acute hypoxic respiratory failure, diagnosed with pneumonia with severe sepsis and admitted to the hospital.   Subjective:    Christopher Summers today in bed obtunded   Assessment  & Plan :      1.  Sepsis due to pneumonia could be aspiration in a patient with advanced dementia who is bedbound, does not communicate at baseline and unable to feed himself.  Extremely poor quality of life.  DNR at baseline with wife wanting medical care only and no heroics.  Had detailed discussions with wife he has had poor quality of life for several years and she does not want him to suffer anymore, at this time full comfort measures will be initiated.  We will stop all non-comfort medications.  We will arrange for residential hospice on 11/25/18 if he survives today.  Comfort care order set initiated.    Family Communication  :  Wife  Code Status :  DNR  Disposition Plan  :  Residential Hospice on 11/25/2018  Consults  :  None  Procedures  :     DVT Prophylaxis  :  None  Lab Results  Component Value Date   PLT 599 (H) 11/10/2018    Diet :  Diet Order    None       Inpatient Medications Scheduled Meds:  Continuous Infusions: PRN Meds:.  Antibiotics  :   Anti-infectives (From admission, onward)   Start     Dose/Rate Route Frequency Ordered Stop   11/10/18 1500  vancomycin (VANCOCIN) IVPB 1000 mg/200 mL premix  Status:  Discontinued     1,000 mg 200 mL/hr over  60 Minutes Intravenous Every 24 hours 11/08/2018 1618 11/10/18 0841   11/10/18 1500  ceFEPIme (MAXIPIME) 1 g in sodium chloride 0.9 % 100 mL IVPB  Status:  Discontinued     1 g 200 mL/hr over 30 Minutes Intravenous Every 24 hours 10/26/2018 1618 11/10/2018 1822   11/05/2018 2200  piperacillin-tazobactam (ZOSYN) IVPB 3.375 g  Status:  Discontinued     3.375 g 12.5 mL/hr over 240 Minutes Intravenous Every 8 hours 10/14/2018 1823 11/10/18 0841   10/16/2018 1830  ampicillin-sulbactam (UNASYN) 1.5 g in sodium chloride 0.9 % 100 mL IVPB  Status:  Discontinued     1.5 g 200 mL/hr over 30 Minutes Intravenous Every 6 hours 10/17/2018 1822 11/10/2018 1823   10/25/2018 1430  vancomycin (VANCOCIN) IVPB 1000 mg/200 mL premix     1,000 mg 200 mL/hr over 60 Minutes Intravenous  Once 10/24/2018 1427 10/22/2018 1600   10/21/2018 1430  ceFEPIme (MAXIPIME) 2 g in sodium chloride 0.9 % 100 mL IVPB     2 g 200 mL/hr over 30 Minutes Intravenous  Once 11/10/2018 1427 10/23/2018 1542          Objective:   Vitals:   11/10/18 0855 11/10/18 1429 11/10/18 1900 11-25-18 0400  BP:      Pulse: 98 97  Resp: (!) 28 (!) 26    Temp:      TempSrc:      SpO2: 91% 92% 94%   Weight:    51.7 kg  Height:    5\' 11"  (1.803 m)    Wt Readings from Last 3 Encounters:  11/15/2018 51.7 kg  01/07/18 72.6 kg  12/23/17 72.6 kg    No intake or output data in the 24 hours ending 11/15/18 1212   Physical Exam  Patient in bed obtunded [place].AT,PERRAL Supple Neck,No JVD, No cervical lymphadenopathy appriciated.  Symmetrical Chest wall movement, Good air movement bilaterally, coarse bilateral breath sounds but left lower lobe Rales RRR,No Gallops,Rubs or new Murmurs, No Parasternal Heave +ve B.Sounds, Abd Soft, No tenderness, No organomegaly appriciated, No rebound - guarding or rigidity. No Cyanosis, Clubbing or edema, No new Rash or bruise      Data Review:    CBC Recent Labs  Lab 10/29/2018 1448 11/10/18 0358  WBC 24.3* 15.5*  HGB 10.1*  8.2*  HCT 34.6* 27.8*  PLT 771* 599*  MCV 84.2 81.3  MCH 24.6* 24.0*  MCHC 29.2* 29.5*  RDW 14.8 14.8  LYMPHSABS 1.2  --   MONOABS 0.5  --   EOSABS 0.0  --   BASOSABS 0.2*  --     Chemistries  Recent Labs  Lab 10/16/2018 1448 11/10/18 0358  NA 145 146*  K 4.0 3.6  CL 111 117*  CO2 17* 21*  GLUCOSE 181* 133*  BUN 29* 35*  CREATININE 1.70* 1.48*  CALCIUM 9.0 8.1*  AST 31  --   ALT 27  --   ALKPHOS 94  --   BILITOT 0.4  --    ------------------------------------------------------------------------------------------------------------------ No results for input(s): CHOL, HDL, LDLCALC, TRIG, CHOLHDL, LDLDIRECT in the last 72 hours.  No results found for: HGBA1C ------------------------------------------------------------------------------------------------------------------ No results for input(s): TSH, T4TOTAL, T3FREE, THYROIDAB in the last 72 hours.  Invalid input(s): FREET3 ------------------------------------------------------------------------------------------------------------------ No results for input(s): VITAMINB12, FOLATE, FERRITIN, TIBC, IRON, RETICCTPCT in the last 72 hours.  Coagulation profile No results for input(s): INR, PROTIME in the last 168 hours.  No results for input(s): DDIMER in the last 72 hours.  Cardiac Enzymes No results for input(s): CKMB, TROPONINI, MYOGLOBIN in the last 168 hours.  Invalid input(s): CK ------------------------------------------------------------------------------------------------------------------ No results found for: BNP  Micro Results Recent Results (from the past 240 hour(s))  Blood Culture (routine x 2)     Status: None   Collection Time: 10/26/2018  2:41 PM  Result Value Ref Range Status   Specimen Description BLOOD RIGHT ANTECUBITAL  Final   Special Requests   Final    BOTTLES DRAWN AEROBIC AND ANAEROBIC Blood Culture adequate volume   Culture   Final    NO GROWTH 5 DAYS Performed at [facility], [address], [place], [place] [address]    Report Status 11/14/2018 FINAL  Final  Blood Culture (routine x 2)     Status: None   Collection Time: 11/02/2018  2:45 PM  Result Value Ref Range Status   Specimen Description BLOOD RIGHT HAND  Final   Special Requests   Final    BOTTLES DRAWN AEROBIC AND ANAEROBIC Blood Culture results may not be optimal due to an inadequate volume of blood received in culture bottles   Culture   Final    NO GROWTH 5 DAYS Performed at [facility], [place] [address] [place],  [address]    Report Status 11/14/2018 FINAL  Final  MRSA PCR Screening     Status: Abnormal   Collection Time: 11/08/2018  9:51 PM  Result Value Ref Range Status   MRSA by PCR POSITIVE (A) NEGATIVE Final    Comment:        The GeneXpert MRSA Assay (FDA approved for NASAL specimens only), is one component of a comprehensive MRSA colonization surveillance program. It is not intended to diagnose MRSA infection nor to guide or monitor treatment for MRSA infections. RESULT CALLED TO, READ BACK BY AND VERIFIED WITHDeloria Lair RN [id number] 11/10/18 A BROWNING Performed at [facility], [place] [address], [place], [place] [address]     Radiology Reports Dg Chest Port 1 View  Result Date: 10/17/2018 CLINICAL DATA:  Cough respiratory distress EXAM: PORTABLE CHEST 1 VIEW COMPARISON:  01/07/2018 FINDINGS: Left lower lobe infiltrate has developed since the prior study. Mild volume loss left lower lobe with mediastinal shift to the left. No effusion. Right lung clear Atherosclerotic aortic arch.  Negative for heart failure. IMPRESSION: Left lower lobe infiltrate compatible with pneumonia. Electronically Signed   By: Franchot Gallo M.D.   On: 10/21/2018 15:07    Time Spent in minutes  30   Lala Lund M.D on 11/15/2018 at 12:12 PM  To page go to [email or web address] - password [facility]
